# Patient Record
Sex: Male | Born: 1955
Health system: Southern US, Community
[De-identification: ages and names within clinical notes are randomized; demographics above are authoritative.]

## PROBLEM LIST (undated history)

## (undated) DIAGNOSIS — I2510 Atherosclerotic heart disease of native coronary artery without angina pectoris: Secondary | ICD-10-CM

## (undated) DIAGNOSIS — E785 Hyperlipidemia, unspecified: Secondary | ICD-10-CM

## (undated) DIAGNOSIS — Z9109 Other allergy status, other than to drugs and biological substances: Secondary | ICD-10-CM

## (undated) DIAGNOSIS — J849 Interstitial pulmonary disease, unspecified: Secondary | ICD-10-CM

## (undated) DIAGNOSIS — I10 Essential (primary) hypertension: Secondary | ICD-10-CM

## (undated) DIAGNOSIS — H3530 Unspecified macular degeneration: Secondary | ICD-10-CM

## (undated) DIAGNOSIS — J45909 Unspecified asthma, uncomplicated: Secondary | ICD-10-CM

## (undated) DIAGNOSIS — J449 Chronic obstructive pulmonary disease, unspecified: Secondary | ICD-10-CM

## (undated) DIAGNOSIS — R0600 Dyspnea, unspecified: Secondary | ICD-10-CM

## (undated) DIAGNOSIS — Z9981 Dependence on supplemental oxygen: Secondary | ICD-10-CM

## (undated) DIAGNOSIS — K219 Gastro-esophageal reflux disease without esophagitis: Secondary | ICD-10-CM

## (undated) HISTORY — DX: Other allergy status, other than to drugs and biological substances: Z91.09

## (undated) HISTORY — DX: Unspecified asthma, uncomplicated: J45.909

## (undated) HISTORY — PX: SPINE SURGERY: SHX786

## (undated) HISTORY — PX: MULTIPLE TOOTH EXTRACTIONS: SHX2053

## (undated) HISTORY — PX: TONSILLECTOMY: SUR1361

## (undated) HISTORY — PX: CHOLECYSTECTOMY: SHX55

---

## 2019-11-08 DIAGNOSIS — E7800 Pure hypercholesterolemia, unspecified: Secondary | ICD-10-CM | POA: Insufficient documentation

## 2019-11-08 DIAGNOSIS — R7989 Other specified abnormal findings of blood chemistry: Secondary | ICD-10-CM | POA: Insufficient documentation

## 2019-11-08 DIAGNOSIS — J4520 Mild intermittent asthma, uncomplicated: Secondary | ICD-10-CM | POA: Insufficient documentation

## 2021-06-06 ENCOUNTER — Telehealth: Payer: Self-pay | Admitting: Pulmonary Disease

## 2021-06-06 ENCOUNTER — Encounter: Payer: Self-pay | Admitting: Pulmonary Disease

## 2021-06-06 ENCOUNTER — Other Ambulatory Visit: Payer: Self-pay

## 2021-06-06 ENCOUNTER — Ambulatory Visit (INDEPENDENT_AMBULATORY_CARE_PROVIDER_SITE_OTHER): Payer: 59 | Admitting: Pulmonary Disease

## 2021-06-06 VITALS — BP 116/60 | HR 108 | Ht 74.0 in | Wt 230.0 lb

## 2021-06-06 DIAGNOSIS — J4540 Moderate persistent asthma, uncomplicated: Secondary | ICD-10-CM | POA: Diagnosis not present

## 2021-06-06 DIAGNOSIS — R0600 Dyspnea, unspecified: Secondary | ICD-10-CM

## 2021-06-06 DIAGNOSIS — R0602 Shortness of breath: Secondary | ICD-10-CM

## 2021-06-06 DIAGNOSIS — R0609 Other forms of dyspnea: Secondary | ICD-10-CM

## 2021-06-06 DIAGNOSIS — R0789 Other chest pain: Secondary | ICD-10-CM

## 2021-06-06 DIAGNOSIS — L590 Erythema ab igne [dermatitis ab igne]: Secondary | ICD-10-CM | POA: Diagnosis not present

## 2021-06-06 DIAGNOSIS — R109 Unspecified abdominal pain: Secondary | ICD-10-CM | POA: Diagnosis not present

## 2021-06-06 MED ORDER — ALBUTEROL SULFATE HFA 108 (90 BASE) MCG/ACT IN AERS: 2.0000 | INHALATION_SPRAY | Freq: Four times a day (QID) | RESPIRATORY_TRACT | 6 refills | Status: DC | PRN

## 2021-06-06 MED ORDER — ADVAIR HFA 115-21 MCG/ACT IN AERO: 2.0000 | INHALATION_SPRAY | Freq: Two times a day (BID) | RESPIRATORY_TRACT | 12 refills | Status: DC

## 2021-06-06 NOTE — Telephone Encounter (Signed)
Called and spoke with pt who stated the Advair HFA was going to cost him a little over $100 which he said is too expensive for him. Stated to pt to call insurance company to have them provide him a formulary list of covered inhalers so we can then discuss this with Dr. Tonia Brooms and he verbalized understanding. Nothing further needed.

## 2021-06-06 NOTE — Patient Instructions (Signed)
  Thank you for visiting Dr. Tonia Brooms at [facility]. Today we recommend the following:  No orders of the defined types were placed in this encounter.  No orders of the defined types were placed in this encounter.  Return in about 3 months (around 09/06/2021) for with APP or Dr. Tonia Brooms.    Please do your part to reduce the spread of COVID-19.

## 2021-06-06 NOTE — Progress Notes (Signed)
Synopsis: Referred in June 2022 for self referral, asthma PCP:  by No ref. provider found  Subjective:   PATIENT ID: Mark Doyle GENDER: male DOB: Nov 14, 1956, MRN: [id number]  Chief Complaint  Patient presents with   Consult    Chronic cough, DOE, getting worse over the last year    PMH asthma, seasonal allergies, HTN, never smoker. He is SOB when he walks. He uses his sister nebulizer and OTC primatime mist.  Here today to establish care with new primary pulmonologist.  He does not have a primary care doctor.  He does have ongoing symptoms of dyspnea on exertion.  Nocturnal cough.  He has had asthma for a long time.  Still using over-the-counter Primatene Mist.  And using his sister's nebulizer machine on occasion.  He does not have any maintenance inhalers or no albuterol to use at home.  He does have seasonal allergies.  No other significant past medical history.  Please see below.  Patient had a coughing episode a few weeks ago developed right-sided abdominal and flank pain.  He feels like he pulled a muscle its been sore.  He has been using a heating pad regularly.    Past Medical History:  Diagnosis Date   Asthma    Environmental allergies      Family History  Problem Relation Age of Onset   Asthma Mother    Heart disease Father    Stroke Father      Past Surgical History:  Procedure Laterality Date   CHOLECYSTECTOMY     SPINE SURGERY     TONSILLECTOMY      Social History   Socioeconomic History   Marital status: Married    Spouse name: Not on file   Number of children: Not on file   Years of education: Not on file   Highest education level: Not on file  Occupational History   Not on file  Tobacco Use   Smoking status: Never   Smokeless tobacco: Current    Types: Chew  Vaping Use   Vaping Use: Never used  Substance and Sexual Activity   Alcohol use: Yes    Alcohol/week: 12.0 standard drinks    Types: 12 Cans of beer per week   Drug use: Not on file    Sexual activity: Not Currently  Other Topics Concern   Not on file  Social History Narrative   Not on file   Social Determinants of Health   Financial Resource Strain: Not on file  Food Insecurity: Not on file  Transportation Needs: Not on file  Physical Activity: Not on file  Stress: Not on file  Social Connections: Not on file  Intimate Partner Violence: Not on file     No Known Allergies   Outpatient Medications Prior to Visit  Medication Sig Dispense Refill   aspirin 81 MG chewable tablet Chew by mouth.     Multiple Vitamins-Minerals (ZINC PO) Take by mouth.     Omega-3 Fatty Acids (FISH OIL PO) Take by mouth.     omeprazole (PRILOSEC) 20 MG capsule Take by mouth.     VITAMIN D PO Take by mouth.     No facility-administered medications prior to visit.    Review of Systems  Constitutional:  Negative for chills, fever, malaise/fatigue and weight loss.  HENT:  Negative for hearing loss, sore throat and tinnitus.   Eyes:  Negative for blurred vision and double vision.  Respiratory:  Positive for cough, sputum production and shortness of  breath. Negative for hemoptysis, wheezing and stridor.   Cardiovascular:  Negative for chest pain, palpitations, orthopnea, leg swelling and PND.  Gastrointestinal:  Negative for abdominal pain, constipation, diarrhea, heartburn, nausea and vomiting.  Genitourinary:  Negative for dysuria, hematuria and urgency.  Musculoskeletal:  Negative for joint pain and myalgias.  Skin:  Negative for itching and rash.  Neurological:  Negative for dizziness, tingling, weakness and headaches.  Endo/Heme/Allergies:  Negative for environmental allergies. Does not bruise/bleed easily.  Psychiatric/Behavioral:  Negative for depression. The patient is not nervous/anxious and does not have insomnia.   All other systems reviewed and are negative.   Objective:  Physical Exam Vitals reviewed.  Constitutional:      General: He is not in acute distress.     Appearance: He is well-developed.  HENT:     Head: Normocephalic and atraumatic.  Eyes:     General: No scleral icterus.    Conjunctiva/sclera: Conjunctivae normal.     Pupils: Pupils are equal, round, and reactive to light.  Neck:     Vascular: No JVD.     Trachea: No tracheal deviation.  Cardiovascular:     Rate and Rhythm: Normal rate and regular rhythm.     Heart sounds: Normal heart sounds. No murmur heard. Pulmonary:     Effort: Pulmonary effort is normal. No tachypnea, accessory muscle usage or respiratory distress.     Breath sounds: Normal breath sounds. No stridor. No wheezing, rhonchi or rales.  Abdominal:     General: Bowel sounds are normal. There is no distension.     Palpations: Abdomen is soft.     Tenderness: There is no abdominal tenderness.  Musculoskeletal:        General: No tenderness.     Cervical back: Neck supple.  Lymphadenopathy:     Cervical: No cervical adenopathy.  Skin:    General: Skin is warm and dry.     Capillary Refill: Capillary refill takes less than 2 seconds.     Findings: No rash.     Comments: Erythema ab igne  along the right lateral flank.  Neurological:     Mental Status: He is alert and oriented to person, place, and time.  Psychiatric:        Behavior: Behavior normal.     Vitals:   06/06/21 1002  BP: 116/60  Pulse: (!) 108  SpO2: 98%  Weight: 230 lb (104.3 kg)  Height: 6\' 2"  (1.88 m)   98% on RA BMI Readings from Last 3 Encounters:  06/06/21 29.53 kg/m   Wt Readings from Last 3 Encounters:  06/06/21 230 lb (104.3 kg)     CBC No results found for: WBC, RBC, HGB, HCT, PLT, MCV, MCH, MCHC, RDW, LYMPHSABS, MONOABS, EOSABS, BASOSABS    Chest Imaging:   Pulmonary Functions Testing Results: No flowsheet data found.  FeNO:   Pathology:   Echocardiogram:   Heart Catheterization:     Assessment & Plan:     ICD-10-CM   1. Moderate persistent asthma without complication  J45.40 Pulmonary Function Test     Ambulatory Referral for DME    Ambulatory referral to [facility]    2. Erythema ab igne  L59.0     3. SOB (shortness of breath)  R06.02     4. Right flank pain  R10.9     5. DOE (dyspnea on exertion)  R06.00     6. Chest tightness  R07.89       Discussion:  This is  a 65 year old gentleman, moderate persistent asthma no prior PFTs.  Does not have a primary care provider.  He has right-sided flank pain that he has been treating with a heating pad and has skin changes noted to this effect.  Overall he says it is getting better.  Plan: For his asthma we will start him on Advair HFA As needed albuterol prescription Albuterol nebulizer plus new machine and tubing supplies from DME company. Full PFTs have been ordered. Plan to follow-up in 3 months with me or APP after PFTs complete.  Referral placed to primary care.    Current Outpatient Medications:    aspirin 81 MG chewable tablet, Chew by mouth., Disp: , Rfl:    Multiple Vitamins-Minerals (ZINC PO), Take by mouth., Disp: , Rfl:    Omega-3 Fatty Acids (FISH OIL PO), Take by mouth., Disp: , Rfl:    omeprazole (PRILOSEC) 20 MG capsule, Take by mouth., Disp: , Rfl:    VITAMIN D PO, Take by mouth., Disp: , Rfl:     [name], DO [place] [facility] 06/06/2021 10:21 AM

## 2021-06-14 ENCOUNTER — Telehealth: Payer: Self-pay | Admitting: Pulmonary Disease

## 2021-06-14 MED ORDER — ALBUTEROL SULFATE (2.5 MG/3ML) 0.083% IN NEBU: 2.5000 mg | INHALATION_SOLUTION | Freq: Four times a day (QID) | RESPIRATORY_TRACT | 5 refills | Status: DC | PRN

## 2021-06-14 NOTE — Telephone Encounter (Signed)
Pt stated that he never got the medicine that goes with his nebulizer and he is requesting that an rx be sent for him.   Pharmacy; [id number] - [place], [place] - [address]   Pls regard; [phone]

## 2021-06-14 NOTE — Telephone Encounter (Signed)
Spoke with the pt  He states still waiting for neb machine from [employer]  Albuterol neb sol never sent to pharm  He requested that this go to [facility]  Rx sent

## 2021-07-09 ENCOUNTER — Ambulatory Visit (HOSPITAL_BASED_OUTPATIENT_CLINIC_OR_DEPARTMENT_OTHER)
Admission: RE | Admit: 2021-07-09 | Discharge: 2021-07-09 | Disposition: A | Discharge status: Home / Self Care | Payer: 59 | Source: Ambulatory Visit | Attending: Family | Admitting: Family

## 2021-07-09 ENCOUNTER — Other Ambulatory Visit: Payer: Self-pay

## 2021-07-09 ENCOUNTER — Other Ambulatory Visit: Payer: Self-pay | Admitting: Family

## 2021-07-09 ENCOUNTER — Encounter: Payer: Self-pay | Admitting: Family

## 2021-07-09 ENCOUNTER — Ambulatory Visit (INDEPENDENT_AMBULATORY_CARE_PROVIDER_SITE_OTHER): Payer: 59 | Admitting: Family

## 2021-07-09 VITALS — BP 124/90 | HR 94 | Temp 98.1°F | Resp 94 | Ht 73.0 in | Wt 230.2 lb

## 2021-07-09 DIAGNOSIS — R0600 Dyspnea, unspecified: Secondary | ICD-10-CM | POA: Diagnosis not present

## 2021-07-09 DIAGNOSIS — R0602 Shortness of breath: Secondary | ICD-10-CM

## 2021-07-09 DIAGNOSIS — Z13220 Encounter for screening for lipoid disorders: Secondary | ICD-10-CM | POA: Diagnosis not present

## 2021-07-09 DIAGNOSIS — R899 Unspecified abnormal finding in specimens from other organs, systems and tissues: Secondary | ICD-10-CM

## 2021-07-09 DIAGNOSIS — Z125 Encounter for screening for malignant neoplasm of prostate: Secondary | ICD-10-CM

## 2021-07-09 DIAGNOSIS — R0609 Other forms of dyspnea: Secondary | ICD-10-CM

## 2021-07-09 DIAGNOSIS — R194 Change in bowel habit: Secondary | ICD-10-CM

## 2021-07-09 LAB — CBC WITH DIFFERENTIAL/PLATELET
Basophils Absolute: 0.3 10*3/uL — ABNORMAL HIGH (ref 0.0–0.1)
Basophils Relative: 4.3 % — ABNORMAL HIGH (ref 0.0–3.0)
Eosinophils Absolute: 0.5 10*3/uL (ref 0.0–0.7)
Eosinophils Relative: 7.5 % — ABNORMAL HIGH (ref 0.0–5.0)
HCT: 45 % (ref 39.0–52.0)
Hemoglobin: 15.4 g/dL (ref 13.0–17.0)
Lymphocytes Relative: 14.6 % (ref 12.0–46.0)
Lymphs Abs: 0.9 10*3/uL (ref 0.7–4.0)
MCHC: 34.3 g/dL (ref 30.0–36.0)
MCV: 96.6 fl (ref 78.0–100.0)
Monocytes Absolute: 0.8 10*3/uL (ref 0.1–1.0)
Monocytes Relative: 12.5 % — ABNORMAL HIGH (ref 3.0–12.0)
Neutro Abs: 3.9 10*3/uL (ref 1.4–7.7)
Neutrophils Relative %: 61.1 % (ref 43.0–77.0)
Platelets: 254 10*3/uL (ref 150.0–400.0)
RBC: 4.65 Mil/uL (ref 4.22–5.81)
RDW: 13.4 % (ref 11.5–15.5)
WBC: 6.4 10*3/uL (ref 4.0–10.5)

## 2021-07-09 LAB — COMPREHENSIVE METABOLIC PANEL
ALT: 35 U/L (ref 0–53)
AST: 31 U/L (ref 0–37)
Albumin: 4.3 g/dL (ref 3.5–5.2)
Alkaline Phosphatase: 86 U/L (ref 39–117)
BUN: 8 mg/dL (ref 6–23)
CO2: 29 mEq/L (ref 19–32)
Calcium: 9.2 mg/dL (ref 8.4–10.5)
Chloride: 99 mEq/L (ref 96–112)
Creatinine, Ser: 0.76 mg/dL (ref 0.40–1.50)
GFR: 94.71 mL/min (ref >60.00)
Glucose, Bld: 99 mg/dL (ref 70–99)
Potassium: 4.7 mEq/L (ref 3.5–5.1)
Sodium: 136 mEq/L (ref 135–145)
Total Bilirubin: 0.7 mg/dL (ref 0.2–1.2)
Total Protein: 7 g/dL (ref 6.0–8.3)

## 2021-07-09 LAB — LIPID PANEL
Cholesterol: 189 mg/dL (ref 0–200)
HDL: 61.1 mg/dL (ref >39.00)
LDL Cholesterol: 117 mg/dL — ABNORMAL HIGH (ref 0–99)
NonHDL: 128.29
Total CHOL/HDL Ratio: 3
Triglycerides: 54 mg/dL (ref 0.0–149.0)
VLDL: 10.8 mg/dL (ref 0.0–40.0)

## 2021-07-09 LAB — PSA: PSA: 4.01 ng/mL — ABNORMAL HIGH (ref 0.10–4.00)

## 2021-07-09 NOTE — Progress Notes (Signed)
CXR does not show any acute concerning changes;

## 2021-07-09 NOTE — Progress Notes (Signed)
Mark Doyle is a 65 y.o. male with the following history as recorded in EpicCare:  Patient Active Problem List   Diagnosis Date Noted   Elevated LFTs 11/08/2019   Mild intermittent asthma without complication 24/49/7530   Pure hypercholesterolemia 11/08/2019    Current Outpatient Medications  Medication Sig Dispense Refill   albuterol (PROVENTIL) (2.5 MG/3ML) 0.083% nebulizer solution Take 3 mLs (2.5 mg total) by nebulization every 6 (six) hours as needed for wheezing or shortness of breath. 120 mL 5   albuterol (VENTOLIN HFA) 108 (90 Base) MCG/ACT inhaler Inhale 2 puffs into the lungs every 6 (six) hours as needed for wheezing or shortness of breath. 8 g 6   aspirin 81 MG chewable tablet Chew by mouth.     Multiple Vitamins-Minerals (ZINC PO) Take by mouth.     Omega-3 Fatty Acids (FISH OIL PO) Take by mouth.     omeprazole (PRILOSEC) 20 MG capsule Take by mouth.     VITAMIN D PO Take by mouth.     No current facility-administered medications for this visit.    Allergies: Patient has no known allergies.  Past Medical History:  Diagnosis Date   Asthma    Environmental allergies     Past Surgical History:  Procedure Laterality Date   CHOLECYSTECTOMY     SPINE SURGERY     TONSILLECTOMY      Family History  Problem Relation Age of Onset   Asthma Mother    Heart disease Father    Stroke Father     Social History   Tobacco Use   Smoking status: Never   Smokeless tobacco: Current    Types: Chew  Substance Use Topics   Alcohol use: Yes    Alcohol/week: 12.0 standard drinks    Types: 12 Cans of beer per week    Subjective:  Patient presents today as a new patient- was told by his pulmonologist that he needed to establish with PCP.  History of asthma- under care of pulmonology; notes he is struggling with shortness of breath on exertion; has coughing spells that trigger "dizzy spells." Notes a few months ago, he was coughing so hard, he passed out. Notes that he cannot  afford the Advair that has been prescribed for asthma management; has not been able to find an alternative that he could afford; not currently taking a daily preventive medication for asthma; Per patient, he has been prescribed a statin in the past but did not take the medication consistently.    Objective:  Vitals:   07/09/21 1039  BP: 124/90  Pulse: 94  Resp: (!) 94  Temp: 98.1 F (36.7 C)  TempSrc: Oral  Weight: 230 lb 3.2 oz (104.4 kg)  Height: _0  (1.854 m)    General: Well developed, well nourished, in no acute distress  Skin : Warm and dry.  Head: Normocephalic and atraumatic  Eyes: Sclera and conjunctiva clear; pupils round and reactive to light; extraocular movements intact  Ears: External normal; canals clear; tympanic membranes normal  Oropharynx: Pink, supple. No suspicious lesions  Neck: Supple without thyromegaly, adenopathy  Lungs: Respirations unlabored; clear to auscultation bilaterally without wheeze, rales, rhonchi  CVS exam: normal rate and regular rhythm.  Musculoskeletal: No deformities; no active joint inflammation  Neurologic: Alert and oriented; speech intact; face symmetrical; moves all extremities well; CNII-XII intact without focal deficit   Assessment:  1. Shortness of breath   2. DOE (dyspnea on exertion)   3. Lipid screening   4.  Prostate cancer screening   5. Bowel habit changes     Plan:  & 2. Uncontrolled asthma- he is given GoodRx card to check on cost of Advair; he is also encouraged to reach back out to his asthma specialist to discuss medication options; update CXR per patient request; Update EKG- sinus rhythm; refer to cardiology as well as encouraging to go back to his pulmonologist; not convinced that asthma is the only source of the symptoms. Check lipid panel; to consider re-starting statin; Check PSA; Refer to GI for further evaluation- patient thinks he is due for colonoscopy;   This visit occurred during the SARS-CoV-2 public  health emergency.  Safety protocols were in place, including screening questions prior to the visit, additional usage of staff PPE, and extensive cleaning of exam room while observing appropriate contact time as indicated for disinfecting solutions.    No follow-ups on file.  Orders Placed This Encounter  Procedures   DG Chest 2 View    Standing Status:   Future    Number of Occurrences:   1    Standing Expiration Date:   07/09/2022    Order Specific Question:   Reason for Exam (SYMPTOM  OR DIAGNOSIS REQUIRED)    Answer:   shortness of breath    Order Specific Question:   Preferred imaging location?    Answer:   [facility] [place] [facility]   CBC with Differential/Platelet   Comp Met (CMET)   Lipid panel   PSA   Ambulatory referral to Cardiology    Referral Priority:   Routine    Referral Type:   Consultation    Referral Reason:   Specialty Services Required    Requested Specialty:   Cardiology    Number of Visits Requested:   1   Ambulatory referral to Gastroenterology    Referral Priority:   Routine    Referral Type:   Consultation    Referral Reason:   Specialty Services Required    Number of Visits Requested:   1   EKG 12-Lead    Requested Prescriptions    No prescriptions requested or ordered in this encounter

## 2021-07-11 ENCOUNTER — Telehealth: Payer: Self-pay | Admitting: Pulmonary Disease

## 2021-07-11 NOTE — Telephone Encounter (Signed)
Spoke with the pt  He states finally able to get his advair hfa 115 mcg for $95  His family helped him pay for it  He states 1 inhaler only comes with 46 puffs in it and he is prescribed to use it 2 bid  He already started using inhaler so unable to take it back  Do you think he could use 1 puff bid? Or have any other recs?

## 2021-07-12 ENCOUNTER — Other Ambulatory Visit: Payer: Self-pay | Admitting: Family

## 2021-07-12 ENCOUNTER — Telehealth: Payer: Self-pay | Admitting: Family

## 2021-07-12 MED ORDER — ATORVASTATIN CALCIUM 20 MG PO TABS: 20.0000 mg | ORAL_TABLET | Freq: Every day | ORAL | 0 refills | Status: DC

## 2021-07-12 NOTE — Telephone Encounter (Signed)
Pt is okay with starting with the medication recommended.

## 2021-07-12 NOTE — Telephone Encounter (Signed)
Patient states he would like to go ahead an start [id number] - [place], [place] - [address] Phone:  [phone]  Fax:  [phone]

## 2021-07-15 NOTE — Telephone Encounter (Signed)
Spoke with pt and reviewed Dr. Myrlene Broker recommendations of Advair BID. Pt states understanding. When offering pt asstistance pt said he had already been denied for that. Pt did state he would new insurance starting 07/22/21 and Advair will be covered by it. Pt also scheduled f/u OV with Dr. Tonia Brooms in Sept. Nothing further needed at this time.

## 2021-07-22 ENCOUNTER — Telehealth: Payer: Self-pay | Admitting: Pulmonary Disease

## 2021-07-22 MED ORDER — FLUTICASONE FUROATE-VILANTEROL 100-25 MCG/INH IN AEPB: 1.0000 | INHALATION_SPRAY | Freq: Every day | RESPIRATORY_TRACT | 4 refills | Status: DC

## 2021-07-22 NOTE — Telephone Encounter (Signed)
Can try symbicort 80 two puffs BID with Spacer   Or Breo 100 once daily if insurance doesn't approve other   Needs to rinse mouth afterwards and gargle   Thanks  [name], DO [place] [facility] 07/22/2021 3:10 PM

## 2021-07-22 NOTE — Telephone Encounter (Signed)
Called and spoke with patient. I was able to check his formulary, he just switched to [place]. Virgel Bouquet is covered at $47 a month. He verbalized understanding and wishes to start the [facility].   RX has been sent. He is aware of how to use the [facility].   Nothing further needed at time of call.

## 2021-07-22 NOTE — Telephone Encounter (Signed)
Called and spoke with patient. He stated that he had been on the Advair 115-57mcg for about 2 weeks and noticed that his throat has been sore. Each time he used the inhaler, his throat would get worse. His throat is at the point of being raw now. He stopped using the Advair yesterday and noticed that his throat is starting to feel a little better.   He denied any throat swelling or increased SOB. He does have a productive cough but the phlegm has been clear.   He wants to know if there another inhaler that he can use in place of the Advair. He did state that he was rinsing his mouth out after each use with the Advair.   Pharmacy is [facility] [place].   BI, can you please advise? Thanks!

## 2021-08-01 ENCOUNTER — Other Ambulatory Visit: Payer: Self-pay

## 2021-08-01 DIAGNOSIS — Z9109 Other allergy status, other than to drugs and biological substances: Secondary | ICD-10-CM | POA: Insufficient documentation

## 2021-08-01 DIAGNOSIS — J45909 Unspecified asthma, uncomplicated: Secondary | ICD-10-CM | POA: Insufficient documentation

## 2021-08-02 ENCOUNTER — Other Ambulatory Visit (INDEPENDENT_AMBULATORY_CARE_PROVIDER_SITE_OTHER): Payer: Medicare HMO

## 2021-08-02 ENCOUNTER — Other Ambulatory Visit: Payer: Self-pay

## 2021-08-02 ENCOUNTER — Ambulatory Visit (INDEPENDENT_AMBULATORY_CARE_PROVIDER_SITE_OTHER): Payer: Medicare HMO | Admitting: Cardiology

## 2021-08-02 ENCOUNTER — Encounter: Payer: Self-pay | Admitting: Cardiology

## 2021-08-02 VITALS — BP 134/86 | HR 86 | Ht 74.0 in | Wt 231.0 lb

## 2021-08-02 DIAGNOSIS — R899 Unspecified abnormal finding in specimens from other organs, systems and tissues: Secondary | ICD-10-CM | POA: Diagnosis not present

## 2021-08-02 DIAGNOSIS — J4520 Mild intermittent asthma, uncomplicated: Secondary | ICD-10-CM | POA: Diagnosis not present

## 2021-08-02 DIAGNOSIS — R0789 Other chest pain: Secondary | ICD-10-CM

## 2021-08-02 DIAGNOSIS — R0600 Dyspnea, unspecified: Secondary | ICD-10-CM | POA: Diagnosis not present

## 2021-08-02 DIAGNOSIS — R0609 Other forms of dyspnea: Secondary | ICD-10-CM

## 2021-08-02 DIAGNOSIS — E7800 Pure hypercholesterolemia, unspecified: Secondary | ICD-10-CM

## 2021-08-02 LAB — PSA: PSA: 1.01 ng/mL (ref 0.10–4.00)

## 2021-08-02 NOTE — Progress Notes (Signed)
Cardiology Consultation:    Date:  08/02/2021   ID:  Keturah Barre, DOB 1956/02/11, MRN [id number]  PCP:  Olive Bass, FNP  Cardiologist:  Gypsy Balsam, MD   Referring MD: Olive Bass,*   Chief Complaint  Patient presents with   Shortness of Breath         History of Present Illness:    Mark Doyle is a 65 y.o. male who is being seen today for the evaluation of shortness of breath at the request of Olive Bass,*.  With past medical history significant for dyslipidemia, he was referred to [place] because of progressive shortness of breath interestingly he tells me that he been short of breath for many years however within the last 6 months or so shortness of breath really become bothersome.  He said walking to the mailbox he has to stop sometimes because of shortness of breath.  He also cannot bend forward to tie his shoe because she can he get short of breath.  Sometimes shortness of breath is associated with some uneasy sensation in the chest.  He was given some bronchodilators by pulmonary doctor with limited response.  Denies having any typical chest pain tightness squeezing pressure burning chest. He does have dyslipidemia that has been treated recently with Lipitor. 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  Past Medical History:  Diagnosis Date   Asthma    Environmental allergies     Past Surgical History:  Procedure Laterality Date   CHOLECYSTECTOMY     SPINE SURGERY     TONSILLECTOMY      Current Medications: Current Meds  Medication Sig   ADVAIR HFA 115-21 MCG/ACT inhaler Inhale 2 puffs into the lungs as needed for shortness of breath or wheezing.   albuterol (PROVENTIL) (2.5 MG/3ML) 0.083% nebulizer solution Take 3 mLs (2.5  mg total) by nebulization every 6 (six) hours as needed for wheezing or shortness of breath.   albuterol (VENTOLIN HFA) 108 (90 Base) MCG/ACT inhaler Inhale 2 puffs into the lungs every 6 (six) hours as needed for wheezing or shortness of breath.   aspirin 81 MG chewable tablet Chew 81 mg by mouth daily.   atorvastatin (LIPITOR) 20 MG tablet Take 1 tablet (20 mg total) by mouth daily.   fluticasone furoate-vilanterol (BREO ELLIPTA) 100-25 MCG/INH AEPB Inhale 1 puff into the lungs daily.   Multiple Vitamins-Minerals (ZINC PO) Take 1 tablet by mouth daily. Unknown strenght   Omega-3 Fatty Acids (FISH OIL PO) Take 1 tablet by mouth daily.   omeprazole (PRILOSEC) 20 MG capsule Take 20 mg by mouth daily.   VITAMIN D PO Take 1 tablet by mouth daily. Unknown strength     Allergies:   Patient has no known allergies.   Social History   Socioeconomic History   Marital status: Married    Spouse name: Not on file   Number of children: Not on file   Years of education: Not on file   Highest education level: Not on file  Occupational History   Not on file  Tobacco Use   Smoking status: Never   Smokeless tobacco: Current    Types: Chew  Vaping Use   Vaping Use: Never used  Substance and Sexual Activity   Alcohol use: Yes  Alcohol/week: 12.0 standard drinks    Types: 12 Cans of beer per week   Drug use: Not on file   Sexual activity: Not Currently  Other Topics Concern   Not on file  Social History Narrative   Not on file   Social Determinants of Health   Financial Resource Strain: Not on file  Food Insecurity: Not on file  Transportation Needs: Not on file  Physical Activity: Not on file  Stress: Not on file  Social Connections: Not on file     Family History: The patient's family history includes Asthma in his mother; Heart disease in his father; Stroke in his father. ROS:   Please see the history of present illness.    All 14 point review of systems negative except as  described per history of present illness.  EKGs/Labs/Other Studies Reviewed:    The following studies were reviewed today:   EKG:  EKG is  ordered today.  The ekg ordered today demonstrates normal sinus rhythm, low voltage QRS, Q waves anteriorly cannot rule out anteroseptal MI, nonspecific ST segment changes  Recent Labs: 07/09/2021: ALT 35; BUN 8; Creatinine, Ser 0.76; Hemoglobin 15.4; Platelets 254.0; Potassium 4.7; Sodium 136  Recent Lipid Panel    Component Value Date/Time   CHOL 189 07/09/2021 1121   TRIG 54.0 07/09/2021 1121   HDL 61.10 07/09/2021 1121   CHOLHDL 3 07/09/2021 1121   VLDL 10.8 07/09/2021 1121   LDLCALC 117 (H) 07/09/2021 1121    Physical Exam:    VS:  BP 134/86 (BP Location: Right Arm, Patient Position: Sitting)   Pulse 86   Ht 6\' 2"  (1.88 m)   Wt 231 lb (104.8 kg)   SpO2 91%   BMI 29.66 kg/m     Wt Readings from Last 3 Encounters:  08/02/21 231 lb (104.8 kg)  07/09/21 230 lb 3.2 oz (104.4 kg)  06/06/21 230 lb (104.3 kg)     GEN:  Well nourished, well developed in no acute distress HEENT: Normal NECK: No JVD; No carotid bruits LYMPHATICS: No lymphadenopathy CARDIAC: RRR, no murmurs, no rubs, no gallops RESPIRATORY:  Clear to auscultation without rales, wheezing or rhonchi  ABDOMEN: Soft, non-tender, non-distended MUSCULOSKELETAL:  No edema; No deformity  SKIN: Warm and dry NEUROLOGIC:  Alert and oriented x 3 PSYCHIATRIC:  Normal affect   ASSESSMENT:    1. Pure hypercholesterolemia   2. Dyspnea on exertion   3. Mild intermittent asthma, unspecified whether complicated    PLAN:    In order of problems listed above:  Dyspnea on exertion obviously concerning.  He has been seeing pulmonary doctor already.  I will do cardiac work-up to make sure there is no cardiac etiology for his symptomatology.  He will have echocardiogram done to assess left ventricular ejection fraction pulmonary pressure.  He may be having angina equivalent.  Therefore,  I will ask him to have a stress test we will do Lexiscan.  He is already on aspirin as well as statin which I will continue. Dyslipidemia recently he was started with Lipitor.  His LDL before that was 117 HDL 61 this is from 07/09/2021.  I Mild intermittent asthma.  That being followed by pulmonary.   Medication Adjustments/Labs and Tests Ordered: Current medicines are reviewed at length with the patient today.  Concerns regarding medicines are outlined above.  No orders of the defined types were placed in this encounter.  No orders of the defined types were placed in this encounter.   Signed,  Georgeanna Lea, MD, [facility]. 08/02/2021 10:39 AM     [facility]

## 2021-08-02 NOTE — Patient Instructions (Signed)
Medication Instructions:  Your physician recommends that you continue on your current medications as directed. Please refer to the Current Medication list given to you today.  *If you need a refill on your cardiac medications before your next appointment, please call your pharmacy*   Lab Work: None If you have labs (blood work) drawn today and your tests are completely normal, you will receive your results only by: MyChart Message (if you have MyChart) OR A paper copy in the mail If you have any lab test that is abnormal or we need to change your treatment, we will call you to review the results.   Testing/Procedures: Your physician has requested that you have an echocardiogram. Echocardiography is a painless test that uses sound waves to create images of your heart. It provides your doctor with information about the size and shape of your heart and how well your heart's chambers and valves are working. This procedure takes approximately one hour. There are no restrictions for this procedure.  Your physician has requested that you have a lexiscan myoview. For further information please visit [email or web address] Please follow instruction sheet, as given.   The test will take approximately 3 to 4 hours to complete; you may bring reading material.  If someone comes with you to your appointment, they will need to remain in the main lobby due to limited space in the testing area. **If you are pregnant or breastfeeding, please notify the nuclear lab prior to your appointment**  How to prepare for your Myocardial Perfusion Test: Do not eat or drink 3 hours prior to your test, except you may have water. Do not consume products containing caffeine (regular or decaffeinated) 12 hours prior to your test. (ex: coffee, chocolate, sodas, tea). Do bring a list of your current medications with you.  If not listed below, you may take your medications as normal. Do wear comfortable clothes (no dresses or  overalls) and walking shoes, tennis shoes preferred (No heels or open toe shoes are allowed). Do NOT wear cologne, perfume, aftershave, or lotions (deodorant is allowed). If these instructions are not followed, your test will have to be rescheduled.    Follow-Up: At [facility], you and your health needs are our priority.  As part of our continuing mission to provide you with exceptional heart care, we have created designated Provider Care Teams.  These Care Teams include your primary Cardiologist (physician) and Advanced Practice Providers (APPs -  Physician Assistants and Nurse Practitioners) who all work together to provide you with the care you need, when you need it.  We recommend signing up for the patient portal called "MyChart".  Sign up information is provided on this After Visit Summary.  MyChart is used to connect with patients for Virtual Visits (Telemedicine).  Patients are able to view lab/test results, encounter notes, upcoming appointments, etc.  Non-urgent messages can be sent to your provider as well.   To learn more about what you can do with MyChart, go to [email or web address].    Your next appointment:   2 month(s)  The format for your next appointment:   In Person  Provider:   Gypsy Balsam, MD   Other Instructions Echocardiogram An echocardiogram is a test that uses sound waves (ultrasound) to produce images of the heart. Images from an echocardiogram can provide important information about: Heart size and shape. The size and thickness and movement of your heart's walls. Heart muscle function and strength. Heart valve function or if you have stenosis. Stenosis is  when the heart valves are too narrow. If blood is flowing backward through the heart valves (regurgitation). A tumor or infectious growth around the heart valves. Areas of heart muscle that are not working well because of poor blood flow or injury from a heart attack. Aneurysm detection. An  aneurysm is a weak or damaged part of an artery wall. The wall bulges out from the normal force of blood pumping through the body. Tell a health care provider about: Any allergies you have. All medicines you are taking, including vitamins, herbs, eye drops, creams, and over-the-counter medicines. Any blood disorders you have. Any surgeries you have had. Any medical conditions you have. Whether you are pregnant or may be pregnant. What are the risks? Generally, this is a safe test. However, problems may occur, including an allergic reaction to dye (contrast) that may be used during the test. What happens before the test? No specific preparation is needed. You may eat and drink normally. What happens during the test?  You will take off your clothes from the waist up and put on a hospital gown. Electrodes or electrocardiogram (ECG)patches may be placed on your chest. The electrodes or patches are then connected to a device that monitors your heart rate and rhythm. You will lie down on a table for an ultrasound exam. A gel will be applied to your chest to help sound waves pass through your skin. A handheld device, called a transducer, will be pressed against your chest and moved over your heart. The transducer produces sound waves that travel to your heart and bounce back (or "echo" back) to the transducer. These sound waves will be captured in real-time and changed into images of your heart that can be viewed on a video monitor. The images will be recorded on a computer and reviewed by your health care provider. You may be asked to change positions or hold your breath for a short time. This makes it easier to get different views or better views of your heart. In some cases, you may receive contrast through an IV in one of your veins. This can improve the quality of the pictures from your heart. The procedure may vary among health care providers and hospitals. What can I expect after the test? You  may return to your normal, everyday life, including diet, activities, andmedicines, unless your health care provider tells you not to do that. Follow these instructions at home: It is up to you to get the results of your test. Ask your health care provider, or the department that is doing the test, when your results will be ready. Keep all follow-up visits. This is important. Summary An echocardiogram is a test that uses sound waves (ultrasound) to produce images of the heart. Images from an echocardiogram can provide important information about the size and shape of your heart, heart muscle function, heart valve function, and other possible heart problems. You do not need to do anything to prepare before this test. You may eat and drink normally. After the echocardiogram is completed, you may return to your normal, everyday life, unless your health care provider tells you not to do that. This information is not intended to replace advice given to you by your health care provider. Make sure you discuss any questions you have with your healthcare provider. Document Revised: 07/31/2020 Document Reviewed: 07/31/2020 Elsevier Patient Education  2022 [employer].

## 2021-08-06 ENCOUNTER — Ambulatory Visit: Payer: 59 | Admitting: Gastroenterology

## 2021-08-09 ENCOUNTER — Telehealth (HOSPITAL_COMMUNITY): Payer: Self-pay | Admitting: *Deleted

## 2021-08-09 NOTE — Telephone Encounter (Signed)
Patient given detailed instructions per Myocardial Perfusion Study Information Sheet for the test on 08/16/2021 at 10:15. Patient notified to arrive 15 minutes early and that it is imperative to arrive on time for appointment to keep from having the test rescheduled.  If you need to cancel or reschedule your appointment, please call the office within 24 hours of your appointment. . Patient verbalized understanding.Daneil Dolin

## 2021-08-16 ENCOUNTER — Ambulatory Visit (HOSPITAL_COMMUNITY): Payer: Medicare HMO | Attending: Cardiology

## 2021-08-16 ENCOUNTER — Other Ambulatory Visit: Payer: Self-pay

## 2021-08-16 DIAGNOSIS — R0789 Other chest pain: Secondary | ICD-10-CM | POA: Diagnosis present

## 2021-08-16 LAB — MYOCARDIAL PERFUSION IMAGING
LV dias vol: 65 mL (ref 62–150)
LV sys vol: 24 mL
Nuc Stress EF: 62 %
Peak HR: 103 {beats}/min
Rest HR: 85 {beats}/min
Rest Nuclear Isotope Dose: 10.2 mCi
SDS: 0
SRS: 0
SSS: 0
ST Depression (mm): 0 mm
Stress Nuclear Isotope Dose: 31.5 mCi
TID: 1

## 2021-08-16 MED ORDER — TECHNETIUM TC 99M TETROFOSMIN IV KIT
10.2000 | PACK | Freq: Once | INTRAVENOUS | Status: AC | PRN
  Administered 2021-08-16: 10.2 via INTRAVENOUS
  Filled 2021-08-16: qty 11

## 2021-08-16 MED ORDER — REGADENOSON 0.4 MG/5ML IV SOLN
0.4000 mg | Freq: Once | INTRAVENOUS | Status: AC
  Administered 2021-08-16: 0.4 mg via INTRAVENOUS

## 2021-08-16 MED ORDER — TECHNETIUM TC 99M TETROFOSMIN IV KIT
31.5000 | PACK | Freq: Once | INTRAVENOUS | Status: AC | PRN
  Administered 2021-08-16: 31.5 via INTRAVENOUS
  Filled 2021-08-16: qty 32

## 2021-08-20 ENCOUNTER — Telehealth: Payer: Self-pay

## 2021-08-20 NOTE — Telephone Encounter (Signed)
-----   Message from Robert J Krasowski, MD sent at 08/20/2021  1:59 PM EDT ----- Stress test no exercise-induced Ischemia 

## 2021-08-20 NOTE — Telephone Encounter (Signed)
Left message on patients voicemail to please return our call.   

## 2021-08-20 NOTE — Telephone Encounter (Signed)
Pt on the line returning call for results... please advise

## 2021-08-21 ENCOUNTER — Telehealth: Payer: Self-pay

## 2021-08-21 NOTE — Telephone Encounter (Signed)
Left message on patients voicemail to please return our call.   

## 2021-08-21 NOTE — Telephone Encounter (Signed)
Pt is returning call for results 

## 2021-08-21 NOTE — Telephone Encounter (Signed)
-----   Message from Robert J Krasowski, MD sent at 08/20/2021  1:59 PM EDT ----- Stress test no exercise-induced Ischemia 

## 2021-08-21 NOTE — Telephone Encounter (Signed)
I did not need this encounter. °

## 2021-08-21 NOTE — Telephone Encounter (Signed)
Spoke with patient regarding results and recommendation.  Patient verbalizes understanding and is agreeable to plan of care. Advised patient to call back with any issues or concerns.  

## 2021-08-21 NOTE — Telephone Encounter (Signed)
-----   Message from Georgeanna Lea, MD sent at 08/20/2021  1:59 PM EDT ----- Stress test no exercise-induced Ischemia

## 2021-08-23 ENCOUNTER — Ambulatory Visit: Payer: 59 | Admitting: Pulmonary Disease

## 2021-08-27 ENCOUNTER — Other Ambulatory Visit: Payer: Self-pay

## 2021-08-27 ENCOUNTER — Ambulatory Visit (HOSPITAL_COMMUNITY): Payer: Medicare Other | Attending: Internal Medicine

## 2021-08-27 DIAGNOSIS — R0600 Dyspnea, unspecified: Secondary | ICD-10-CM | POA: Diagnosis not present

## 2021-08-27 DIAGNOSIS — R0609 Other forms of dyspnea: Secondary | ICD-10-CM

## 2021-08-27 LAB — ECHOCARDIOGRAM COMPLETE
Area-P 1/2: 3.91 cm2
S' Lateral: 3.3 cm

## 2021-08-28 ENCOUNTER — Telehealth: Payer: Self-pay

## 2021-08-28 NOTE — Telephone Encounter (Signed)
Spoke with patient regarding results and recommendation.  Patient verbalizes understanding and is agreeable to plan of care. Advised patient to call back with any issues or concerns.  

## 2021-08-28 NOTE — Telephone Encounter (Signed)
-----   Message from Georgeanna Lea, MD sent at 08/28/2021 12:30 PM EDT ----- Cardiogram showed preserved left ventricular ejection fraction, there is enlargement of the ascending aorta 41 mm.  Medical therapy

## 2021-09-19 NOTE — Progress Notes (Signed)
Subjective:   Mark Doyle is a 65 y.o. male who presents for an Initial Medicare Annual Wellness Visit.  Review of Systems     Cardiac Risk Factors include: advanced age (>6men, >75 women);male gender;dyslipidemia;sedentary lifestyle;obesity (BMI >30kg/m2)     Objective:    Today's Vitals   09/23/21 1026  BP: (!) 142/100  Pulse: 85  Resp: 16  Temp: 97.9 F (36.6 C)  TempSrc: Temporal  SpO2: 95%  Weight: 230 lb 9.6 oz (104.6 kg)  Height: 6\' 1"  (1.854 m)   Body mass index is 30.42 kg/m.  Advanced Directives 09/23/2021  Does Patient Have a Medical Advance Directive? No  Would patient like information on creating a medical advance directive? No - Patient declined    Current Medications (verified) Outpatient Encounter Medications as of 09/23/2021  Medication Sig   albuterol (PROVENTIL) (2.5 MG/3ML) 0.083% nebulizer solution Take 3 mLs (2.5 mg total) by nebulization every 6 (six) hours as needed for wheezing or shortness of breath.   albuterol (VENTOLIN HFA) 108 (90 Base) MCG/ACT inhaler Inhale 2 puffs into the lungs every 6 (six) hours as needed for wheezing or shortness of breath.   aspirin 81 MG chewable tablet Chew 81 mg by mouth daily.   atorvastatin (LIPITOR) 20 MG tablet Take 1 tablet (20 mg total) by mouth daily.   Multiple Vitamins-Minerals (ZINC PO) Take 1 tablet by mouth daily. Unknown strenght   Omega-3 Fatty Acids (FISH OIL PO) Take 1 tablet by mouth daily.   omeprazole (PRILOSEC) 20 MG capsule Take 20 mg by mouth daily.   VITAMIN D PO Take 1 tablet by mouth daily. Unknown strength   ADVAIR HFA 115-21 MCG/ACT inhaler Inhale 2 puffs into the lungs as needed for shortness of breath or wheezing. (Patient not taking: Reported on 09/23/2021)   fluticasone furoate-vilanterol (BREO ELLIPTA) 100-25 MCG/INH AEPB Inhale 1 puff into the lungs daily. (Patient not taking: Reported on 09/23/2021)   No facility-administered encounter medications on file as of 09/23/2021.     Allergies (verified) Patient has no known allergies.   History: Past Medical History:  Diagnosis Date   Asthma    Environmental allergies    Past Surgical History:  Procedure Laterality Date   CHOLECYSTECTOMY     SPINE SURGERY     TONSILLECTOMY     Family History  Problem Relation Age of Onset   Asthma Mother    Heart disease Father    Stroke Father    Social History   Socioeconomic History   Marital status: Married    Spouse name: Not on file   Number of children: Not on file   Years of education: Not on file   Highest education level: Not on file  Occupational History   Not on file  Tobacco Use   Smoking status: Never   Smokeless tobacco: Current    Types: Chew  Vaping Use   Vaping Use: Never used  Substance and Sexual Activity   Alcohol use: Yes    Alcohol/week: 12.0 standard drinks    Types: 12 Cans of beer per week    Comment: occasionally   Drug use: Not on file   Sexual activity: Not Currently  Other Topics Concern   Not on file  Social History Narrative   Not on file   Social Determinants of Health   Financial Resource Strain: Medium Risk   Difficulty of Paying Living Expenses: Somewhat hard  Food Insecurity: No Food Insecurity   Worried About Running Out of  Food in the Last Year: Never true   Ran Out of Food in the Last Year: Never true  Transportation Needs: No Transportation Needs   Lack of Transportation (Medical): No   Lack of Transportation (Non-Medical): No  Physical Activity: Inactive   Days of Exercise per Week: 0 days   Minutes of Exercise per Session: 0 min  Stress: Stress Concern Present   Feeling of Stress : To some extent  Social Connections: Socially Isolated   Frequency of Communication with Friends and Family: Once a week   Frequency of Social Gatherings with Friends and Family: Once a week   Attends Religious Services: Never   Diplomatic Services operational officer: No   Attends Engineer, structural: Never    Marital Status: Married    Tobacco Counseling Ready to quit: Not Answered Counseling given: Not Answered   Clinical Intake:  Pre-visit preparation completed: Yes  Pain : No/denies pain     BMI - recorded: 30.42 Nutritional Status: BMI > 30  Obese Nutritional Risks: None Diabetes: No  How often do you need to have someone help you when you read instructions, pamphlets, or other written materials from your doctor or pharmacy?: 1 - Never  Diabetic?No  Interpreter Needed?: No  Information entered by :: Thomasenia Sales LPN   Activities of Daily Living In your present state of health, do you have any difficulty performing the following activities: 09/23/2021  Hearing? N  Vision? N  Difficulty concentrating or making decisions? N  Walking or climbing stairs? N  Dressing or bathing? N  Doing errands, shopping? N  Preparing Food and eating ? N  Using the Toilet? N  In the past six months, have you accidently leaked urine? N  Do you have problems with loss of bowel control? N  Managing your Medications? N  Managing your Finances? N  Housekeeping or managing your Housekeeping? N  Some recent data might be hidden    Patient Care Team: Olive Bass, FNP as PCP - General (Internal Medicine)  Indicate any recent Medical Services you may have received from other than [facility] providers in the past year (date may be approximate).     Assessment:   This is a routine wellness examination for [place].  Hearing/Vision screen Hearing Screening - Comments:: No issues Vision Screening - Comments:: Last eye exam-2-3 years  Dietary issues and exercise activities discussed: Current Exercise Habits: The patient does not participate in regular exercise at present, Exercise limited by: respiratory conditions(s)   Goals Addressed             This Visit's Progress    Patient Stated       Work on breathing issues       Depression Screen PHQ 2/9 Scores 09/23/2021  07/09/2021  PHQ - 2 Score 0 0    Fall Risk Fall Risk  09/23/2021 07/09/2021  Falls in the past year? 1 1  Number falls in past yr: 0 1  Injury with Fall? 0 0  Risk for fall due to : History of fall(s) Impaired balance/gait  Follow up Falls prevention discussed Falls evaluation completed    FALL RISK PREVENTION PERTAINING TO THE HOME:  Any stairs in or around the home? No  Home free of loose throw rugs in walkways, pet beds, electrical cords, etc? Yes  Adequate lighting in your home to reduce risk of falls? Yes   ASSISTIVE DEVICES UTILIZED TO PREVENT FALLS:  Life alert? No  Use of a  cane, walker or w/c? No  Grab bars in the bathroom? No  Shower chair or bench in shower? Yes  Elevated toilet seat or a handicapped toilet? No   TIMED UP AND GO:  Was the test performed? Yes .  Length of time to ambulate 10 feet: 11 sec.   Gait slow and steady without use of assistive device  Cognitive Function:        Immunizations  There is no immunization history on file for this patient.  TDAP status: Due, Education has been provided regarding the importance of this vaccine. Advised may receive this vaccine at local pharmacy or Health Dept. Aware to provide a copy of the vaccination record if obtained from local pharmacy or Health Dept. Verbalized acceptance and understanding.  Flu Vaccine status: Due, Education has been provided regarding the importance of this vaccine. Advised may receive this vaccine at local pharmacy or Health Dept. Aware to provide a copy of the vaccination record if obtained from local pharmacy or Health Dept. Verbalized acceptance and understanding.  Pneumococcal vaccine status: Due, Education has been provided regarding the importance of this vaccine. Advised may receive this vaccine at local pharmacy or Health Dept. Aware to provide a copy of the vaccination record if obtained from local pharmacy or Health Dept. Verbalized acceptance and understanding.  Covid-19  vaccine status: Declined, Education has been provided regarding the importance of this vaccine but patient still declined. Advised may receive this vaccine at local pharmacy or Health Dept.or vaccine clinic. Aware to provide a copy of the vaccination record if obtained from local pharmacy or Health Dept. Verbalized acceptance and understanding.  Qualifies for Shingles Vaccine? Yes   Zostavax completed No   Shingrix Completed?: No.    Education has been provided regarding the importance of this vaccine. Patient has been advised to call insurance company to determine out of pocket expense if they have not yet received this vaccine. Advised may also receive vaccine at local pharmacy or Health Dept. Verbalized acceptance and understanding.  Screening Tests Health Maintenance  Topic Date Due   COVID-19 Vaccine (1) Never done   HIV Screening  Never done   Hepatitis C Screening  Never done   TETANUS/TDAP  Never done   COLONOSCOPY (Pts 45-69yrs Insurance coverage will need to be confirmed)  Never done   Zoster Vaccines- Shingrix (1 of 2) Never done   INFLUENZA VACCINE  Never done   HPV VACCINES  Aged Out    Health Maintenance  Health Maintenance Due  Topic Date Due   COVID-19 Vaccine (1) Never done   HIV Screening  Never done   Hepatitis C Screening  Never done   TETANUS/TDAP  Never done   COLONOSCOPY (Pts 45-29yrs Insurance coverage will need to be confirmed)  Never done   Zoster Vaccines- Shingrix (1 of 2) Never done   INFLUENZA VACCINE  Never done    Colorectal cancer screening. Due for colonoscopy. Patient sates he will call GI when he is ready to schedule.  Lung Cancer Screening: (Low Dose CT Chest recommended if Age 57-80 years, 30 pack-year currently smoking OR have quit w/in 15years.) does not qualify.     Additional Screening:  Hepatitis C Screening: does qualify; Discuss with PCP.  Vision Screening: Recommended annual ophthalmology exams for early detection of glaucoma and  other disorders of the eye. Is the patient up to date with their annual eye exam?  No  Who is the provider or what is the name of the office  in which the patient attends annual eye exams? Unsure-patient plans to make an appt soon   Dental Screening: Recommended annual dental exams for proper oral hygiene  Community Resource Referral / Chronic Care Management: CRR required this visit?  Yes For assistance with utility bill  CCM required this visit?  No      Plan:     I have personally reviewed and noted the following in the patient's chart:   Medical and social history Use of alcohol, tobacco or illicit drugs  Current medications and supplements including opioid prescriptions. Patient is not currently taking opioid prescriptions. Functional ability and status Nutritional status Physical activity Advanced directives List of other physicians Hospitalizations, surgeries, and ER visits in previous 12 months Vitals Screenings to include cognitive, depression, and falls Referrals and appointments  In addition, I have reviewed and discussed with patient certain preventive protocols, quality metrics, and best practice recommendations. A written personalized care plan for preventive services as well as general preventive health recommendations were provided to patient.     Roanna Raider, LPN   76/07/1156  Nurse Health Advisor  Nurse Notes: None

## 2021-09-23 ENCOUNTER — Ambulatory Visit (INDEPENDENT_AMBULATORY_CARE_PROVIDER_SITE_OTHER): Payer: Medicare Other

## 2021-09-23 ENCOUNTER — Other Ambulatory Visit: Payer: Self-pay

## 2021-09-23 VITALS — BP 142/100 | HR 85 | Temp 97.9°F | Resp 16 | Ht 73.0 in | Wt 230.6 lb

## 2021-09-23 DIAGNOSIS — Z0000 Encounter for general adult medical examination without abnormal findings: Secondary | ICD-10-CM | POA: Diagnosis not present

## 2021-09-23 NOTE — Patient Instructions (Signed)
[name] , Thank you for taking time to come for your Medicare Wellness Visit. I appreciate your ongoing commitment to your health goals. Please review the following plan we discussed and let me know if I can assist you in the future.   Screening recommendations/referrals: Colonoscopy: Due-Please call GI when you are ready to schedule. Recommended yearly ophthalmology/optometry visit for glaucoma screening and checkup Recommended yearly dental visit for hygiene and checkup  Vaccinations: Influenza vaccine: Due-May obtain vaccine at our office or your local pharmacy. Pneumococcal vaccine: Due-May obtain vaccine at our office or your local pharmacy. Tdap vaccine: Discuss with pharmacy Shingles vaccine: Discuss with pharmacy   Covid-19: Declined  Advanced directives: Declined information today.  Conditions/risks identified: See problem list  Next appointment: Follow up in one year for your annual wellness visit.   Preventive Care 65 Years and Older, Male Preventive care refers to lifestyle choices and visits with your health care provider that can promote health and wellness. What does preventive care include? A yearly physical exam. This is also called an annual well check. Dental exams once or twice a year. Routine eye exams. Ask your health care provider how often you should have your eyes checked. Personal lifestyle choices, including: Daily care of your teeth and gums. Regular physical activity. Eating a healthy diet. Avoiding tobacco and drug use. Limiting alcohol use. Practicing safe sex. Taking low doses of aspirin every day. Taking vitamin and mineral supplements as recommended by your health care provider. What happens during an annual well check? The services and screenings done by your health care provider during your annual well check will depend on your age, overall health, lifestyle risk factors, and family history of disease. Counseling  Your health care provider  may ask you questions about your: Alcohol use. Tobacco use. Drug use. Emotional well-being. Home and relationship well-being. Sexual activity. Eating habits. History of falls. Memory and ability to understand (cognition). Work and work Astronomer. Screening  You may have the following tests or measurements: Height, weight, and BMI. Blood pressure. Lipid and cholesterol levels. These may be checked every 5 years, or more frequently if you are over 48 years old. Skin check. Lung cancer screening. You may have this screening every year starting at age 65 if you have a 30-pack-year history of smoking and currently smoke or have quit within the past 15 years. Fecal occult blood test (FOBT) of the stool. You may have this test every year starting at age 65. Flexible sigmoidoscopy or colonoscopy. You may have a sigmoidoscopy every 5 years or a colonoscopy every 10 years starting at age 65. Prostate cancer screening. Recommendations will vary depending on your family history and other risks. Hepatitis C blood test. Hepatitis B blood test. Sexually transmitted disease (STD) testing. Diabetes screening. This is done by checking your blood sugar (glucose) after you have not eaten for a while (fasting). You may have this done every 1-3 years. Abdominal aortic aneurysm (AAA) screening. You may need this if you are a current or former smoker. Osteoporosis. You may be screened starting at age 65 if you are at high risk. Talk with your health care provider about your test results, treatment options, and if necessary, the need for more tests. Vaccines  Your health care provider may recommend certain vaccines, such as: Influenza vaccine. This is recommended every year. Tetanus, diphtheria, and acellular pertussis (Tdap, Td) vaccine. You may need a Td booster every 10 years. Zoster vaccine. You may need this after age 65. Pneumococcal  13-valent conjugate (PCV13) vaccine. One dose is recommended after  age 41. Pneumococcal polysaccharide (PPSV23) vaccine. One dose is recommended after age 65. Talk to your health care provider about which screenings and vaccines you need and how often you need them. This information is not intended to replace advice given to you by your health care provider. Make sure you discuss any questions you have with your health care provider. Document Released: 01/04/2016 Document Revised: 08/27/2016 Document Reviewed: 10/09/2015 Elsevier Interactive Patient Education  2017 [place] Prevention in the Home Falls can cause injuries. They can happen to people of all ages. There are many things you can do to make your home safe and to help prevent falls. What can I do on the outside of my home? Regularly fix the edges of walkways and driveways and fix any cracks. Remove anything that might make you trip as you walk through a door, such as a raised step or threshold. Trim any bushes or trees on the path to your home. Use bright outdoor lighting. Clear any walking paths of anything that might make someone trip, such as rocks or tools. Regularly check to see if handrails are loose or broken. Make sure that both sides of any steps have handrails. Any raised decks and porches should have guardrails on the edges. Have any leaves, snow, or ice cleared regularly. Use sand or salt on walking paths during winter. Clean up any spills in your garage right away. This includes oil or grease spills. What can I do in the bathroom? Use night lights. Install grab bars by the toilet and in the tub and shower. Do not use towel bars as grab bars. Use non-skid mats or decals in the tub or shower. If you need to sit down in the shower, use a plastic, non-slip stool. Keep the floor dry. Clean up any water that spills on the floor as soon as it happens. Remove soap buildup in the tub or shower regularly. Attach bath mats securely with double-sided non-slip rug tape. Do not have  throw rugs and other things on the floor that can make you trip. What can I do in the bedroom? Use night lights. Make sure that you have a light by your bed that is easy to reach. Do not use any sheets or blankets that are too big for your bed. They should not hang down onto the floor. Have a firm chair that has side arms. You can use this for support while you get dressed. Do not have throw rugs and other things on the floor that can make you trip. What can I do in the kitchen? Clean up any spills right away. Avoid walking on wet floors. Keep items that you use a lot in easy-to-reach places. If you need to reach something above you, use a strong step stool that has a grab bar. Keep electrical cords out of the way. Do not use floor polish or wax that makes floors slippery. If you must use wax, use non-skid floor wax. Do not have throw rugs and other things on the floor that can make you trip. What can I do with my stairs? Do not leave any items on the stairs. Make sure that there are handrails on both sides of the stairs and use them. Fix handrails that are broken or loose. Make sure that handrails are as long as the stairways. Check any carpeting to make sure that it is firmly attached to the stairs. Fix any carpet  that is loose or worn. Avoid having throw rugs at the top or bottom of the stairs. If you do have throw rugs, attach them to the floor with carpet tape. Make sure that you have a light switch at the top of the stairs and the bottom of the stairs. If you do not have them, ask someone to add them for you. What else can I do to help prevent falls? Wear shoes that: Do not have high heels. Have rubber bottoms. Are comfortable and fit you well. Are closed at the toe. Do not wear sandals. If you use a stepladder: Make sure that it is fully opened. Do not climb a closed stepladder. Make sure that both sides of the stepladder are locked into place. Ask someone to hold it for you, if  possible. Clearly mark and make sure that you can see: Any grab bars or handrails. First and last steps. Where the edge of each step is. Use tools that help you move around (mobility aids) if they are needed. These include: Canes. Walkers. Scooters. Crutches. Turn on the lights when you go into a dark area. Replace any light bulbs as soon as they burn out. Set up your furniture so you have a clear path. Avoid moving your furniture around. If any of your floors are uneven, fix them. If there are any pets around you, be aware of where they are. Review your medicines with your doctor. Some medicines can make you feel dizzy. This can increase your chance of falling. Ask your doctor what other things that you can do to help prevent falls. This information is not intended to replace advice given to you by your health care provider. Make sure you discuss any questions you have with your health care provider. Document Released: 10/04/2009 Document Revised: 05/15/2016 Document Reviewed: 01/12/2015 Elsevier Interactive Patient Education  2017 [employer].

## 2021-09-24 ENCOUNTER — Telehealth: Payer: Self-pay | Admitting: *Deleted

## 2021-09-24 NOTE — Telephone Encounter (Signed)
   Telephone encounter was:  Successful.  09/24/2021 Name: Indigo Chaddock MRN: [id number] DOB: 1956-03-07  Jusiah Aguayo is a 65 y.o. year old male who is a primary care patient of Olive Bass, FNP . The community resource team was consulted for assistance with patient has been to DSS and [employer] has no funds , gave patient ht e number of the utility Commission to see if there was any assitance available from them , patient declined other community resources   Care guide performed the following interventions: Patient provided with information about care guide support team and interviewed to confirm resource needs Follow up call placed to community resources to determine status of patients referral.  Follow Up Plan:  No further follow up planned at this time. The patient has been provided with needed resources. Alois Cliche -[facility] Guide , Embedded Care Coordination [facility], Care Management  [phone] [address] [place] , [place] [address] Email : [email or web address]

## 2021-09-30 DIAGNOSIS — Z20822 Contact with and (suspected) exposure to covid-19: Secondary | ICD-10-CM | POA: Diagnosis not present

## 2021-10-02 ENCOUNTER — Ambulatory Visit (INDEPENDENT_AMBULATORY_CARE_PROVIDER_SITE_OTHER): Payer: Medicare Other | Admitting: Pulmonary Disease

## 2021-10-02 ENCOUNTER — Other Ambulatory Visit: Payer: Self-pay

## 2021-10-02 ENCOUNTER — Encounter: Payer: Self-pay | Admitting: Pulmonary Disease

## 2021-10-02 VITALS — BP 124/86 | HR 87 | Temp 97.1°F | Ht 74.0 in | Wt 233.0 lb

## 2021-10-02 DIAGNOSIS — J4550 Severe persistent asthma, uncomplicated: Secondary | ICD-10-CM

## 2021-10-02 DIAGNOSIS — R0789 Other chest pain: Secondary | ICD-10-CM | POA: Diagnosis not present

## 2021-10-02 DIAGNOSIS — J4540 Moderate persistent asthma, uncomplicated: Secondary | ICD-10-CM | POA: Diagnosis not present

## 2021-10-02 DIAGNOSIS — R0609 Other forms of dyspnea: Secondary | ICD-10-CM

## 2021-10-02 DIAGNOSIS — R0602 Shortness of breath: Secondary | ICD-10-CM

## 2021-10-02 DIAGNOSIS — J449 Chronic obstructive pulmonary disease, unspecified: Secondary | ICD-10-CM

## 2021-10-02 LAB — PULMONARY FUNCTION TEST
DL/VA % pred: 97 %
DL/VA: 3.97 ml/min/mmHg/L
DLCO cor % pred: 59 %
DLCO cor: 17.43 ml/min/mmHg
DLCO unc % pred: 59 %
DLCO unc: 17.43 ml/min/mmHg
FEF 25-75 Post: 0.85 L/sec
FEF 25-75 Pre: 0.62 L/sec
FEF2575-%Change-Post: 37 %
FEF2575-%Pred-Post: 28 %
FEF2575-%Pred-Pre: 20 %
FEV1-%Change-Post: 12 %
FEV1-%Pred-Post: 36 %
FEV1-%Pred-Pre: 31 %
FEV1-Post: 1.38 L
FEV1-Pre: 1.22 L
FEV1FVC-%Change-Post: 3 %
FEV1FVC-%Pred-Pre: 78 %
FEV6-%Change-Post: 7 %
FEV6-%Pred-Post: 46 %
FEV6-%Pred-Pre: 42 %
FEV6-Post: 2.25 L
FEV6-Pre: 2.1 L
FEV6FVC-%Change-Post: -1 %
FEV6FVC-%Pred-Post: 103 %
FEV6FVC-%Pred-Pre: 105 %
FVC-%Change-Post: 8 %
FVC-%Pred-Post: 44 %
FVC-%Pred-Pre: 40 %
FVC-Post: 2.28 L
FVC-Pre: 2.1 L
Post FEV1/FVC ratio: 60 %
Post FEV6/FVC ratio: 99 %
Pre FEV1/FVC ratio: 58 %
Pre FEV6/FVC Ratio: 100 %
RV % pred: 132 %
RV: 3.34 L
TLC % pred: 76 %
TLC: 5.84 L

## 2021-10-02 MED ORDER — BREZTRI AEROSPHERE 160-9-4.8 MCG/ACT IN AERO: 2.0000 | INHALATION_SPRAY | Freq: Two times a day (BID) | RESPIRATORY_TRACT | 5 refills | Status: DC

## 2021-10-02 MED ORDER — PREDNISONE 10 MG PO TABS: ORAL_TABLET | ORAL | 0 refills | Status: DC

## 2021-10-02 NOTE — Progress Notes (Signed)
Full PFT performed today. °

## 2021-10-02 NOTE — Progress Notes (Signed)
Synopsis: Referred in June 2022 for self referral, asthma PCP:  by Olive Bass,*  Subjective:   PATIENT ID: Mark Doyle GENDER: male DOB: 07-Oct-1956, MRN: [id number]  Chief Complaint  Patient presents with   Follow-up    Follow up on pft    PMH asthma, seasonal allergies, HTN, never smoker. He is SOB when he walks. He uses his sister nebulizer and OTC primatime mist.  Here today to establish care with new primary pulmonologist.  He does not have a primary care doctor.  He does have ongoing symptoms of dyspnea on exertion.  Nocturnal cough.  He has had asthma for a long time.  Still using over-the-counter Primatene Mist.  And using his sister's nebulizer machine on occasion.  He does not have any maintenance inhalers or no albuterol to use at home.  He does have seasonal allergies.  No other significant past medical history.  Please see below.  Patient had a coughing episode a few weeks ago developed right-sided abdominal and flank pain.  He feels like he pulled a muscle its been sore.  He has been using a heating pad regularly.  OV 10/02/2021: Here today for follow-up after pulmonary function tests.  He has a new FEV1 of only 36% predicted with significant bronchodilator response, significant air trapping.  The non-smoker with a longstanding history of asthma.  Is like he has had significant airway remodeling.  He has not been using a maintenance inhaler.  He did not like using Advair or Breo.  It caused him to have significant sore throat.  So he stopped using it.  Only using albuterol at this time.  Presents today to the office with ongoing cough chest pain shortness of breath.  He feels very raspy at times and still having ongoing wheezing which is worse at nighttime.    Past Medical History:  Diagnosis Date   Asthma    Environmental allergies      Family History  Problem Relation Age of Onset   Asthma Mother    Heart disease Father    Stroke Father      Past  Surgical History:  Procedure Laterality Date   CHOLECYSTECTOMY     SPINE SURGERY     TONSILLECTOMY      Social History   Socioeconomic History   Marital status: Married    Spouse name: Not on file   Number of children: Not on file   Years of education: Not on file   Highest education level: Not on file  Occupational History   Not on file  Tobacco Use   Smoking status: Never   Smokeless tobacco: Current    Types: Chew  Vaping Use   Vaping Use: Never used  Substance and Sexual Activity   Alcohol use: Yes    Alcohol/week: 12.0 standard drinks    Types: 12 Cans of beer per week    Comment: occasionally   Drug use: Not on file   Sexual activity: Not Currently  Other Topics Concern   Not on file  Social History Narrative   Not on file   Social Determinants of Health   Financial Resource Strain: Medium Risk   Difficulty of Paying Living Expenses: Somewhat hard  Food Insecurity: No Food Insecurity   Worried About Running Out of Food in the Last Year: Never true   Ran Out of Food in the Last Year: Never true  Transportation Needs: No Transportation Needs   Lack of Transportation (Medical): No  Lack of Transportation (Non-Medical): No  Physical Activity: Inactive   Days of Exercise per Week: 0 days   Minutes of Exercise per Session: 0 min  Stress: Stress Concern Present   Feeling of Stress : To some extent  Social Connections: Socially Isolated   Frequency of Communication with Friends and Family: Once a week   Frequency of Social Gatherings with Friends and Family: Once a week   Attends Religious Services: Never   Database administrator or Organizations: No   Attends Engineer, structural: Never   Marital Status: Married  Catering manager Violence: Not At Risk   Fear of Current or Ex-Partner: No   Emotionally Abused: No   Physically Abused: No   Sexually Abused: No     No Known Allergies   Outpatient Medications Prior to Visit  Medication Sig  Dispense Refill   albuterol (PROVENTIL) (2.5 MG/3ML) 0.083% nebulizer solution Take 3 mLs (2.5 mg total) by nebulization every 6 (six) hours as needed for wheezing or shortness of breath. 120 mL 5   albuterol (VENTOLIN HFA) 108 (90 Base) MCG/ACT inhaler Inhale 2 puffs into the lungs every 6 (six) hours as needed for wheezing or shortness of breath. 8 g 6   aspirin 81 MG chewable tablet Chew 81 mg by mouth daily.     atorvastatin (LIPITOR) 20 MG tablet Take 1 tablet (20 mg total) by mouth daily. 90 tablet 0   Multiple Vitamins-Minerals (ZINC PO) Take 1 tablet by mouth daily. Unknown strenght     Omega-3 Fatty Acids (FISH OIL PO) Take 1 tablet by mouth daily.     omeprazole (PRILOSEC) 20 MG capsule Take 20 mg by mouth daily.     VITAMIN D PO Take 1 tablet by mouth daily. Unknown strength     ADVAIR HFA 115-21 MCG/ACT inhaler Inhale 2 puffs into the lungs as needed for shortness of breath or wheezing. (Patient not taking: No sig reported)     fluticasone furoate-vilanterol (BREO ELLIPTA) 100-25 MCG/INH AEPB Inhale 1 puff into the lungs daily. (Patient not taking: No sig reported) 60 each 4   No facility-administered medications prior to visit.    Review of Systems  Constitutional:  Negative for chills, fever, malaise/fatigue and weight loss.  HENT:  Negative for hearing loss, sore throat and tinnitus.   Eyes:  Negative for blurred vision and double vision.  Respiratory:  Positive for cough, shortness of breath and wheezing. Negative for hemoptysis, sputum production and stridor.   Cardiovascular:  Negative for chest pain, palpitations, orthopnea, leg swelling and PND.  Gastrointestinal:  Negative for abdominal pain, constipation, diarrhea, heartburn, nausea and vomiting.  Genitourinary:  Negative for dysuria, hematuria and urgency.  Musculoskeletal:  Negative for joint pain and myalgias.  Skin:  Negative for itching and rash.  Neurological:  Negative for dizziness, tingling, weakness and  headaches.  Endo/Heme/Allergies:  Negative for environmental allergies. Does not bruise/bleed easily.  Psychiatric/Behavioral:  Negative for depression. The patient is not nervous/anxious and does not have insomnia.   All other systems reviewed and are negative.   Objective:  Physical Exam Vitals reviewed.  Constitutional:      General: He is not in acute distress.    Appearance: He is well-developed.  HENT:     Head: Normocephalic and atraumatic.  Eyes:     General: No scleral icterus.    Conjunctiva/sclera: Conjunctivae normal.     Pupils: Pupils are equal, round, and reactive to light.  Neck:  Vascular: No JVD.     Trachea: No tracheal deviation.  Cardiovascular:     Rate and Rhythm: Normal rate and regular rhythm.     Heart sounds: Normal heart sounds. No murmur heard. Pulmonary:     Effort: Pulmonary effort is normal. No tachypnea, accessory muscle usage or respiratory distress.     Breath sounds: No stridor. Wheezing present. No rhonchi or rales.     Comments: Inspiratory squat bilaterally Abdominal:     General: Bowel sounds are normal. There is no distension.     Palpations: Abdomen is soft.     Tenderness: There is no abdominal tenderness.  Musculoskeletal:        General: No tenderness.     Cervical back: Neck supple.  Lymphadenopathy:     Cervical: No cervical adenopathy.  Skin:    General: Skin is warm and dry.     Capillary Refill: Capillary refill takes less than 2 seconds.     Findings: No rash.  Neurological:     Mental Status: He is alert and oriented to person, place, and time.  Psychiatric:        Behavior: Behavior normal.     Vitals:   10/02/21 1018  BP: 124/86  Pulse: 87  Temp: (!) 97.1 F (36.2 C)  TempSrc: Oral  SpO2: 96%  Weight: 233 lb (105.7 kg)  Height: 6\' 2"  (1.88 m)   96% on RA BMI Readings from Last 3 Encounters:  10/02/21 29.92 kg/m  09/23/21 30.42 kg/m  08/16/21 29.66 kg/m   Wt Readings from Last 3 Encounters:   10/02/21 233 lb (105.7 kg)  09/23/21 230 lb 9.6 oz (104.6 kg)  08/16/21 231 lb (104.8 kg)     CBC    Component Value Date/Time   WBC 6.4 07/09/2021 1121   RBC 4.65 07/09/2021 1121   HGB 15.4 07/09/2021 1121   HCT 45.0 07/09/2021 1121   PLT 254.0 07/09/2021 1121   MCV 96.6 07/09/2021 1121   MCHC 34.3 07/09/2021 1121   RDW 13.4 07/09/2021 1121   LYMPHSABS 0.9 07/09/2021 1121   MONOABS 0.8 07/09/2021 1121   EOSABS 0.5 07/09/2021 1121   BASOSABS 0.3 (H) 07/09/2021 1121    Chest Imaging:   Pulmonary Functions Testing Results: PFT Results Latest Ref Rng & Units 10/02/2021  FVC-Pre L 2.10  FVC-Predicted Pre % 40  FVC-Post L 2.28  FVC-Predicted Post % 44  Pre FEV1/FVC % % 58  Post FEV1/FCV % % 60  FEV1-Pre L 1.22  FEV1-Predicted Pre % 31  FEV1-Post L 1.38  DLCO uncorrected ml/min/mmHg 17.43  DLCO UNC% % 59  DLCO corrected ml/min/mmHg 17.43  DLCO COR %Predicted % 59  DLVA Predicted % 97  TLC L 5.84  TLC % Predicted % 76  RV % Predicted % 132    FeNO:   Pathology:   Echocardiogram:   Heart Catheterization:     Assessment & Plan:     ICD-10-CM   1. Severe persistent asthma without complication  J45.50     2. SOB (shortness of breath)  R06.02     3. DOE (dyspnea on exertion)  R06.09     4. Chest tightness  R07.89     5. Obstructive lung disease (generalized) (HCC)  J44.9       Discussion:  This is a 65 year old gentleman ongoing shortness of breath.  I think he has severe persistent asthma.  He has airway remodeling with obstructive lung disease now.  His FEV1 is 36% predicted  with significant bronchodilator response.  He has not had blood work to look for any markers.  He is not super interested in that.  We did talk about briefly today in the office.  He has not been taking any counter maintenance inhalers.  He seems to be very reluctant to that prefers just uses albuterol.  I explained that this is not an appropriate regimen and he needs to be on  maintenance inhalers of some kind.  We reviewed and interpreted his PFTs today in the office.  Plan: Start him on Breztri with spacer. Albuterol with spacer Albuterol nebs as needed. Follow-up in 4 to 6 weeks. Started on prednisone taper today because he is having ongoing symptoms.    Current Outpatient Medications:    albuterol (PROVENTIL) (2.5 MG/3ML) 0.083% nebulizer solution, Take 3 mLs (2.5 mg total) by nebulization every 6 (six) hours as needed for wheezing or shortness of breath., Disp: 120 mL, Rfl: 5   albuterol (VENTOLIN HFA) 108 (90 Base) MCG/ACT inhaler, Inhale 2 puffs into the lungs every 6 (six) hours as needed for wheezing or shortness of breath., Disp: 8 g, Rfl: 6   aspirin 81 MG chewable tablet, Chew 81 mg by mouth daily., Disp: , Rfl:    atorvastatin (LIPITOR) 20 MG tablet, Take 1 tablet (20 mg total) by mouth daily., Disp: 90 tablet, Rfl: 0   Budeson-Glycopyrrol-Formoterol (BREZTRI AEROSPHERE) 160-9-4.8 MCG/ACT AERO, Inhale 2 puffs into the lungs in the morning and at bedtime., Disp: 10.7 g, Rfl: 5   Multiple Vitamins-Minerals (ZINC PO), Take 1 tablet by mouth daily. Unknown strenght, Disp: , Rfl:    Omega-3 Fatty Acids (FISH OIL PO), Take 1 tablet by mouth daily., Disp: , Rfl:    omeprazole (PRILOSEC) 20 MG capsule, Take 20 mg by mouth daily., Disp: , Rfl:    predniSONE (DELTASONE) 10 MG tablet, Take 4 tabs by mouth once daily x4 days, then 3 tabs x4 days, 2 tabs x4 days, 1 tab x4 days and stop., Disp: 40 tablet, Rfl: 0   VITAMIN D PO, Take 1 tablet by mouth daily. Unknown strength, Disp: , Rfl:    ADVAIR HFA 115-21 MCG/ACT inhaler, Inhale 2 puffs into the lungs as needed for shortness of breath or wheezing. (Patient not taking: No sig reported), Disp: , Rfl:    [name], DO [facility] 10/02/2021 10:48 AM

## 2021-10-02 NOTE — Patient Instructions (Signed)
Full PFT performed today. °

## 2021-10-02 NOTE — Patient Instructions (Signed)
Thank you for visiting Dr. Tonia Brooms at [facility]. Today we recommend the following:   Meds ordered this encounter  Medications   Budeson-Glycopyrrol-Formoterol (BREZTRI AEROSPHERE) 160-9-4.8 MCG/ACT AERO    Sig: Inhale 2 puffs into the lungs in the morning and at bedtime.    Dispense:  10.7 g    Refill:  5   predniSONE (DELTASONE) 10 MG tablet    Sig: Take 4 tabs by mouth once daily x4 days, then 3 tabs x4 days, 2 tabs x4 days, 1 tab x4 days and stop.    Dispense:  40 tablet    Refill:  0   Needs a spacer and spacer education.   Return in about 6 weeks (around 11/13/2021) for with APP or Dr. Tonia Brooms.    Please do your part to reduce the spread of COVID-19.

## 2021-10-03 ENCOUNTER — Telehealth: Payer: Self-pay | Admitting: Pulmonary Disease

## 2021-10-03 MED ORDER — AEROCHAMBER MV MISC: 0 refills | Status: AC

## 2021-10-03 NOTE — Telephone Encounter (Signed)
We currently do not have any spacer samples at the office. Called and spoke with pt letting him know this had been done and that I was going to send Rx to pharmacy and he verbalized understanding. Verified preferred pharmacy and sent Rx in. Nothing further needed.

## 2021-10-03 NOTE — Telephone Encounter (Signed)
disregard

## 2021-10-03 NOTE — Telephone Encounter (Signed)
Spoke with the pt and notified of response per Dr Tonia Brooms  I placed the signed placard in the mail for him after verifying his address

## 2021-10-03 NOTE — Telephone Encounter (Signed)
Called and spoke with patient, advised patient regarding recent diagnosis of asthma (obstructive lung disease).  Advised him regarding use of his inhalers.  He is requesting a handicapped placard.  Advised I would send a message to Dr. Tonia Brooms and once we hear back from him we will call him back and let him know.  He verbalized understanding.    Dr. Tonia Brooms, please advise regarding handicapped placard.  Thank you.

## 2021-10-03 NOTE — Telephone Encounter (Signed)
Pt states he was "thrown for a loop yesterday" when he was told he had a "lung disease" Patient wanting to know what it is, per notes it says ""Obstructive lung disease". Pt also asking since lungs are 36%, does that qualify him for a handicap placard. Please advise [phone]

## 2021-10-03 NOTE — Telephone Encounter (Signed)
Icard, Rachel Bo, DO  Ellin Saba, Ledell Peoples, RN; P [facility] Triage Pool Caller: Unspecified (Today, 10:27 AM)  Yes, I can sign form for handicap placard.   Yes, he has obstructive lung disease related to his history of asthma for many years.   [name], DO  [facility]  10/03/2021 12:31 PM   Form filled out and given to BI to sign Will hold and call pt when recieved

## 2021-10-04 ENCOUNTER — Telehealth: Payer: Self-pay | Admitting: Pulmonary Disease

## 2021-10-04 NOTE — Telephone Encounter (Signed)
Call made to patient, confirmed DOB. Patient states he attempted to get a spacer from the pharmacy and was told it was $80. He wanted to know if we had samples. Made aware it has been placed upfront for pick up. Voiced understanding.   Nothing further needed at this time.

## 2021-10-08 ENCOUNTER — Ambulatory Visit: Payer: Medicare HMO | Admitting: Cardiology

## 2021-11-11 ENCOUNTER — Ambulatory Visit: Payer: Medicare Other | Admitting: Pulmonary Disease

## 2021-12-03 ENCOUNTER — Telehealth: Payer: Self-pay | Admitting: Family

## 2021-12-03 MED ORDER — ATORVASTATIN CALCIUM 20 MG PO TABS: 20.0000 mg | ORAL_TABLET | Freq: Every day | ORAL | 1 refills | Status: DC

## 2021-12-03 NOTE — Telephone Encounter (Signed)
Didn't know if he need to refill this medication  Medication: atorvastatin (LIPITOR) 20 MG tablet   Has the patient contacted their pharmacy? No. (If no, request that the patient contact the pharmacy for the refill.) (If yes, when and what did the pharmacy advise?)  Preferred Pharmacy (with phone number or street name): [id number] - [place], [place] - [address] AT [facility] OF [address]  [address], [place] [address]  Phone:  [phone]  Fax:  [phone]   Agent: Please be advised that RX refills may take up to 3 business days. We ask that you follow-up with your pharmacy.

## 2021-12-03 NOTE — Telephone Encounter (Signed)
I have called the pt and informed him that I refilled his medication. He should call the heart doctor to ensure that he doesn't need a follow up with them and he stated he will.   Rx has been sent to [facility] per pt request.

## 2022-02-13 DIAGNOSIS — H5213 Myopia, bilateral: Secondary | ICD-10-CM | POA: Diagnosis not present

## 2022-02-13 DIAGNOSIS — H25013 Cortical age-related cataract, bilateral: Secondary | ICD-10-CM | POA: Diagnosis not present

## 2022-02-13 DIAGNOSIS — H353131 Nonexudative age-related macular degeneration, bilateral, early dry stage: Secondary | ICD-10-CM | POA: Diagnosis not present

## 2022-02-13 DIAGNOSIS — H2513 Age-related nuclear cataract, bilateral: Secondary | ICD-10-CM | POA: Diagnosis not present

## 2022-02-13 DIAGNOSIS — H52203 Unspecified astigmatism, bilateral: Secondary | ICD-10-CM | POA: Diagnosis not present

## 2022-02-13 DIAGNOSIS — H524 Presbyopia: Secondary | ICD-10-CM | POA: Diagnosis not present

## 2022-02-13 DIAGNOSIS — H35363 Drusen (degenerative) of macula, bilateral: Secondary | ICD-10-CM | POA: Diagnosis not present

## 2022-02-18 ENCOUNTER — Telehealth: Payer: Self-pay | Admitting: Pulmonary Disease

## 2022-02-18 MED ORDER — ALBUTEROL SULFATE HFA 108 (90 BASE) MCG/ACT IN AERS: 2.0000 | INHALATION_SPRAY | Freq: Four times a day (QID) | RESPIRATORY_TRACT | 0 refills | Status: DC | PRN

## 2022-02-18 MED ORDER — BREZTRI AEROSPHERE 160-9-4.8 MCG/ACT IN AERO: 2.0000 | INHALATION_SPRAY | Freq: Two times a day (BID) | RESPIRATORY_TRACT | 0 refills | Status: DC

## 2022-02-18 NOTE — Telephone Encounter (Signed)
Once month supply of Breztri and albuterol has been sent to preferred pharmacy.  Patient is aware and voiced his understanding.  Nothing further needed.

## 2022-04-07 ENCOUNTER — Ambulatory Visit (INDEPENDENT_AMBULATORY_CARE_PROVIDER_SITE_OTHER): Payer: Medicare Other | Admitting: Pulmonary Disease

## 2022-04-07 ENCOUNTER — Encounter: Payer: Self-pay | Admitting: Pulmonary Disease

## 2022-04-07 ENCOUNTER — Telehealth: Payer: Self-pay | Admitting: Pulmonary Disease

## 2022-04-07 VITALS — BP 138/98 | HR 94 | Temp 97.9°F | Ht 74.0 in | Wt 236.4 lb

## 2022-04-07 DIAGNOSIS — J4520 Mild intermittent asthma, uncomplicated: Secondary | ICD-10-CM | POA: Diagnosis not present

## 2022-04-07 DIAGNOSIS — R0609 Other forms of dyspnea: Secondary | ICD-10-CM | POA: Diagnosis not present

## 2022-04-07 DIAGNOSIS — Z9109 Other allergy status, other than to drugs and biological substances: Secondary | ICD-10-CM | POA: Diagnosis not present

## 2022-04-07 DIAGNOSIS — R0602 Shortness of breath: Secondary | ICD-10-CM | POA: Diagnosis not present

## 2022-04-07 MED ORDER — ALBUTEROL SULFATE (2.5 MG/3ML) 0.083% IN NEBU: 2.5000 mg | INHALATION_SOLUTION | Freq: Four times a day (QID) | RESPIRATORY_TRACT | 5 refills | Status: DC | PRN

## 2022-04-07 MED ORDER — ALBUTEROL SULFATE HFA 108 (90 BASE) MCG/ACT IN AERS: 2.0000 | INHALATION_SPRAY | Freq: Four times a day (QID) | RESPIRATORY_TRACT | 0 refills | Status: DC | PRN

## 2022-04-07 MED ORDER — BREZTRI AEROSPHERE 160-9-4.8 MCG/ACT IN AERO: 2.0000 | INHALATION_SPRAY | Freq: Two times a day (BID) | RESPIRATORY_TRACT | 0 refills | Status: DC

## 2022-04-07 NOTE — Progress Notes (Signed)
? ?Synopsis: Referred in June 2022 for self referral, asthma PCP:  by Olive BassMurray, Laura Woodruff,* ? ?Subjective:  ? ?PATIENT ID: Mark Doyle GENDER: male DOB: 07/17/1956, MRN: [id number] ? ?Chief Complaint  ?Patient presents with  ? Follow-up  ?  Follow up.   ? ? ?PMH asthma, seasonal allergies, HTN, never smoker. He is SOB when he walks. He uses his sister nebulizer and OTC primatime mist.  Here today to establish care with new primary pulmonologist.  He does not have a primary care doctor.  He does have ongoing symptoms of dyspnea on exertion.  Nocturnal cough.  He has had asthma for a long time.  Still using over-the-counter Primatene Mist.  And using his sister's nebulizer machine on occasion.  He does not have any maintenance inhalers or no albuterol to use at home.  He does have seasonal allergies.  No other significant past medical history.  Please see below.  Patient had a coughing episode a few weeks ago developed right-sided abdominal and flank pain.  He feels like he pulled a muscle its been sore.  He has been using a heating pad regularly. ? ?OV 10/02/2021: Here today for follow-up after pulmonary function tests.  He has a new FEV1 of only 36% predicted with significant bronchodilator response, significant air trapping.  The non-smoker with a longstanding history of asthma.  Is like he has had significant airway remodeling.  He has not been using a maintenance inhaler.  He did not like using Advair or Breo.  It caused him to have significant sore throat.  So he stopped using it.  Only using albuterol at this time.  Presents today to the office with ongoing cough chest pain shortness of breath.  He feels very raspy at times and still having ongoing wheezing which is worse at nighttime. ? ?OV 04/07/2022: Here today for follow-up regarding severe obstructive disease.  Presumed related to longstanding history of asthma with airway remodeling.  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  However he has not had any exacerbations in the past year.  Doing well with his Breztri inhaler with spacer.  He has also gotten rid of his sore throat that he had associated with use of his previous inhalers.  He does much better on MDI than having a dry powder.  He is able to complete his activities of daily living but he does have some shortness of breath still with exertion. ? ? ? ?Past Medical History:  ?Diagnosis Date  ? Asthma   ? Environmental allergies   ?  ? ?Family History  ?Problem Relation Age of Onset  ? Asthma Mother   ? Heart disease Father   ? Stroke Father   ?  ? ?Past Surgical History:  ?Procedure Laterality Date  ? CHOLECYSTECTOMY    ? SPINE SURGERY    ? TONSILLECTOMY    ? ? ?Social History  ? ?Socioeconomic History  ? Marital status: Married  ?  Spouse name: Not on file  ? Number of children: Not on file  ? Years of education: Not on file  ? Highest education level: Not on file  ?Occupational History  ? Not on file  ?Tobacco Use  ? Smoking status: Never  ? Smokeless tobacco: Current  ?  Types: Chew  ?Vaping Use  ? Vaping Use: Never used  ?Substance and Sexual Activity  ? Alcohol use: Yes  ?  Alcohol/week: 12.0 standard drinks  ?  Types: 12 Cans of beer per week  ?  Comment: occasionally  ? Drug use: Not on file  ? Sexual activity: Not Currently  ?Other Topics Concern  ? Not on file  ?Social History Narrative  ? Not on file  ? ?Social Determinants of Health  ? ?Financial Resource Strain: Medium Risk  ? Difficulty of Paying Living Expenses: Somewhat hard  ?Food Insecurity: No Food Insecurity  ? Worried About Programme researcher, broadcasting/film/video in the Last Year: Never true  ? Ran Out of Food in the Last Year: Never true  ?Transportation Needs: No Transportation Needs  ? Lack of Transportation (Medical): No  ? Lack of Transportation (Non-Medical): No  ?Physical Activity: Inactive  ? Days of Exercise per Week: 0 days  ? Minutes of Exercise per Session: 0 min   ?Stress: Stress Concern Present  ? Feeling of Stress : To some extent  ?Social Connections: Socially Isolated  ? Frequency of Communication with Friends and Family: Once a week  ? Frequency of Social Gatherings with Friends and Family: Once a week  ? Attends Religious Services: Never  ? Active Member of Clubs or Organizations: No  ? Attends Banker Meetings: Never  ? Marital Status: Married  ?Intimate Partner Violence: Not At Risk  ? Fear of Current or Ex-Partner: No  ? Emotionally Abused: No  ? Physically Abused: No  ? Sexually Abused: No  ?  ? ?No Known Allergies  ? ?Outpatient Medications Prior to Visit  ?Medication Sig Dispense Refill  ? albuterol (PROVENTIL) (2.5 MG/3ML) 0.083% nebulizer solution Take 3 mLs (2.5 mg total) by nebulization every 6 (six) hours as needed for wheezing or shortness of breath. 120 mL 5  ? albuterol (VENTOLIN HFA) 108 (90 Base) MCG/ACT inhaler Inhale 2 puffs into the lungs every 6 (six) hours as needed for wheezing or shortness of breath. 18 g 0  ? aspirin 81 MG chewable tablet Chew 81 mg by mouth daily.    ? atorvastatin (LIPITOR) 20 MG tablet Take 1 tablet (20 mg total) by mouth daily. 90 tablet 1  ? Budeson-Glycopyrrol-Formoterol (BREZTRI AEROSPHERE) 160-9-4.8 MCG/ACT AERO Inhale 2 puffs into the lungs in the morning and at bedtime. 10.7 g 0  ? Multiple Vitamins-Minerals (ZINC PO) Take 1 tablet by mouth daily. Unknown strenght    ? Omega-3 Fatty Acids (FISH OIL PO) Take 1 tablet by mouth daily.    ? omeprazole (PRILOSEC) 20 MG capsule Take 20 mg by mouth daily.    ? predniSONE (DELTASONE) 10 MG tablet Take 4 tabs by mouth once daily x4 days, then 3 tabs x4 days, 2 tabs x4 days, 1 tab x4 days and stop. 40 tablet 0  ? Spacer/Aero-Holding Chambers (AEROCHAMBER MV) inhaler Use as instructed 1 each 0  ? VITAMIN D PO Take 1 tablet by mouth daily. Unknown strength    ? ADVAIR HFA 115-21 MCG/ACT inhaler Inhale 2 puffs into the lungs as needed for shortness of breath or  wheezing. (Patient not taking: Reported on 09/23/2021)    ? ?No facility-administered medications prior to visit.  ? ? ?Review of Systems  ?Constitutional:  Negative for chills, fever, malaise/fatigue and weight loss.  ?HENT:  Negative for hearing loss, sore throat and tinnitus.   ?Eyes:  Negative for blurred vision and double vision.  ?Respiratory:  Positive for shortness of breath. Negative for cough, hemoptysis, sputum production, wheezing and stridor.   ?Cardiovascular:  Negative for chest pain, palpitations, orthopnea, leg swelling and PND.  ?Gastrointestinal:  Negative for abdominal pain, constipation, diarrhea, heartburn, nausea and vomiting.  ?Genitourinary:  Negative for dysuria, hematuria and urgency.  ?Musculoskeletal:  Negative for joint pain and myalgias.  ?Skin:  Negative for itching and rash.  ?Neurological:  Negative for dizziness, tingling, weakness and headaches.  ?Endo/Heme/Allergies:  Negative for environmental allergies. Does not bruise/bleed easily.  ?Psychiatric/Behavioral:  Negative for depression. The patient is not nervous/anxious and does not have insomnia.   ?All other systems reviewed and are negative. ? ? ?Objective:  ?Physical Exam ?Vitals reviewed.  ?Constitutional:   ?   General: He is not in acute distress. ?   Appearance: He is well-developed.  ?HENT:  ?   Head: Normocephalic and atraumatic.  ?Eyes:  ?   General: No scleral icterus. ?   Conjunctiva/sclera: Conjunctivae normal.  ?   Pupils: Pupils are equal, round, and reactive to light.  ?Neck:  ?   Vascular: No JVD.  ?   Trachea: No tracheal deviation.  ?Cardiovascular:  ?   Rate and Rhythm: Normal rate and regular rhythm.  ?   Heart sounds: Normal heart sounds. No murmur heard. ?Pulmonary:  ?   Effort: Pulmonary effort is normal. No tachypnea, accessory muscle usage or respiratory distress.  ?   Breath sounds: No stridor. No wheezing, rhonchi or rales.  ?   Comments: Diminished breath sounds bilaterally no crackles no  wheeze ?Abdominal:  ?   General: There is no distension.  ?   Palpations: Abdomen is soft.  ?   Tenderness: There is no abdominal tenderness.  ?Musculoskeletal:     ?   General: No tenderness.  ?   Cervical back: Neck su

## 2022-04-07 NOTE — Patient Instructions (Signed)
Thank you for visiting Dr. Tonia Brooms at [facility]. ?Today we recommend the following: ? ?Continue albuterol as needed  ?Continue Breztri inhaler ? ?Return in about 1 year (around 04/08/2023) for with APP or Dr. Tonia Brooms. ? ? ? ?Please do your part to reduce the spread of COVID-19.  ? ?

## 2022-04-08 MED ORDER — ALBUTEROL SULFATE (2.5 MG/3ML) 0.083% IN NEBU: 2.5000 mg | INHALATION_SOLUTION | Freq: Four times a day (QID) | RESPIRATORY_TRACT | 5 refills | Status: DC | PRN

## 2022-04-08 MED ORDER — ALBUTEROL SULFATE HFA 108 (90 BASE) MCG/ACT IN AERS: 2.0000 | INHALATION_SPRAY | Freq: Four times a day (QID) | RESPIRATORY_TRACT | 3 refills | Status: DC | PRN

## 2022-04-08 MED ORDER — BREZTRI AEROSPHERE 160-9-4.8 MCG/ACT IN AERO: 2.0000 | INHALATION_SPRAY | Freq: Two times a day (BID) | RESPIRATORY_TRACT | 11 refills | Status: DC

## 2022-04-08 NOTE — Telephone Encounter (Signed)
Called and spoke with patient who is stating he has no refills on his prescriptions at the pharmacy. Advised him I would send new prescriptions with refills. Patient verified preferred pharmacy. Nothing further needed at this time. ?

## 2022-04-10 ENCOUNTER — Telehealth: Payer: Self-pay | Admitting: Pulmonary Disease

## 2022-04-10 NOTE — Telephone Encounter (Signed)
ATC patient. LVMTCB. 

## 2022-04-11 NOTE — Telephone Encounter (Signed)
Called and spoke with patient. He is requesting to have a letter that states he is not able to walk long distances and will need wheelchair accommodations traveling within the airport, boarding and deboarding due to his lung condition. He wishes to have the letter mailed to him. I verified the address we have on file.  ? ?Dr. Tonia Brooms, please advise if you are ok with this type of letter. Thanks!  ?

## 2022-04-22 NOTE — Telephone Encounter (Signed)
Called and spoke with patient. He is still requesting to have the letter mailed to him despite him flying out on Saturday. Letter will be typed and stamped.  ? ?Nothing further needed at time of call.  ?

## 2022-06-16 ENCOUNTER — Telehealth: Payer: Self-pay | Admitting: Pulmonary Disease

## 2022-06-16 MED ORDER — ALBUTEROL SULFATE (2.5 MG/3ML) 0.083% IN NEBU: 2.5000 mg | INHALATION_SOLUTION | Freq: Four times a day (QID) | RESPIRATORY_TRACT | 5 refills | Status: DC | PRN

## 2022-06-16 NOTE — Telephone Encounter (Signed)
Called and spoke with patient's wife Mark Doyle. She confirmed that he was in need for a refill on his albuterol solution. I advised her that I would go ahead and send in a refill for him. She verbalized understanding.   Nothing further needed at time of call.

## 2022-06-17 ENCOUNTER — Telehealth: Payer: Self-pay | Admitting: Pulmonary Disease

## 2022-08-06 ENCOUNTER — Telehealth: Payer: Self-pay | Admitting: Pulmonary Disease

## 2022-08-06 MED ORDER — ALBUTEROL SULFATE HFA 108 (90 BASE) MCG/ACT IN AERS: 2.0000 | INHALATION_SPRAY | Freq: Four times a day (QID) | RESPIRATORY_TRACT | 4 refills | Status: DC | PRN

## 2022-08-06 NOTE — Telephone Encounter (Signed)
ATC patient. LVMTCB. 

## 2022-08-06 NOTE — Telephone Encounter (Signed)
Called and spoke to patient about refills. Verified pharmacy with patient. Refills sent in. Nothing further needed

## 2022-08-19 IMAGING — DX DG CHEST 2V
2 series · 2 of 2 positions shown · non-contrast
Comparison: 06/05/2017

CLINICAL DATA: Shortness of breath.

EXAM:
CHEST - 2 VIEW

[chest pa]
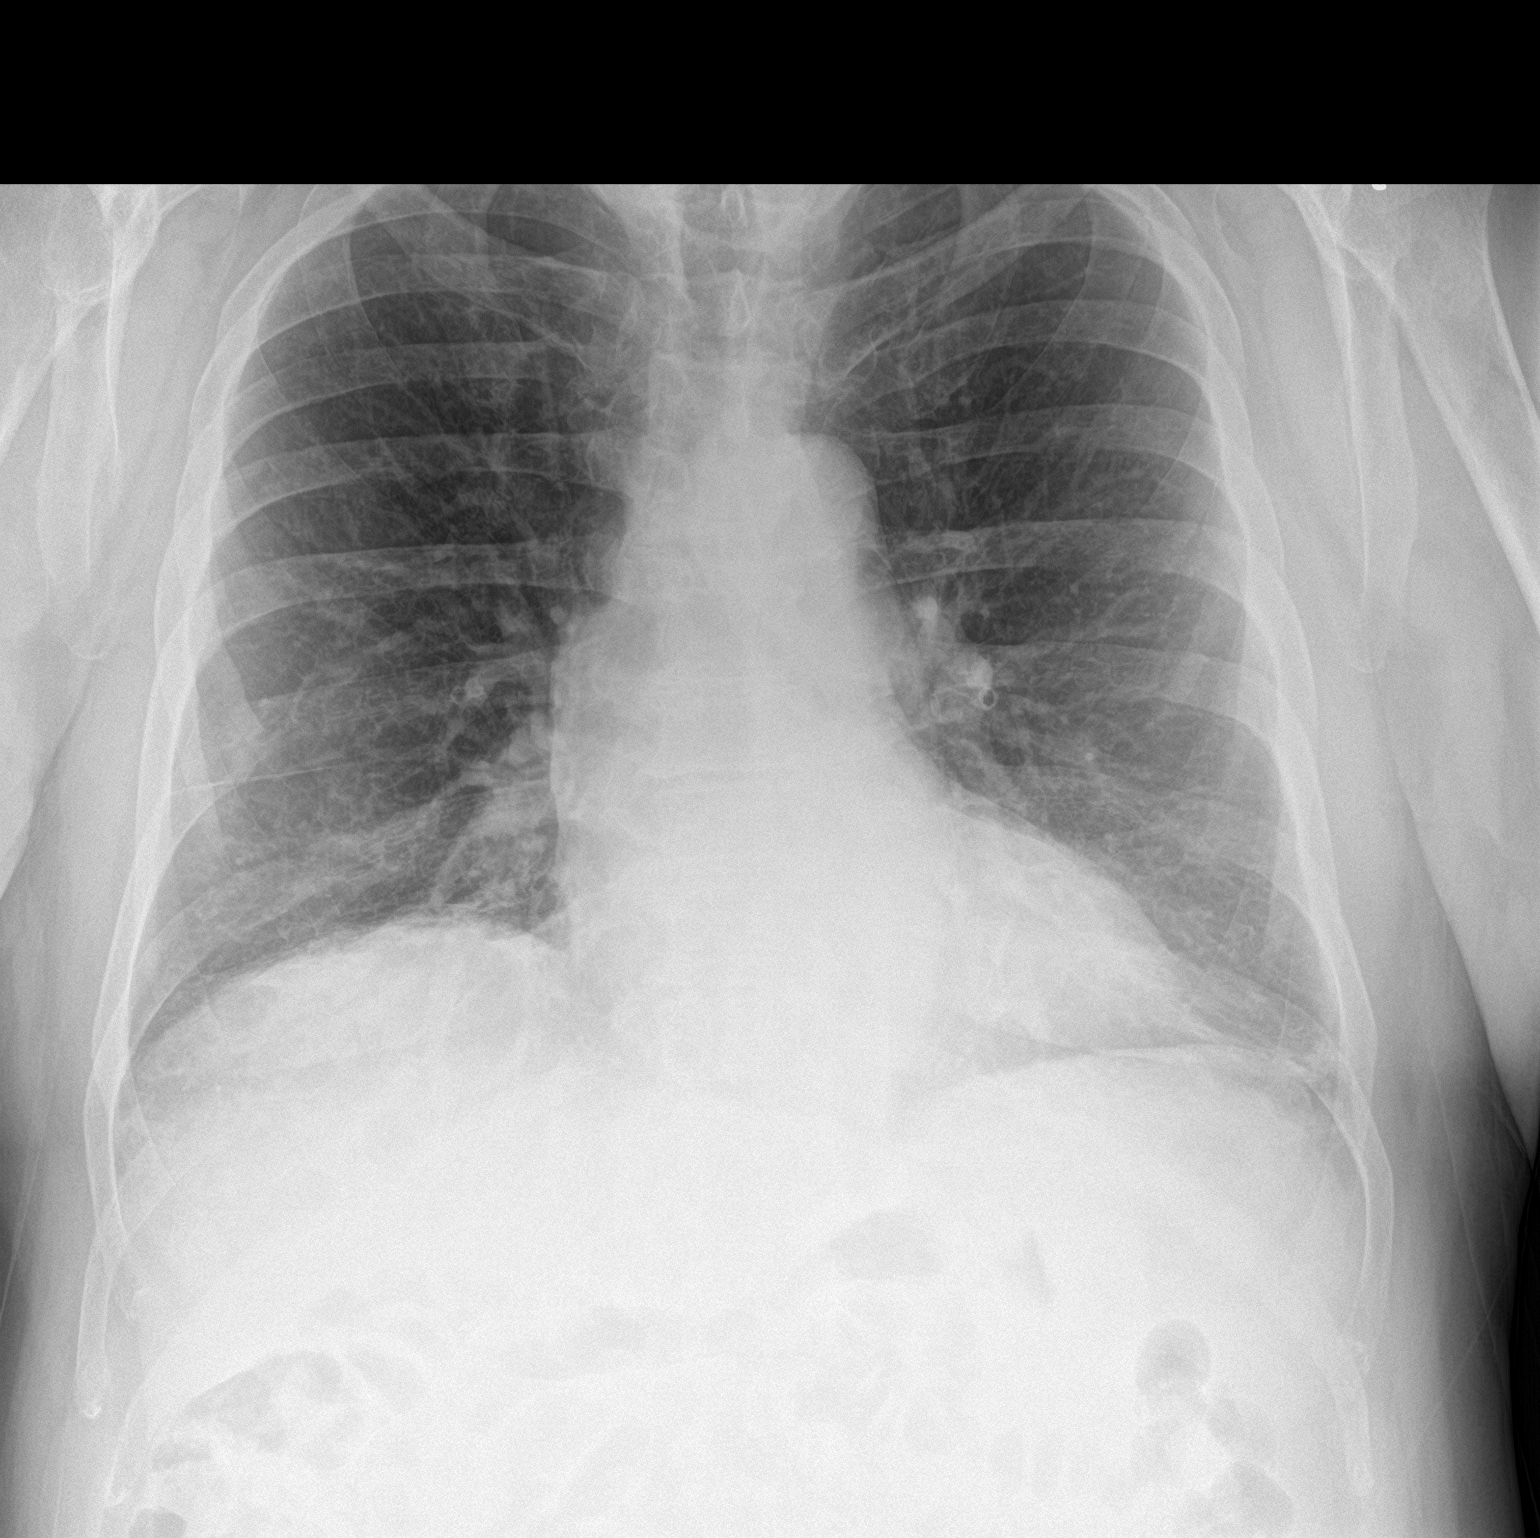

[chest lat]
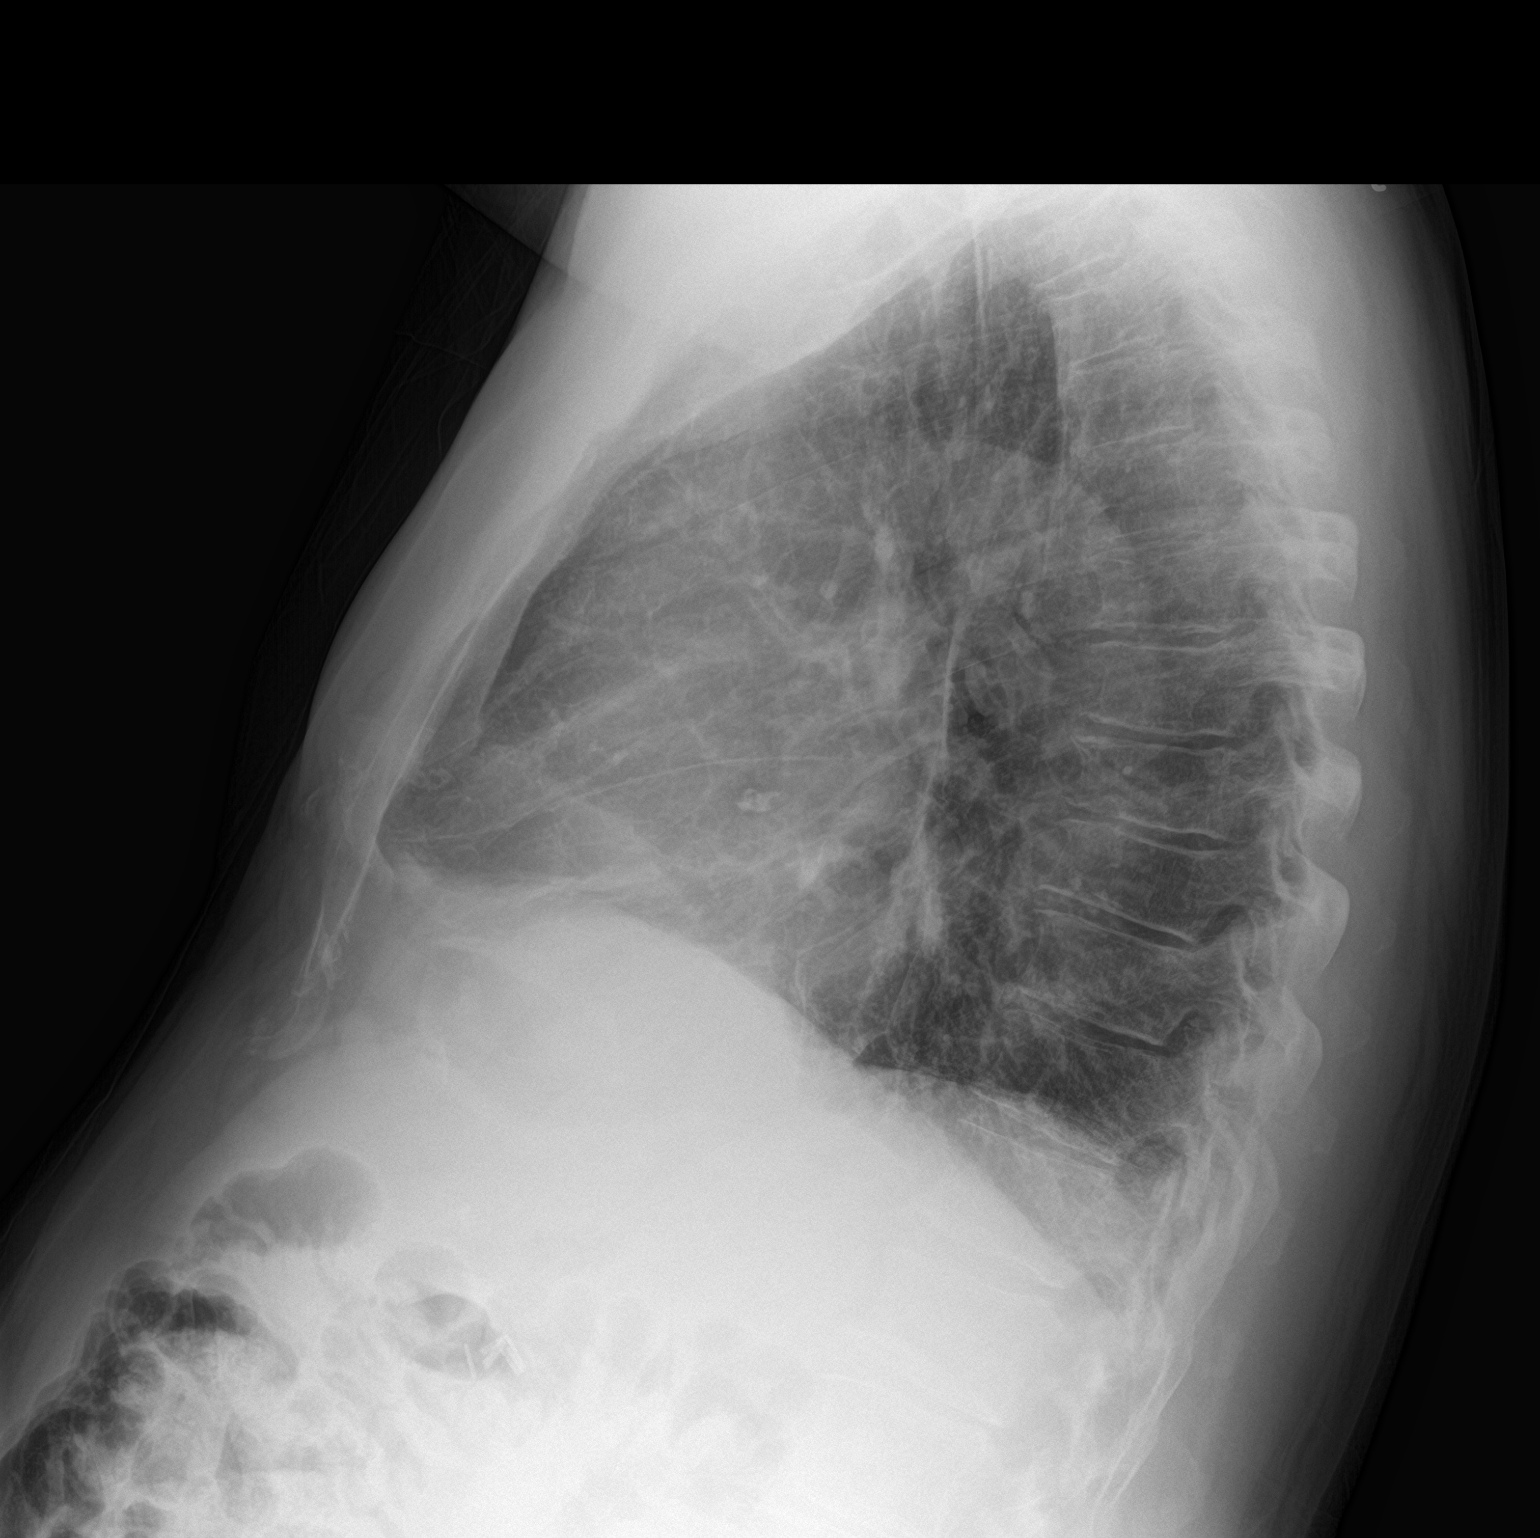

[2 of 2 positions shown; findings below may reference images not displayed]

FINDINGS: Slightly coarse lung markings are similar to the previous
examination. No focal airspace disease or lung consolidation. Heart
and mediastinum are within normal limits. No large pleural
effusions. Old right eighth rib fracture.
IMPRESSION: No acute cardiopulmonary disease.

## 2022-10-07 ENCOUNTER — Telehealth: Payer: Self-pay | Admitting: Family

## 2022-10-07 NOTE — Telephone Encounter (Signed)
Left message for patient to call back and schedule Medicare Annual Wellness Visit (AWV).   Please offer to do virtually or by telephone.  Left office number and my jabber #[phone].  Last AWV:09/23/2021  Please schedule at anytime with Nurse Health Advisor.   

## 2022-10-15 ENCOUNTER — Ambulatory Visit (INDEPENDENT_AMBULATORY_CARE_PROVIDER_SITE_OTHER): Payer: Medicare Other | Admitting: *Deleted

## 2022-10-15 DIAGNOSIS — Z1211 Encounter for screening for malignant neoplasm of colon: Secondary | ICD-10-CM | POA: Diagnosis not present

## 2022-10-15 DIAGNOSIS — Z0000 Encounter for general adult medical examination without abnormal findings: Secondary | ICD-10-CM | POA: Diagnosis not present

## 2022-10-15 NOTE — Patient Instructions (Signed)
Mark Doyle , Thank you for taking time to come for your Medicare Wellness Visit. I appreciate your ongoing commitment to your health goals. Please review the following plan we discussed and let me know if I can assist you in the future.   These are the goals we discussed:  Goals      Patient Stated     Work on breathing issues        This is a list of the screening recommended for you and due dates:  Health Maintenance  Topic Date Due   COVID-19 Vaccine (1) Never done   Hepatitis C Screening: USPSTF Recommendation to screen - Ages 11-79 yo.  Never done   Tetanus Vaccine  Never done   Colon Cancer Screening  Never done   Zoster (Shingles) Vaccine (1 of 2) Never done   Pneumonia Vaccine (1 - PCV) Never done   Flu Shot  Never done   Medicare Annual Wellness Visit  11/15/2023   HPV Vaccine  Aged Out     Next appointment: Follow up in one year for your annual wellness visit.   Preventive Care 46 Years and Older, Male Preventive care refers to lifestyle choices and visits with your health care provider that can promote health and wellness. What does preventive care include? A yearly physical exam. This is also called an annual well check. Dental exams once or twice a year. Routine eye exams. Ask your health care provider how often you should have your eyes checked. Personal lifestyle choices, including: Daily care of your teeth and gums. Regular physical activity. Eating a healthy diet. Avoiding tobacco and drug use. Limiting alcohol use. Practicing safe sex. Taking low doses of aspirin every day. Taking vitamin and mineral supplements as recommended by your health care provider. What happens during an annual well check? The services and screenings done by your health care provider during your annual well check will depend on your age, overall health, lifestyle risk factors, and family history of disease. Counseling  Your health care provider may ask you questions about  your: Alcohol use. Tobacco use. Drug use. Emotional well-being. Home and relationship well-being. Sexual activity. Eating habits. History of falls. Memory and ability to understand (cognition). Work and work Statistician. Screening  You may have the following tests or measurements: Height, weight, and BMI. Blood pressure. Lipid and cholesterol levels. These may be checked every 5 years, or more frequently if you are over 31 years old. Skin check. Lung cancer screening. You may have this screening every year starting at age 62 if you have a 30-pack-year history of smoking and currently smoke or have quit within the past 15 years. Fecal occult blood test (FOBT) of the stool. You may have this test every year starting at age 8. Flexible sigmoidoscopy or colonoscopy. You may have a sigmoidoscopy every 5 years or a colonoscopy every 10 years starting at age 38. Prostate cancer screening. Recommendations will vary depending on your family history and other risks. Hepatitis C blood test. Hepatitis B blood test. Sexually transmitted disease (STD) testing. Diabetes screening. This is done by checking your blood sugar (glucose) after you have not eaten for a while (fasting). You may have this done every 1-3 years. Abdominal aortic aneurysm (AAA) screening. You may need this if you are a current or former smoker. Osteoporosis. You may be screened starting at age 59 if you are at high risk. Talk with your health care provider about your test results, treatment options, and if necessary,  the need for more tests. Vaccines  Your health care provider may recommend certain vaccines, such as: Influenza vaccine. This is recommended every year. Tetanus, diphtheria, and acellular pertussis (Tdap, Td) vaccine. You may need a Td booster every 10 years. Zoster vaccine. You may need this after age 67. Pneumococcal 13-valent conjugate (PCV13) vaccine. One dose is recommended after age 45. Pneumococcal  polysaccharide (PPSV23) vaccine. One dose is recommended after age 42. Talk to your health care provider about which screenings and vaccines you need and how often you need them. This information is not intended to replace advice given to you by your health care provider. Make sure you discuss any questions you have with your health care provider. Document Released: 01/04/2016 Document Revised: 08/27/2016 Document Reviewed: 10/09/2015 Elsevier Interactive Patient Education  2017 [place] Prevention in the Home Falls can cause injuries. They can happen to people of all ages. There are many things you can do to make your home safe and to help prevent falls. What can I do on the outside of my home? Regularly fix the edges of walkways and driveways and fix any cracks. Remove anything that might make you trip as you walk through a door, such as a raised step or threshold. Trim any bushes or trees on the path to your home. Use bright outdoor lighting. Clear any walking paths of anything that might make someone trip, such as rocks or tools. Regularly check to see if handrails are loose or broken. Make sure that both sides of any steps have handrails. Any raised decks and porches should have guardrails on the edges. Have any leaves, snow, or ice cleared regularly. Use sand or salt on walking paths during winter. Clean up any spills in your garage right away. This includes oil or grease spills. What can I do in the bathroom? Use night lights. Install grab bars by the toilet and in the tub and shower. Do not use towel bars as grab bars. Use non-skid mats or decals in the tub or shower. If you need to sit down in the shower, use a plastic, non-slip stool. Keep the floor dry. Clean up any water that spills on the floor as soon as it happens. Remove soap buildup in the tub or shower regularly. Attach bath mats securely with double-sided non-slip rug tape. Do not have throw rugs and other  things on the floor that can make you trip. What can I do in the bedroom? Use night lights. Make sure that you have a light by your bed that is easy to reach. Do not use any sheets or blankets that are too big for your bed. They should not hang down onto the floor. Have a firm chair that has side arms. You can use this for support while you get dressed. Do not have throw rugs and other things on the floor that can make you trip. What can I do in the kitchen? Clean up any spills right away. Avoid walking on wet floors. Keep items that you use a lot in easy-to-reach places. If you need to reach something above you, use a strong step stool that has a grab bar. Keep electrical cords out of the way. Do not use floor polish or wax that makes floors slippery. If you must use wax, use non-skid floor wax. Do not have throw rugs and other things on the floor that can make you trip. What can I do with my stairs? Do not leave any items on  the stairs. Make sure that there are handrails on both sides of the stairs and use them. Fix handrails that are broken or loose. Make sure that handrails are as long as the stairways. Check any carpeting to make sure that it is firmly attached to the stairs. Fix any carpet that is loose or worn. Avoid having throw rugs at the top or bottom of the stairs. If you do have throw rugs, attach them to the floor with carpet tape. Make sure that you have a light switch at the top of the stairs and the bottom of the stairs. If you do not have them, ask someone to add them for you. What else can I do to help prevent falls? Wear shoes that: Do not have high heels. Have rubber bottoms. Are comfortable and fit you well. Are closed at the toe. Do not wear sandals. If you use a stepladder: Make sure that it is fully opened. Do not climb a closed stepladder. Make sure that both sides of the stepladder are locked into place. Ask someone to hold it for you, if possible. Clearly  mark and make sure that you can see: Any grab bars or handrails. First and last steps. Where the edge of each step is. Use tools that help you move around (mobility aids) if they are needed. These include: Canes. Walkers. Scooters. Crutches. Turn on the lights when you go into a dark area. Replace any light bulbs as soon as they burn out. Set up your furniture so you have a clear path. Avoid moving your furniture around. If any of your floors are uneven, fix them. If there are any pets around you, be aware of where they are. Review your medicines with your doctor. Some medicines can make you feel dizzy. This can increase your chance of falling. Ask your doctor what other things that you can do to help prevent falls. This information is not intended to replace advice given to you by your health care provider. Make sure you discuss any questions you have with your health care provider. Document Released: 10/04/2009 Document Revised: 05/15/2016 Document Reviewed: 01/12/2015 Elsevier Interactive Patient Education  2017 [employer].

## 2022-10-15 NOTE — Progress Notes (Signed)
Subjective:   Aul Mangieri is a 66 y.o. male who presents for Medicare Annual/Subsequent preventive examination.  I connected with  Keturah Barre on 10/15/22 by a audio enabled telemedicine application and verified that I am speaking with the correct person using two identifiers.  Patient Location: Home  Provider Location: Office/Clinic  I discussed the limitations of evaluation and management by telemedicine. The patient expressed understanding and agreed to proceed.   Review of Systems    Defer to PCP Cardiac Risk Factors include: dyslipidemia;advanced age (>25men, >69 women);male gender;sedentary lifestyle     Objective:    There were no vitals filed for this visit. There is no height or weight on file to calculate BMI.     10/15/2022    2:21 PM 09/23/2021   10:35 AM  Advanced Directives  Does Patient Have a Medical Advance Directive? No No  Would patient like information on creating a medical advance directive? No - Patient declined No - Patient declined    Current Medications (verified) Outpatient Encounter Medications as of 10/15/2022  Medication Sig   ADVAIR HFA 115-21 MCG/ACT inhaler Inhale 2 puffs into the lungs as needed for shortness of breath or wheezing. (Patient not taking: Reported on 09/23/2021)   albuterol (PROVENTIL) (2.5 MG/3ML) 0.083% nebulizer solution Take 3 mLs (2.5 mg total) by nebulization every 6 (six) hours as needed for wheezing or shortness of breath.   albuterol (VENTOLIN HFA) 108 (90 Base) MCG/ACT inhaler Inhale 2 puffs into the lungs every 6 (six) hours as needed for wheezing or shortness of breath.   aspirin 81 MG chewable tablet Chew 81 mg by mouth daily.   atorvastatin (LIPITOR) 20 MG tablet Take 1 tablet (20 mg total) by mouth daily.   Budeson-Glycopyrrol-Formoterol (BREZTRI AEROSPHERE) 160-9-4.8 MCG/ACT AERO Inhale 2 puffs into the lungs in the morning and at bedtime.   Multiple Vitamins-Minerals (ZINC PO) Take 1 tablet by mouth  daily. Unknown strenght   Omega-3 Fatty Acids (FISH OIL PO) Take 1 tablet by mouth daily.   omeprazole (PRILOSEC) 20 MG capsule Take 20 mg by mouth daily.   predniSONE (DELTASONE) 10 MG tablet Take 4 tabs by mouth once daily x4 days, then 3 tabs x4 days, 2 tabs x4 days, 1 tab x4 days and stop.   Spacer/Aero-Holding Chambers (AEROCHAMBER MV) inhaler Use as instructed   VITAMIN D PO Take 1 tablet by mouth daily. Unknown strength   No facility-administered encounter medications on file as of 10/15/2022.    Allergies (verified) Patient has no known allergies.   History: Past Medical History:  Diagnosis Date   Asthma    Environmental allergies    Past Surgical History:  Procedure Laterality Date   CHOLECYSTECTOMY     SPINE SURGERY     TONSILLECTOMY     Family History  Problem Relation Age of Onset   Asthma Mother    Heart disease Father    Stroke Father    Social History   Socioeconomic History   Marital status: Married    Spouse name: Not on file   Number of children: Not on file   Years of education: Not on file   Highest education level: Not on file  Occupational History   Not on file  Tobacco Use   Smoking status: Never   Smokeless tobacco: Current    Types: Chew  Vaping Use   Vaping Use: Never used  Substance and Sexual Activity   Alcohol use: Yes    Alcohol/week: 12.0 standard drinks  of alcohol    Types: 12 Cans of beer per week    Comment: occasionally   Drug use: Not on file   Sexual activity: Not Currently  Other Topics Concern   Not on file  Social History Narrative   Not on file   Social Determinants of Health   Financial Resource Strain: Medium Risk (09/23/2021)   Overall Financial Resource Strain (CARDIA)    Difficulty of Paying Living Expenses: Somewhat hard  Food Insecurity: No Food Insecurity (09/23/2021)   Hunger Vital Sign    Worried About Running Out of Food in the Last Year: Never true    Ran Out of Food in the Last Year: Never true   Transportation Needs: No Transportation Needs (09/23/2021)   PRAPARE - Administrator, Civil Service (Medical): No    Lack of Transportation (Non-Medical): No  Physical Activity: Inactive (09/23/2021)   Exercise Vital Sign    Days of Exercise per Week: 0 days    Minutes of Exercise per Session: 0 min  Stress: Stress Concern Present (09/23/2021)   [employer] of Occupational Health - Occupational Stress Questionnaire    Feeling of Stress : To some extent  Social Connections: Socially Isolated (09/23/2021)   Social Connection and Isolation Panel [NHANES]    Frequency of Communication with Friends and Family: Once a week    Frequency of Social Gatherings with Friends and Family: Once a week    Attends Religious Services: Never    Database administrator or Organizations: No    Attends Engineer, structural: Never    Marital Status: Married    Tobacco Counseling Ready to quit: Not Answered Counseling given: Not Answered   Clinical Intake:  Pre-visit preparation completed: Yes  Pain : No/denies pain  How often do you need to have someone help you when you read instructions, pamphlets, or other written materials from your doctor or pharmacy?: 1 - Never  Diabetic? No  Activities of Daily Living    10/15/2022    2:25 PM  In your present state of health, do you have any difficulty performing the following activities:  Hearing? 0  Vision? 0  Difficulty concentrating or making decisions? 0  Walking or climbing stairs? 1  Comment difficulty with breathing  Dressing or bathing? 0  Doing errands, shopping? 0  Preparing Food and eating ? N  Using the Toilet? N  In the past six months, have you accidently leaked urine? N  Do you have problems with loss of bowel control? Y  Managing your Medications? N  Managing your Finances? N  Housekeeping or managing your Housekeeping? N    Patient Care Team: Olive Bass, FNP as PCP - General (Internal  Medicine)  Indicate any recent Medical Services you may have received from other than [facility] providers in the past year (date may be approximate).     Assessment:   This is a routine wellness examination for [place].  Hearing/Vision screen No results found.  Dietary issues and exercise activities discussed: Current Exercise Habits: The patient does not participate in regular exercise at present, Exercise limited by: respiratory conditions(s)   Goals Addressed             This Visit's Progress    Patient Stated   On track    Work on breathing issues       Depression Screen    10/15/2022    2:24 PM 09/23/2021   10:37 AM 07/09/2021   10:43  AM  PHQ 2/9 Scores  PHQ - 2 Score 0 0 0    Fall Risk    10/15/2022    2:22 PM 09/23/2021   10:36 AM 07/09/2021   10:43 AM  Fall Risk   Falls in the past year? 1 1 1   Number falls in past yr: 0 0 1  Injury with Fall? 0 0 0  Risk for fall due to : History of fall(s) History of fall(s) Impaired balance/gait  Follow up Falls evaluation completed Falls prevention discussed Falls evaluation completed    FALL RISK PREVENTION PERTAINING TO THE HOME:  Any stairs in or around the home? No  If so, are there any without handrails?  N/a Home free of loose throw rugs in walkways, pet beds, electrical cords, etc? Yes  Adequate lighting in your home to reduce risk of falls? Yes   ASSISTIVE DEVICES UTILIZED TO PREVENT FALLS:  Life alert? No  Use of a cane, walker or w/c? No  Grab bars in the bathroom? No  Shower chair or bench in shower? Yes  Elevated toilet seat or a handicapped toilet? No   TIMED UP AND GO:  Was the test performed?  No, audio visit .    Cognitive Function:        10/15/2022    2:34 PM  6CIT Screen  What Year? 0 points  What month? 0 points  What time? 0 points  Count back from 20 0 points  Months in reverse 2 points  Repeat phrase 6 points  Total Score 8 points    Immunizations  There is no  immunization history on file for this patient.  TDAP status: Due, Education has been provided regarding the importance of this vaccine. Advised may receive this vaccine at local pharmacy or Health Dept. Aware to provide a copy of the vaccination record if obtained from local pharmacy or Health Dept. Verbalized acceptance and understanding.  Flu Vaccine status: Due, Education has been provided regarding the importance of this vaccine. Advised may receive this vaccine at local pharmacy or Health Dept. Aware to provide a copy of the vaccination record if obtained from local pharmacy or Health Dept. Verbalized acceptance and understanding.  Pneumococcal vaccine status: Due, Education has been provided regarding the importance of this vaccine. Advised may receive this vaccine at local pharmacy or Health Dept. Aware to provide a copy of the vaccination record if obtained from local pharmacy or Health Dept. Verbalized acceptance and understanding.  Covid-19 vaccine status: Information provided on how to obtain vaccines.   Qualifies for Shingles Vaccine? Yes   Zostavax completed No   Shingrix Completed?: No.    Education has been provided regarding the importance of this vaccine. Patient has been advised to call insurance company to determine out of pocket expense if they have not yet received this vaccine. Advised may also receive vaccine at local pharmacy or Health Dept. Verbalized acceptance and understanding.  Screening Tests Health Maintenance  Topic Date Due   COVID-19 Vaccine (1) Never done   Hepatitis C Screening  Never done   TETANUS/TDAP  Never done   COLONOSCOPY (Pts 45-22yrs Insurance coverage will need to be confirmed)  Never done   Zoster Vaccines- Shingrix (1 of 2) Never done   Pneumonia Vaccine 69+ Years old (1 - PCV) Never done   INFLUENZA VACCINE  Never done   Medicare Annual Wellness (AWV)  10/23/2022   HPV VACCINES  Aged Out    Health Maintenance  Health Maintenance Due  Topic Date Due   COVID-19 Vaccine (1) Never done   Hepatitis C Screening  Never done   TETANUS/TDAP  Never done   COLONOSCOPY (Pts 45-2yrs Insurance coverage will need to be confirmed)  Never done   Zoster Vaccines- Shingrix (1 of 2) Never done   Pneumonia Vaccine 61+ Years old (1 - PCV) Never done   INFLUENZA VACCINE  Never done   Medicare Annual Wellness (AWV)  10/23/2022    Colorectal cancer screening: Referral to GI placed 10/15/22. Pt aware the office will call re: appt.  Lung Cancer Screening: (Low Dose CT Chest recommended if Age 30-80 years, 30 pack-year currently smoking OR have quit w/in 15years.) does not qualify.   Lung Cancer Screening Referral: N/a  Additional Screening:  Hepatitis C Screening: does qualify; Completed N/a  Vision Screening: Recommended annual ophthalmology exams for early detection of glaucoma and other disorders of the eye. Is the patient up to date with their annual eye exam?  Yes  Who is the provider or what is the name of the office in which the patient attends annual eye exams? Doesn't remember name  If pt is not established with a provider, would they like to be referred to a provider to establish care? No .   Dental Screening: Recommended annual dental exams for proper oral hygiene  Community Resource Referral / Chronic Care Management: CRR required this visit?  No   CCM required this visit?  No      Plan:     I have personally reviewed and noted the following in the patient's chart:   Medical and social history Use of alcohol, tobacco or illicit drugs  Current medications and supplements including opioid prescriptions. Patient is not currently taking opioid prescriptions. Functional ability and status Nutritional status Physical activity Advanced directives List of other physicians Hospitalizations, surgeries, and ER visits in previous 12 months Vitals Screenings to include cognitive, depression, and falls Referrals and  appointments  In addition, I have reviewed and discussed with patient certain preventive protocols, quality metrics, and best practice recommendations. A written personalized care plan for preventive services as well as general preventive health recommendations were provided to patient.   Due to this being a telephonic visit, the after visit summary with patients personalized plan was offered to patient via mail or my-chart. Per request, patient was mailed a copy of AVS.  Donne Anon, CMA   10/15/2022   Nurse Notes: None

## 2022-10-17 ENCOUNTER — Ambulatory Visit (INDEPENDENT_AMBULATORY_CARE_PROVIDER_SITE_OTHER): Payer: Medicare Other | Admitting: Family

## 2022-10-17 ENCOUNTER — Encounter: Payer: Self-pay | Admitting: Family

## 2022-10-17 VITALS — BP 138/88 | HR 110 | Temp 98.1°F | Ht 74.0 in | Wt 233.4 lb

## 2022-10-17 DIAGNOSIS — H6191 Disorder of right external ear, unspecified: Secondary | ICD-10-CM

## 2022-10-17 DIAGNOSIS — R7989 Other specified abnormal findings of blood chemistry: Secondary | ICD-10-CM | POA: Diagnosis not present

## 2022-10-17 DIAGNOSIS — R053 Chronic cough: Secondary | ICD-10-CM | POA: Diagnosis not present

## 2022-10-17 DIAGNOSIS — Z125 Encounter for screening for malignant neoplasm of prostate: Secondary | ICD-10-CM

## 2022-10-17 DIAGNOSIS — R0602 Shortness of breath: Secondary | ICD-10-CM

## 2022-10-17 DIAGNOSIS — E785 Hyperlipidemia, unspecified: Secondary | ICD-10-CM | POA: Diagnosis not present

## 2022-10-17 LAB — COMPREHENSIVE METABOLIC PANEL
ALT: 43 U/L (ref 0–53)
AST: 31 U/L (ref 0–37)
Albumin: 4.1 g/dL (ref 3.5–5.2)
Alkaline Phosphatase: 106 U/L (ref 39–117)
BUN: 5 mg/dL — ABNORMAL LOW (ref 6–23)
CO2: 26 mEq/L (ref 19–32)
Calcium: 9.2 mg/dL (ref 8.4–10.5)
Chloride: 99 mEq/L (ref 96–112)
Creatinine, Ser: 0.78 mg/dL (ref 0.40–1.50)
GFR: 93.13 mL/min (ref >60.00)
Glucose, Bld: 105 mg/dL — ABNORMAL HIGH (ref 70–99)
Potassium: 3.9 mEq/L (ref 3.5–5.1)
Sodium: 136 mEq/L (ref 135–145)
Total Bilirubin: 1 mg/dL (ref 0.2–1.2)
Total Protein: 7.5 g/dL (ref 6.0–8.3)

## 2022-10-17 LAB — CBC WITH DIFFERENTIAL/PLATELET
Basophils Absolute: 0.1 10*3/uL (ref 0.0–0.1)
Basophils Relative: 1.2 % (ref 0.0–3.0)
Eosinophils Absolute: 0.4 10*3/uL (ref 0.0–0.7)
Eosinophils Relative: 5 % (ref 0.0–5.0)
HCT: 45.9 % (ref 39.0–52.0)
Hemoglobin: 15.7 g/dL (ref 13.0–17.0)
Lymphocytes Relative: 18.8 % (ref 12.0–46.0)
Lymphs Abs: 1.6 10*3/uL (ref 0.7–4.0)
MCHC: 34.1 g/dL (ref 30.0–36.0)
MCV: 100.5 fl — ABNORMAL HIGH (ref 78.0–100.0)
Monocytes Absolute: 0.9 10*3/uL (ref 0.1–1.0)
Monocytes Relative: 10.4 % (ref 3.0–12.0)
Neutro Abs: 5.4 10*3/uL (ref 1.4–7.7)
Neutrophils Relative %: 64.6 % (ref 43.0–77.0)
Platelets: 276 10*3/uL (ref 150.0–400.0)
RBC: 4.57 Mil/uL (ref 4.22–5.81)
RDW: 13.2 % (ref 11.5–15.5)
WBC: 8.3 10*3/uL (ref 4.0–10.5)

## 2022-10-17 LAB — LIPID PANEL
Cholesterol: 173 mg/dL (ref 0–200)
HDL: 79.5 mg/dL (ref >39.00)
LDL Cholesterol: 81 mg/dL (ref 0–99)
NonHDL: 93.41
Total CHOL/HDL Ratio: 2
Triglycerides: 63 mg/dL (ref 0.0–149.0)
VLDL: 12.6 mg/dL (ref 0.0–40.0)

## 2022-10-17 LAB — PSA: PSA: 0.7 ng/mL (ref 0.10–4.00)

## 2022-10-17 LAB — VITAMIN B12: Vitamin B-12: 367 pg/mL (ref 211–911)

## 2022-10-17 MED ORDER — ATORVASTATIN CALCIUM 20 MG PO TABS: 20.0000 mg | ORAL_TABLET | Freq: Every day | ORAL | 3 refills | Status: DC

## 2022-10-17 MED ORDER — OMEPRAZOLE 20 MG PO CPDR: 20.0000 mg | DELAYED_RELEASE_CAPSULE | Freq: Every day | ORAL | 3 refills | Status: DC

## 2022-10-17 MED ORDER — TRIAMCINOLONE ACETONIDE 0.1 % EX CREA: 1.0000 | TOPICAL_CREAM | Freq: Two times a day (BID) | CUTANEOUS | 0 refills | Status: DC

## 2022-10-17 NOTE — Patient Instructions (Signed)
Please get established with a dentist to have your mouth/ tongue evaluated;  I am referring you to dermatology and pulmonology;

## 2022-10-17 NOTE — Progress Notes (Signed)
Mark Doyle is a 66 y.o. male with the following history as recorded in EpicCare:  Patient Active Problem List   Diagnosis Date Noted   Dyspnea on exertion 08/02/2021   Environmental allergies 08/01/2021   Asthma 08/01/2021   Elevated LFTs 11/08/2019   Mild intermittent asthma without complication 78/24/2353   Pure hypercholesterolemia 11/08/2019    Current Outpatient Medications  Medication Sig Dispense Refill   albuterol (PROVENTIL) (2.5 MG/3ML) 0.083% nebulizer solution Take 3 mLs (2.5 mg total) by nebulization every 6 (six) hours as needed for wheezing or shortness of breath. 120 mL 5   albuterol (VENTOLIN HFA) 108 (90 Base) MCG/ACT inhaler Inhale 2 puffs into the lungs every 6 (six) hours as needed for wheezing or shortness of breath. 18 g 4   aspirin 81 MG chewable tablet Chew 81 mg by mouth daily.     Budeson-Glycopyrrol-Formoterol (BREZTRI AEROSPHERE) 160-9-4.8 MCG/ACT AERO Inhale 2 puffs into the lungs in the morning and at bedtime. 10.7 g 11   Omega-3 Fatty Acids (FISH OIL PO) Take 1 tablet by mouth daily.     Spacer/Aero-Holding Chambers (AEROCHAMBER MV) inhaler Use as instructed 1 each 0   triamcinolone cream (KENALOG) 0.1 % Apply 1 Application topically 2 (two) times daily. 30 g 0   VITAMIN D PO Take 1 tablet by mouth daily. Unknown strength     atorvastatin (LIPITOR) 20 MG tablet Take 1 tablet (20 mg total) by mouth daily. 90 tablet 3   omeprazole (PRILOSEC) 20 MG capsule Take 1 capsule (20 mg total) by mouth daily. 90 capsule 3   No current facility-administered medications for this visit.    Allergies: Patient has no known allergies.  Past Medical History:  Diagnosis Date   Asthma    Environmental allergies     Past Surgical History:  Procedure Laterality Date   CHOLECYSTECTOMY     SPINE SURGERY     TONSILLECTOMY      Family History  Problem Relation Age of Onset   Asthma Mother    Heart disease Father    Stroke Father     Social History   Tobacco  Use   Smoking status: Never   Smokeless tobacco: Current    Types: Chew  Substance Use Topics   Alcohol use: Yes    Alcohol/week: 12.0 standard drinks of alcohol    Types: 12 Cans of beer per week    Comment: occasionally    Subjective:   Concerned about scabbing area behind right ear that has been present "for years"- has actually seen dermatology at some point in the past; does feel that area is getting worse;   Also feels that his breathing is worsening- has been cleared by cardiology; under the care of pulmonology; is taking Breztri regularly; has not done any type of follow up with pulmonologist;   Also feels that his tongue is "burning/ more sensitive"- has not seen a dentist "in years." Does use oral tobacco products- does not smoke;      Objective:  Vitals:   10/17/22 0928  BP: 138/88  Pulse: (!) 110  Temp: 98.1 F (36.7 C)  TempSrc: Oral  SpO2: 93%  Weight: 233 lb 6.4 oz (105.9 kg)  Height: 6' 2" (1.88 m)    General: Well developed, well nourished, in no acute distress  Skin : Warm and dry.  Head: Normocephalic and atraumatic  Eyes: Sclera and conjunctiva clear; pupils round and reactive to light; extraocular movements intact  Ears: External normal; canals clear; tympanic  membranes normal  Oropharynx: Pink, supple. No suspicious lesions  Neck: Supple without thyromegaly, adenopathy  Lungs: Respirations unlabored; wheezing noted in all 4 lobes CVS exam: normal rate and regular rhythm.  Neurologic: Alert and oriented; speech intact; face symmetrical; moves all extremities well; CNII-XII intact without focal deficit   Assessment:  1. Skin lesion of right ear   2. Shortness of breath   3. Chronic cough   4. Hyperlipidemia, unspecified hyperlipidemia type   5. Prostate cancer screening   6. Low vitamin B12 level     Plan:  ? Skin cancer vs atypical psoriasis; Rx for Triamcinolone cream- apply bid to affected area; refer to dermatology- stressed need to follow  up there for biopsy; &3. Update chest CT; refer back to pulmonology- ? If candidate for injectable medication; 4.   Stressed need to take Lipitor daily; update labs; 5.  Check PSA today; 6.   Check B12 level today- discussed that this could explain some of the sensation with his tongue but very important that he get established with dentist for oral evaluation; may also need to consider ENT evaluation.   No follow-ups on file.  Orders Placed This Encounter  Procedures   CT Chest Wo Contrast    Standing Status:   Future    Standing Expiration Date:   10/18/2023    Order Specific Question:   Preferred imaging location?    Answer:   [facility]   B12   CBC with Differential/Platelet   Comp Met (CMET)   Lipid panel   PSA   Ambulatory referral to Dermatology    Referral Priority:   Routine    Referral Type:   Consultation    Referral Reason:   Specialty Services Required    Requested Specialty:   Dermatology    Number of Visits Requested:   1   Ambulatory referral to Pulmonology    Referral Priority:   Routine    Referral Type:   Consultation    Referral Reason:   Specialty Services Required    Referred to Provider:   Garner Nash, DO    Requested Specialty:   Pulmonary Disease    Number of Visits Requested:   1    Requested Prescriptions   Signed Prescriptions Disp Refills   omeprazole (PRILOSEC) 20 MG capsule 90 capsule 3    Sig: Take 1 capsule (20 mg total) by mouth daily.   triamcinolone cream (KENALOG) 0.1 % 30 g 0    Sig: Apply 1 Application topically 2 (two) times daily.   atorvastatin (LIPITOR) 20 MG tablet 90 tablet 3    Sig: Take 1 tablet (20 mg total) by mouth daily.

## 2022-10-25 ENCOUNTER — Ambulatory Visit (HOSPITAL_BASED_OUTPATIENT_CLINIC_OR_DEPARTMENT_OTHER)
Admission: RE | Admit: 2022-10-25 | Discharge: 2022-10-25 | Disposition: A | Discharge status: Home / Self Care | Payer: Medicare Other | Source: Ambulatory Visit | Attending: Family | Admitting: Family

## 2022-10-25 DIAGNOSIS — R0602 Shortness of breath: Secondary | ICD-10-CM | POA: Insufficient documentation

## 2022-10-25 DIAGNOSIS — R053 Chronic cough: Secondary | ICD-10-CM | POA: Diagnosis not present

## 2022-10-25 DIAGNOSIS — J8410 Pulmonary fibrosis, unspecified: Secondary | ICD-10-CM | POA: Diagnosis not present

## 2022-10-27 ENCOUNTER — Telehealth: Payer: Self-pay | Admitting: Family

## 2022-10-27 NOTE — Telephone Encounter (Signed)
Copied from [place] [id number]. Topic: Medicare AWV >> Oct 27, 2022  1:22 PM Gillis Santa wrote: Reason for CRM:   LVM FOR PATIENT TO CALL [phone] TO SCHEDULE AWV WITH HEALTH COACH

## 2022-10-28 DIAGNOSIS — L408 Other psoriasis: Secondary | ICD-10-CM | POA: Diagnosis not present

## 2022-10-29 ENCOUNTER — Other Ambulatory Visit: Payer: Self-pay | Admitting: Family

## 2022-10-29 DIAGNOSIS — I7090 Unspecified atherosclerosis: Secondary | ICD-10-CM

## 2022-10-29 DIAGNOSIS — R9389 Abnormal findings on diagnostic imaging of other specified body structures: Secondary | ICD-10-CM

## 2022-11-18 ENCOUNTER — Encounter: Payer: Self-pay | Admitting: Internal Medicine

## 2022-11-24 ENCOUNTER — Ambulatory Visit (INDEPENDENT_AMBULATORY_CARE_PROVIDER_SITE_OTHER): Payer: Medicare Other | Admitting: Pulmonary Disease

## 2022-11-24 ENCOUNTER — Encounter: Payer: Self-pay | Admitting: Pulmonary Disease

## 2022-11-24 VITALS — BP 120/80 | HR 97 | Temp 97.7°F | Wt 233.0 lb

## 2022-11-24 DIAGNOSIS — J4550 Severe persistent asthma, uncomplicated: Secondary | ICD-10-CM | POA: Diagnosis not present

## 2022-11-24 DIAGNOSIS — J449 Chronic obstructive pulmonary disease, unspecified: Secondary | ICD-10-CM | POA: Diagnosis not present

## 2022-11-24 DIAGNOSIS — J849 Interstitial pulmonary disease, unspecified: Secondary | ICD-10-CM | POA: Diagnosis not present

## 2022-11-24 MED ORDER — BREZTRI AEROSPHERE 160-9-4.8 MCG/ACT IN AERO: 2.0000 | INHALATION_SPRAY | Freq: Two times a day (BID) | RESPIRATORY_TRACT | 11 refills | Status: DC

## 2022-11-24 MED ORDER — ALBUTEROL SULFATE HFA 108 (90 BASE) MCG/ACT IN AERS: 2.0000 | INHALATION_SPRAY | Freq: Four times a day (QID) | RESPIRATORY_TRACT | 4 refills | Status: DC | PRN

## 2022-11-24 MED ORDER — ALBUTEROL SULFATE (2.5 MG/3ML) 0.083% IN NEBU: 2.5000 mg | INHALATION_SOLUTION | Freq: Four times a day (QID) | RESPIRATORY_TRACT | 5 refills | Status: DC | PRN

## 2022-11-24 NOTE — Patient Instructions (Addendum)
Thank you for visiting Dr. Tonia Brooms at [facility]. Today we recommend the following:  Orders Placed This Encounter  Procedures   CT CHEST HIGH RESOLUTION   Pulmonary Function Test   Meds ordered this encounter  Medications   albuterol (PROVENTIL) (2.5 MG/3ML) 0.083% nebulizer solution    Sig: Take 3 mLs (2.5 mg total) by nebulization every 6 (six) hours as needed for wheezing or shortness of breath.    Dispense:  120 mL    Refill:  5    J45.20 dx   albuterol (VENTOLIN HFA) 108 (90 Base) MCG/ACT inhaler    Sig: Inhale 2 puffs into the lungs every 6 (six) hours as needed for wheezing or shortness of breath.    Dispense:  18 g    Refill:  4   Budeson-Glycopyrrol-Formoterol (BREZTRI AEROSPHERE) 160-9-4.8 MCG/ACT AERO    Sig: Inhale 2 puffs into the lungs in the morning and at bedtime.    Dispense:  10.7 g    Refill:  11   ILD Questionnaire today, appt in 6 months with MR   Return in about 6 months (around 05/26/2023) for follow appt with Dr. Marchelle Gearing .    Please do your part to reduce the spread of COVID-19.

## 2022-11-24 NOTE — Progress Notes (Signed)
Synopsis: Referred in June 2022 for self referral, asthma PCP:  by Marrian Salvage,*  Subjective:   PATIENT ID: Mark Doyle GENDER: male DOB: 05-17-1956, MRN: [id number]  Chief Complaint  Patient presents with   Follow-up    SOB increased     PMH asthma, seasonal allergies, HTN, never smoker. He is SOB when he walks. He uses his sister nebulizer and OTC primatime mist.  Here today to establish care with new primary pulmonologist.  He does not have a primary care doctor.  He does have ongoing symptoms of dyspnea on exertion.  Nocturnal cough.  He has had asthma for a long time.  Still using over-the-counter Primatene Mist.  And using his sister's nebulizer machine on occasion.  He does not have any maintenance inhalers or no albuterol to use at home.  He does have seasonal allergies.  No other significant past medical history.  Please see below.  Patient had a coughing episode a few weeks ago developed right-sided abdominal and flank pain.  He feels like he pulled a muscle its been sore.  He has been using a heating pad regularly.  OV 10/02/2021: Here today for follow-up after pulmonary function tests.  He has a new FEV1 of only 36% predicted with significant bronchodilator response, significant air trapping.  The non-smoker with a longstanding history of asthma.  Is like he has had significant airway remodeling.  He has not been using a maintenance inhaler.  He did not like using Advair or Breo.  It caused him to have significant sore throat.  So he stopped using it.  Only using albuterol at this time.  Presents today to the office with ongoing cough chest pain shortness of breath.  He feels very raspy at times and still having ongoing wheezing which is worse at nighttime.  OV 04/07/2022: Here today for follow-up regarding severe obstructive disease.  Presumed related to longstanding history of asthma with airway remodeling.  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  However he has not had any exacerbations in the past year.  Doing well with his Breztri inhaler with spacer.  He has also gotten rid of his sore throat that he had associated with use of his previous inhalers.  He does much better on MDI than having a dry powder.  He is able to complete his activities of daily living but he does have some shortness of breath still with exertion.  OV 11/24/2022: Here today for follow-up regarding severe obstructive disease longstanding history of asthma presumed related to airway remodeling.  Does have history of chemical exposures while working in [place] at a Engineer, manufacturing facility.  Has been using his inhalers regularly.  Had a recent CT scan of the chest that shows some peripheral interstitial changes.     Past Medical History:  Diagnosis Date   Asthma    Environmental allergies      Family History  Problem Relation Age of Onset   Asthma Mother    Heart disease Father    Stroke Father      Past Surgical History:  Procedure Laterality Date   CHOLECYSTECTOMY     SPINE SURGERY     TONSILLECTOMY      Social History   Socioeconomic History   Marital status: Married    Spouse name: Not on file   Number of children: Not on file   Years of education: Not on file  Highest education level: Not on file  Occupational History   Not on file  Tobacco Use   Smoking status: Never   Smokeless tobacco: Current    Types: Chew  Vaping Use   Vaping Use: Never used  Substance and Sexual Activity   Alcohol use: Yes    Alcohol/week: 12.0 standard drinks of alcohol    Types: 12 Cans of beer per week    Comment: occasionally   Drug use: Not on file   Sexual activity: Not Currently  Other Topics Concern   Not on file  Social History Narrative   Not on file   Social Determinants of Health   Financial Resource Strain: Medium Risk (09/23/2021)   Overall Financial Resource Strain (CARDIA)     Difficulty of Paying Living Expenses: Somewhat hard  Food Insecurity: No Food Insecurity (09/23/2021)   Hunger Vital Sign    Worried About Running Out of Food in the Last Year: Never true    Ran Out of Food in the Last Year: Never true  Transportation Needs: No Transportation Needs (09/23/2021)   PRAPARE - Hydrologist (Medical): No    Lack of Transportation (Non-Medical): No  Physical Activity: Inactive (09/23/2021)   Exercise Vital Sign    Days of Exercise per Week: 0 days    Minutes of Exercise per Session: 0 min  Stress: Stress Concern Present (09/23/2021)   [place]    Feeling of Stress : To some extent  Social Connections: Socially Isolated (09/23/2021)   Social Connection and Isolation Panel [NHANES]    Frequency of Communication with Friends and Family: Once a week    Frequency of Social Gatherings with Friends and Family: Once a week    Attends Religious Services: Never    Marine scientist or Organizations: No    Attends Archivist Meetings: Never    Marital Status: Married  Human resources officer Violence: Not At Risk (09/23/2021)   Humiliation, Afraid, Rape, and Kick questionnaire    Fear of Current or Ex-Partner: No    Emotionally Abused: No    Physically Abused: No    Sexually Abused: No     No Known Allergies   Outpatient Medications Prior to Visit  Medication Sig Dispense Refill   aspirin 81 MG chewable tablet Chew 81 mg by mouth daily.     atorvastatin (LIPITOR) 20 MG tablet Take 1 tablet (20 mg total) by mouth daily. 90 tablet 3   Omega-3 Fatty Acids (FISH OIL PO) Take 1 tablet by mouth daily.     omeprazole (PRILOSEC) 20 MG capsule Take 1 capsule (20 mg total) by mouth daily. 90 capsule 3   Spacer/Aero-Holding Chambers (AEROCHAMBER MV) inhaler Use as instructed 1 each 0   triamcinolone cream (KENALOG) 0.1 % Apply 1 Application topically 2 (two) times daily.  30 g 0   VITAMIN D PO Take 1 tablet by mouth daily. Unknown strength     albuterol (PROVENTIL) (2.5 MG/3ML) 0.083% nebulizer solution Take 3 mLs (2.5 mg total) by nebulization every 6 (six) hours as needed for wheezing or shortness of breath. 120 mL 5   albuterol (VENTOLIN HFA) 108 (90 Base) MCG/ACT inhaler Inhale 2 puffs into the lungs every 6 (six) hours as needed for wheezing or shortness of breath. 18 g 4   Budeson-Glycopyrrol-Formoterol (BREZTRI AEROSPHERE) 160-9-4.8 MCG/ACT AERO Inhale 2 puffs into the lungs in the morning and at bedtime. 10.7 g 11  No facility-administered medications prior to visit.    Review of Systems  Constitutional:  Negative for chills, fever, malaise/fatigue and weight loss.  HENT:  Negative for hearing loss, sore throat and tinnitus.   Eyes:  Negative for blurred vision and double vision.  Respiratory:  Positive for cough and shortness of breath. Negative for hemoptysis, sputum production, wheezing and stridor.   Cardiovascular:  Negative for chest pain, palpitations, orthopnea, leg swelling and PND.  Gastrointestinal:  Negative for abdominal pain, constipation, diarrhea, heartburn, nausea and vomiting.  Genitourinary:  Negative for dysuria, hematuria and urgency.  Musculoskeletal:  Negative for joint pain and myalgias.  Skin:  Negative for itching and rash.  Neurological:  Negative for dizziness, tingling, weakness and headaches.  Endo/Heme/Allergies:  Negative for environmental allergies. Does not bruise/bleed easily.  Psychiatric/Behavioral:  Negative for depression. The patient is not nervous/anxious and does not have insomnia.   All other systems reviewed and are negative.    Objective:  Physical Exam Vitals reviewed.  Constitutional:      General: He is not in acute distress.    Appearance: He is well-developed.  HENT:     Head: Normocephalic and atraumatic.  Eyes:     General: No scleral icterus.    Conjunctiva/sclera: Conjunctivae normal.      Pupils: Pupils are equal, round, and reactive to light.  Neck:     Vascular: No JVD.     Trachea: No tracheal deviation.  Cardiovascular:     Rate and Rhythm: Normal rate and regular rhythm.     Heart sounds: Normal heart sounds. No murmur heard. Pulmonary:     Effort: Pulmonary effort is normal. No tachypnea, accessory muscle usage or respiratory distress.     Breath sounds: No stridor. No wheezing, rhonchi or rales.  Abdominal:     General: There is no distension.     Palpations: Abdomen is soft.     Tenderness: There is no abdominal tenderness.  Musculoskeletal:        General: No tenderness.     Cervical back: Neck supple.  Lymphadenopathy:     Cervical: No cervical adenopathy.  Skin:    General: Skin is warm and dry.     Capillary Refill: Capillary refill takes less than 2 seconds.     Findings: No rash.  Neurological:     Mental Status: He is alert and oriented to person, place, and time.  Psychiatric:        Behavior: Behavior normal.      Vitals:   11/24/22 1056  BP: 120/80  Pulse: 97  Temp: 97.7 F (36.5 C)  TempSrc: Oral  SpO2: 93%  Weight: 233 lb (105.7 kg)   93% on RA BMI Readings from Last 3 Encounters:  11/24/22 29.92 kg/m  10/17/22 29.97 kg/m  04/07/22 30.35 kg/m   Wt Readings from Last 3 Encounters:  11/24/22 233 lb (105.7 kg)  10/17/22 233 lb 6.4 oz (105.9 kg)  04/07/22 236 lb 6.4 oz (107.2 kg)     CBC    Component Value Date/Time   WBC 8.3 10/17/2022 0956   RBC 4.57 10/17/2022 0956   HGB 15.7 10/17/2022 0956   HCT 45.9 10/17/2022 0956   PLT 276.0 10/17/2022 0956   MCV 100.5 (H) 10/17/2022 0956   MCHC 34.1 10/17/2022 0956   RDW 13.2 10/17/2022 0956   LYMPHSABS 1.6 10/17/2022 0956   MONOABS 0.9 10/17/2022 0956   EOSABS 0.4 10/17/2022 0956   BASOSABS 0.1 10/17/2022 0956  Chest Imaging: Chest x-ray July 2022: Relatively clear, no effusion Few bilateral coarse interstitial markings. The patient's images have been  independently reviewed by me.    November 2023: CT scan of the chest reveals atherosclerotic coronary disease as well as some peripheral parenchymal lung changes consistent with interstitial lung disease it is mild however. The patient's images have been independently reviewed by me.    Pulmonary Functions Testing Results:    Latest Ref Rng & Units 10/02/2021    9:14 AM  PFT Results  FVC-Pre L 2.10   FVC-Predicted Pre % 40   FVC-Post L 2.28   FVC-Predicted Post % 44   Pre FEV1/FVC % % 58   Post FEV1/FCV % % 60   FEV1-Pre L 1.22   FEV1-Predicted Pre % 31   FEV1-Post L 1.38   DLCO uncorrected ml/min/mmHg 17.43   DLCO UNC% % 59   DLCO corrected ml/min/mmHg 17.43   DLCO COR %Predicted % 59   DLVA Predicted % 97   TLC L 5.84   TLC % Predicted % 76   RV % Predicted % 132     FeNO:   Pathology:   Echocardiogram:   Heart Catheterization:     Assessment & Plan:     ICD-10-CM   1. Obstructive lung disease (generalized) (HCC)  J44.9 CT CHEST HIGH RESOLUTION    Pulmonary Function Test    2. Severe persistent asthma without complication  J45.50 CT CHEST HIGH RESOLUTION    Pulmonary Function Test    3. ILD (interstitial lung disease) (HCC)  J84.9 CT CHEST HIGH RESOLUTION    Pulmonary Function Test      Discussion:  This is a 66 year old gentleman, ongoing shortness of breath diagnosed with severe asthma presumed related to remodeling and some obstructive lung disease has an FEV1 of 36% predicted significant bronchodilator response on previous PFTs.  Had axial CT imaging of the chest after last office visit that was completed in November 2023 by PCP.  There is evidence of mild interstitial changes.  Plan: Continue Breztri with spacer albuterol with spacer As needed albuterol for shortness of breath and wheezing. Repeat pulmonary function test and an HRCT scan of the chest in 6 months. Will have him follow-up with Dr. Marchelle Gearing in the ILD clinic ILD questionnaire. RTC  in 6 months.    Current Outpatient Medications:    aspirin 81 MG chewable tablet, Chew 81 mg by mouth daily., Disp: , Rfl:    atorvastatin (LIPITOR) 20 MG tablet, Take 1 tablet (20 mg total) by mouth daily., Disp: 90 tablet, Rfl: 3   Omega-3 Fatty Acids (FISH OIL PO), Take 1 tablet by mouth daily., Disp: , Rfl:    omeprazole (PRILOSEC) 20 MG capsule, Take 1 capsule (20 mg total) by mouth daily., Disp: 90 capsule, Rfl: 3   Spacer/Aero-Holding Chambers (AEROCHAMBER MV) inhaler, Use as instructed, Disp: 1 each, Rfl: 0   triamcinolone cream (KENALOG) 0.1 %, Apply 1 Application topically 2 (two) times daily., Disp: 30 g, Rfl: 0   VITAMIN D PO, Take 1 tablet by mouth daily. Unknown strength, Disp: , Rfl:    albuterol (PROVENTIL) (2.5 MG/3ML) 0.083% nebulizer solution, Take 3 mLs (2.5 mg total) by nebulization every 6 (six) hours as needed for wheezing or shortness of breath., Disp: 120 mL, Rfl: 5   albuterol (VENTOLIN HFA) 108 (90 Base) MCG/ACT inhaler, Inhale 2 puffs into the lungs every 6 (six) hours as needed for wheezing or shortness of breath., Disp: 18 g, Rfl:  4   Budeson-Glycopyrrol-Formoterol (BREZTRI AEROSPHERE) 160-9-4.8 MCG/ACT AERO, Inhale 2 puffs into the lungs in the morning and at bedtime., Disp: 10.7 g, Rfl: 11   Garner Nash, DO [facility] 11/24/2022 11:22 AM

## 2022-11-25 ENCOUNTER — Ambulatory Visit: Payer: Medicare Other | Attending: Cardiology | Admitting: Cardiology

## 2022-11-25 ENCOUNTER — Encounter: Payer: Self-pay | Admitting: Cardiology

## 2022-11-25 VITALS — BP 130/90 | HR 102 | Ht 74.0 in | Wt 233.0 lb

## 2022-11-25 DIAGNOSIS — E7800 Pure hypercholesterolemia, unspecified: Secondary | ICD-10-CM | POA: Diagnosis not present

## 2022-11-25 DIAGNOSIS — J449 Chronic obstructive pulmonary disease, unspecified: Secondary | ICD-10-CM | POA: Diagnosis not present

## 2022-11-25 DIAGNOSIS — I2584 Coronary atherosclerosis due to calcified coronary lesion: Secondary | ICD-10-CM

## 2022-11-25 DIAGNOSIS — I2510 Atherosclerotic heart disease of native coronary artery without angina pectoris: Secondary | ICD-10-CM

## 2022-11-25 DIAGNOSIS — R0609 Other forms of dyspnea: Secondary | ICD-10-CM | POA: Diagnosis not present

## 2022-11-25 NOTE — Patient Instructions (Addendum)

## 2022-11-25 NOTE — Progress Notes (Signed)
Cardiology Office Note:    Date:  11/25/2022  ID:  Mark Doyle, DOB 07-19-1956, MRN [id number]  PCP:  Olive Bass, FNP  Cardiologist:  Gypsy Balsam, MD    Referring MD: Olive Bass,*   Chief Complaint  Patient presents with   Abnormsal CT done on 10/25/2022    History of Present Illness:    Mark Doyle is a 66 y.o. male  I did see him a year ago when he was referred to [place] because of dyspnea on exertion, cardiac evaluation at that time included echocardiogram showing preserved ejection fraction, he also got a stress test which was negative.  He eventually end up having CT of the chest done which showed some lung issue but surprisingly also atherosclerosis of the aorta as well as atherosclerosis of the coronary artery.  He was referred to [place] for evaluation of this problem.  His ability to exercise is severely limited because of shortness of breath.  He gets short of breath easily even walking from parking lot to our office.  He denies have any chest pain tightness squeezing pressure burning chest that would suggest obstructive disease but I am afraid he is not able to accomplish level of exercise that we will arrange angina trash hold.  Past Medical History:  Diagnosis Date   Asthma    Environmental allergies     Past Surgical History:  Procedure Laterality Date   CHOLECYSTECTOMY     SPINE SURGERY     TONSILLECTOMY      Current Medications: Current Meds  Medication Sig   albuterol (PROVENTIL) (2.5 MG/3ML) 0.083% nebulizer solution Take 3 mLs (2.5 mg total) by nebulization every 6 (six) hours as needed for wheezing or shortness of breath.   albuterol (VENTOLIN HFA) 108 (90 Base) MCG/ACT inhaler Inhale 2 puffs into the lungs every 6 (six) hours as needed for wheezing or shortness of breath.   aspirin 81 MG chewable tablet Chew 81 mg by mouth daily.   atorvastatin (LIPITOR) 20 MG tablet Take 1 tablet (20 mg total) by mouth daily.    Budeson-Glycopyrrol-Formoterol (BREZTRI AEROSPHERE) 160-9-4.8 MCG/ACT AERO Inhale 2 puffs into the lungs in the morning and at bedtime.   Omega-3 Fatty Acids (FISH OIL PO) Take 1 tablet by mouth daily.   omeprazole (PRILOSEC) 20 MG capsule Take 1 capsule (20 mg total) by mouth daily.   Spacer/Aero-Holding Chambers (AEROCHAMBER MV) inhaler Use as instructed (Patient taking differently: 1 each by Other route as directed. Use as instructed)   triamcinolone cream (KENALOG) 0.1 % Apply 1 Application topically 2 (two) times daily.   VITAMIN D PO Take 1 tablet by mouth daily. Unknown strength     Allergies:   Patient has no known allergies.   Social History   Socioeconomic History   Marital status: Married    Spouse name: Not on file   Number of children: Not on file   Years of education: Not on file   Highest education level: Not on file  Occupational History   Not on file  Tobacco Use   Smoking status: Never   Smokeless tobacco: Current    Types: Chew  Vaping Use   Vaping Use: Never used  Substance and Sexual Activity   Alcohol use: Yes    Alcohol/week: 12.0 standard drinks of alcohol    Types: 12 Cans of beer per week    Comment: occasionally   Drug use: Not on file   Sexual activity: Not Currently  Other Topics Concern  Not on file  Social History Narrative   Not on file   Social Determinants of Health   Financial Resource Strain: Medium Risk (09/23/2021)   Overall Financial Resource Strain (CARDIA)    Difficulty of Paying Living Expenses: Somewhat hard  Food Insecurity: No Food Insecurity (09/23/2021)   Hunger Vital Sign    Worried About Running Out of Food in the Last Year: Never true    Ran Out of Food in the Last Year: Never true  Transportation Needs: No Transportation Needs (09/23/2021)   PRAPARE - Administrator, Civil Service (Medical): No    Lack of Transportation (Non-Medical): No  Physical Activity: Inactive (09/23/2021)   Exercise Vital Sign     Days of Exercise per Week: 0 days    Minutes of Exercise per Session: 0 min  Stress: Stress Concern Present (09/23/2021)   [employer] of Occupational Health - Occupational Stress Questionnaire    Feeling of Stress : To some extent  Social Connections: Socially Isolated (09/23/2021)   Social Connection and Isolation Panel [NHANES]    Frequency of Communication with Friends and Family: Once a week    Frequency of Social Gatherings with Friends and Family: Once a week    Attends Religious Services: Never    Database administrator or Organizations: No    Attends Engineer, structural: Never    Marital Status: Married     Family History: The patient's family history includes Asthma in his mother; Heart disease in his father; Stroke in his father. ROS:   Please see the history of present illness.    All 14 point review of systems negative except as described per history of present illness  EKGs/Labs/Other Studies Reviewed:      Recent Labs: 10/17/2022: ALT 43; BUN 5; Creatinine, Ser 0.78; Hemoglobin 15.7; Platelets 276.0; Potassium 3.9; Sodium 136  Recent Lipid Panel    Component Value Date/Time   CHOL 173 10/17/2022 0956   TRIG 63.0 10/17/2022 0956   HDL 79.50 10/17/2022 0956   CHOLHDL 2 10/17/2022 0956   VLDL 12.6 10/17/2022 0956   LDLCALC 81 10/17/2022 0956    Physical Exam:    VS:  BP (!) 130/90 (BP Location: Left Arm, Patient Position: Sitting)   Pulse (!) 102   Ht 6\' 2"  (1.88 m)   Wt 233 lb (105.7 kg)   SpO2 94%   BMI 29.92 kg/m     Wt Readings from Last 3 Encounters:  11/25/22 233 lb (105.7 kg)  11/24/22 233 lb (105.7 kg)  10/17/22 233 lb 6.4 oz (105.9 kg)     GEN:  Well nourished, well developed in no acute distress HEENT: Normal NECK: No JVD; No carotid bruits LYMPHATICS: No lymphadenopathy CARDIAC: RRR, no murmurs, no rubs, no gallops RESPIRATORY:  Clear to auscultation without rales, wheezing or rhonchi  ABDOMEN: Soft, non-tender,  non-distended MUSCULOSKELETAL:  No edema; No deformity  SKIN: Warm and dry LOWER EXTREMITIES: no swelling NEUROLOGIC:  Alert and oriented x 3 PSYCHIATRIC:  Normal affect   ASSESSMENT:    1. Chronic obstructive pulmonary disease, unspecified COPD type (HCC)   2. Coronary artery calcification   3. Dyspnea on exertion   4. Pure hypercholesterolemia    PLAN:    In order of problems listed above:  Calcification of the coronary artery as well as atherosclerosis of the aorta take: He is already on antiplatelet therapy which I will continue, he is already on statin which I will continue.  Acute  right now will be to find out if he got obstructive disease.  The couple approaches to distend the area I think the best approach will be to do echocardiogram on him if he got normal pulm artery pressure then we will schedule him to have a Lexiscan, if he does have significant pulmonary hypertension then cardiac catheterization left and right will be warranted.  He does not have any symptoms that would suggest angina but I am worried that he may not reaching the point of angina because of dyspnea on exertion on top of that shortness of breath could be sign of coronary artery disease. Dyspnea on exertion multifactorial, he is scheduled to see pulmonary. Dyslipidemia I did review his K PN which show me his LDL 81 and HDL 79.  He is on Lipitor 20 which I will continue.   Medication Adjustments/Labs and Tests Ordered: Current medicines are reviewed at length with the patient today.  Concerns regarding medicines are outlined above.  No orders of the defined types were placed in this encounter.  Medication changes: No orders of the defined types were placed in this encounter.   Signed, Georgeanna Lea, MD, [facility] 11/25/2022 10:44 AM     [facility]

## 2022-12-21 ENCOUNTER — Other Ambulatory Visit: Payer: Self-pay | Admitting: Pulmonary Disease

## 2023-01-05 ENCOUNTER — Ambulatory Visit: Payer: Medicare Other | Admitting: Pulmonary Disease

## 2023-01-07 ENCOUNTER — Ambulatory Visit (HOSPITAL_BASED_OUTPATIENT_CLINIC_OR_DEPARTMENT_OTHER)
Admission: RE | Admit: 2023-01-07 | Discharge: 2023-01-07 | Disposition: A | Discharge status: Home / Self Care | Payer: 59 | Source: Ambulatory Visit | Attending: Cardiology | Admitting: Cardiology

## 2023-01-07 DIAGNOSIS — I77819 Aortic ectasia, unspecified site: Secondary | ICD-10-CM | POA: Diagnosis not present

## 2023-01-07 DIAGNOSIS — R0609 Other forms of dyspnea: Secondary | ICD-10-CM | POA: Diagnosis not present

## 2023-01-08 LAB — ECHOCARDIOGRAM COMPLETE
AR max vel: 3.82 cm2
AV Area VTI: 4.04 cm2
AV Area mean vel: 3.83 cm2
AV Mean grad: 2.5 mmHg
AV Peak grad: 4.5 mmHg
Ao pk vel: 1.06 m/s
Area-P 1/2: 3.39 cm2
S' Lateral: 3.5 cm

## 2023-01-09 ENCOUNTER — Telehealth: Payer: Self-pay | Admitting: Cardiology

## 2023-01-09 ENCOUNTER — Telehealth: Payer: Self-pay

## 2023-01-09 DIAGNOSIS — I2510 Atherosclerotic heart disease of native coronary artery without angina pectoris: Secondary | ICD-10-CM

## 2023-01-09 DIAGNOSIS — R0609 Other forms of dyspnea: Secondary | ICD-10-CM

## 2023-01-09 NOTE — Telephone Encounter (Signed)
Results reviewed with pt as per Dr. Wendy Poet note.  Pt verbalized understanding and had no additional questions. Routed to PCP After reading previous notes, Dr. Agustin Cree had dictated that if Echo was normal would proceed with Lexi Scan. Spoke with pt. Agreed to schedule LexiScan. Order entered. Pt had no further questions.

## 2023-01-09 NOTE — Telephone Encounter (Signed)
Patient was returning call. Please advise ?

## 2023-01-14 ENCOUNTER — Telehealth: Payer: Self-pay | Admitting: Cardiology

## 2023-01-14 NOTE — Telephone Encounter (Signed)
Pt calling to make provider aware that he is not able to have ordered test done due to insurance reasons. Pt will callback to schedule once this matter is taken care of. Please advise

## 2023-01-15 ENCOUNTER — Telehealth: Payer: Self-pay | Admitting: Pulmonary Disease

## 2023-01-15 ENCOUNTER — Ambulatory Visit (INDEPENDENT_AMBULATORY_CARE_PROVIDER_SITE_OTHER): Payer: 59 | Admitting: Pulmonary Disease

## 2023-01-15 DIAGNOSIS — J449 Chronic obstructive pulmonary disease, unspecified: Secondary | ICD-10-CM

## 2023-01-15 DIAGNOSIS — J849 Interstitial pulmonary disease, unspecified: Secondary | ICD-10-CM

## 2023-01-15 DIAGNOSIS — J4550 Severe persistent asthma, uncomplicated: Secondary | ICD-10-CM

## 2023-01-15 LAB — PULMONARY FUNCTION TEST
DL/VA % pred: 93 %
DL/VA: 3.78 ml/min/mmHg/L
DLCO cor % pred: 59 %
DLCO cor: 17.27 ml/min/mmHg
DLCO unc % pred: 59 %
DLCO unc: 17.32 ml/min/mmHg
FEF 25-75 Post: 0.89 L/sec
FEF 25-75 Pre: 0.88 L/sec
FEF2575-%Change-Post: 0 %
FEF2575-%Pred-Post: 30 %
FEF2575-%Pred-Pre: 29 %
FEV1-%Change-Post: 2 %
FEV1-%Pred-Post: 41 %
FEV1-%Pred-Pre: 40 %
FEV1-Post: 1.58 L
FEV1-Pre: 1.54 L
FEV1FVC-%Change-Post: 4 %
FEV1FVC-%Pred-Pre: 86 %
FEV6-%Change-Post: 0 %
FEV6-%Pred-Post: 48 %
FEV6-%Pred-Pre: 48 %
FEV6-Post: 2.35 L
FEV6-Pre: 2.37 L
FEV6FVC-%Change-Post: 0 %
FEV6FVC-%Pred-Post: 105 %
FEV6FVC-%Pred-Pre: 104 %
FVC-%Change-Post: -1 %
FVC-%Pred-Post: 46 %
FVC-%Pred-Pre: 46 %
FVC-Post: 2.35 L
FVC-Pre: 2.38 L
Post FEV1/FVC ratio: 67 %
Post FEV6/FVC ratio: 100 %
Pre FEV1/FVC ratio: 65 %
Pre FEV6/FVC Ratio: 99 %
RV % pred: 111 %
RV: 2.83 L
TLC % pred: 75 %
TLC: 5.72 L

## 2023-01-15 NOTE — Telephone Encounter (Signed)
PT calling. Just seen. Would like to get a Madaline Savage Letter excuse due to him having to park so far away from the [employer]. Pls call to advise @ [phone]

## 2023-01-15 NOTE — Progress Notes (Signed)
Full PFT performed today.

## 2023-01-15 NOTE — Patient Instructions (Signed)
Full PFT performed today.

## 2023-01-16 ENCOUNTER — Encounter (HOSPITAL_COMMUNITY): Payer: Self-pay | Admitting: Cardiology

## 2023-01-16 NOTE — Telephone Encounter (Signed)
Called patient and he states he was just seen by you in office and he is wanting to get a jury letter excuse because he is having to park so far away and can not manage that. He is asking for a letter. I told patient I would have to ask first  Sir please advise

## 2023-01-16 NOTE — Telephone Encounter (Signed)
Attempted to call pt but unable to reach. Left pt a message letting him know that we will notify him about letter for jury duty once we hear from provider.

## 2023-01-16 NOTE — Telephone Encounter (Signed)
Patient checking on jury duty letter excuse. Patient phone number is [phone].

## 2023-01-19 ENCOUNTER — Telehealth (HOSPITAL_COMMUNITY): Payer: Self-pay | Admitting: Cardiology

## 2023-01-19 NOTE — Telephone Encounter (Signed)
Just an FYI. We have made several attempts to contact this patient including sending a letter to schedule or reschedule their Myoview. We will be removing the patient from the NUC/echo WQ.    patient states he will have to wait on stress test, see phone note 1/24 MAILED LETTER LBW 01/16/23 [facility] to schedule x 3 @ 9:58/LBW 01/14/23 [facility] to schedule x 2 @ 10:49/LBW 01/12/23 [facility] to schedule @ 11:07/LBW     Thank you

## 2023-01-22 ENCOUNTER — Ambulatory Visit: Payer: 59 | Admitting: Pulmonary Disease

## 2023-02-05 NOTE — Telephone Encounter (Signed)
Patient is returning phone call. Checking on jury duty letter excuse. Also would like results of PFT. Patient phone number is [phone].

## 2023-02-05 NOTE — Telephone Encounter (Signed)
Left message for patient to call back  

## 2023-02-10 ENCOUNTER — Telehealth: Payer: Self-pay

## 2023-02-10 NOTE — Telephone Encounter (Signed)
Icard, Bradley L, DO  You; [facility] Triage Pool58 minutes ago (9:00 AM)     Patients PFTs with severe obstructive lung disease. Ok to provide medical exemption for [employer] regarding the diagnosis of severe COPD.  Can you also ask if he uses daytime o2? Or nocturnal O2? If not he needs a nurse appt to be walked for o2 needs with a 6MWT and a ONO on room air to see if he needs o2 supplementation  Thanks  BLI  Garner Nash, DO [facility] 02/10/2023 8:59 AM         Typed up jury letter for pt as requested and reviewed PFT results as dictated by Dr. Valeta Harms. Notified pt letter is printed. Pt request that letter be placed in mail to home address when completed. Pt was also scheduled for 6 min walk and OV with Katie [name] on 02/23/23. Orders for  6 min walk and ONO were placed. Nothing further needed at this time.   Routing to [employer] as [employer]

## 2023-02-10 NOTE — Telephone Encounter (Signed)
[place] letter mailed to pt 2/202024.

## 2023-02-10 NOTE — Telephone Encounter (Signed)
Spoke with pt who is requesting excuse from jury duty r/t walk from parking lot being so far. Pt provided juror number [phone] and jury duty date is 02/26/23. Pt is requesting PFT results that were preformed on 01/15/23. Sending this message high priority as pt first called 5 weeks ago. Dr. Valeta Harms please advise.

## 2023-02-23 ENCOUNTER — Ambulatory Visit (INDEPENDENT_AMBULATORY_CARE_PROVIDER_SITE_OTHER): Payer: 59 | Admitting: Pulmonary Disease

## 2023-02-23 ENCOUNTER — Encounter: Payer: Self-pay | Admitting: Nurse Practitioner

## 2023-02-23 ENCOUNTER — Encounter: Payer: Self-pay | Admitting: Pulmonary Disease

## 2023-02-23 ENCOUNTER — Other Ambulatory Visit: Payer: Self-pay | Admitting: Nurse Practitioner

## 2023-02-23 ENCOUNTER — Ambulatory Visit (INDEPENDENT_AMBULATORY_CARE_PROVIDER_SITE_OTHER): Payer: 59 | Admitting: Nurse Practitioner

## 2023-02-23 VITALS — BP 126/88 | HR 105 | Ht 74.0 in | Wt 230.2 lb

## 2023-02-23 DIAGNOSIS — J849 Interstitial pulmonary disease, unspecified: Secondary | ICD-10-CM

## 2023-02-23 DIAGNOSIS — J449 Chronic obstructive pulmonary disease, unspecified: Secondary | ICD-10-CM

## 2023-02-23 DIAGNOSIS — J4550 Severe persistent asthma, uncomplicated: Secondary | ICD-10-CM

## 2023-02-23 MED ORDER — AIRSUPRA 90-80 MCG/ACT IN AERO: 2.0000 | INHALATION_SPRAY | RESPIRATORY_TRACT | 5 refills | Status: DC | PRN

## 2023-02-23 NOTE — Progress Notes (Signed)
Six Minute Walk - 02/23/23 1106       Six Minute Walk   Medications taken before test (dose and time) No medications were taken this morning.    Supplemental oxygen during test? No    Lap distance in meters  34 meters    Laps Completed 7    Partial lap (in meters) 32 meters    Baseline BP (sitting) 124/80    Baseline Heartrate 97    Baseline Dyspnea (Borg Scale) 4    Baseline Fatigue (Borg Scale) 0    Baseline SPO2 95 %      End of Test Values    BP (sitting) 130/88    Heartrate 115    Dyspnea (Borg Scale) 5    Fatigue (Borg Scale) 2    SPO2 94 %      2 Minutes Post Walk Values   BP (sitting) 126/88    Heartrate 105    SPO2 95 %    Stopped or paused before six minutes? No      Interpretation   Distance completed 270 meters    Tech Comments: Patient was able to complete the test at a slow, steady pace. He did state that he felt winded after walking for 2 minutes but wanted to complete the test. At the end of the test, he did have some wheezing but stated that he felt better once he sat down for a few seconds. No O2 needed during or after walk.

## 2023-02-23 NOTE — Assessment & Plan Note (Signed)
See above

## 2023-02-23 NOTE — Assessment & Plan Note (Signed)
Interstitial changes on imaging. Occupational history with chemical exposure. Plan for HRCT in June 2024 per Dr. Valeta Harms with plans to follow up in ILD clinic afterwards. He does have a restrictive defect on PFTs as well. Will repeat in 3 months for monitoring. He will bring ILD packet to his next visit.

## 2023-02-23 NOTE — Patient Instructions (Addendum)
Continue Breztri 2 puffs Twice daily. Brush tongue and rinse mouth afterwards. Make sure you are only using this twice a day. Continue Albuterol 3 mL neb every 6 hours as needed for shortness of breath or wheezing. Notify if symptoms persist despite rescue inhaler/neb use.   If you need to use something for breakthrough shortness of breath or wheezing, I have sent in an inhaler called Airsupra to use for rescue. This will take the place of your albuterol rescue inhaler. You can use 2 puffs every 4-6 hours as needed for shortness of breath or wheezing. Do not exceed 12 puffs in a day. Clean your mouth out after use  Someone will contact you regarding your overnight oxygen study results once turned in  You had a walk today without any drops in your oxygen  Complete ILD packet questionnaire Dr. Valeta Harms gave you  Attend high resolution CT chest in June.  Follow up with Dr. Chase Caller (new pt 30 min slot) week of June 10th and repeat spirometry/DLCO beforehand. If symptoms do not improve or worsen, please contact office for sooner follow up or seek emergency care.

## 2023-02-23 NOTE — Progress Notes (Signed)
$'@Patient'o$  ID: Mark Doyle, male    DOB: Jun 08, 1956, 67 y.o.   MRN: [id number]  Chief Complaint  Patient presents with   Follow-up    Pt f/u states that his breathing is okay when resting but gets SOB when moving    Referring provider: Marrian Salvage,*  HPI: 67 year old male, never smoker followed for severe obstructive lung disease with longstanding history of asthma.  He is a patient of Dr. Juline Patch and last seen in office 11/24/2022.  Past medical history significant for CAD, HLD, environmental allergies.  TEST/EVENTS:  01/07/2023 echo: EF 60-65%. LVH. GIDD. RV size and function nl. 01/15/2023 PFT: FVC 46, FEV1 40, ratio 67, TLC 75, DLCOcor 59.  No BD.  Mixed severe restriction and obstruction with diffusion defect 02/23/2023 6MWT: no desaturations on room air  11/24/2022: OV with Dr. Valeta Harms.  Follow-up regarding severe obstructive disease with longstanding history of asthma.  Has been using Breztri regularly.  Had a recent CT scan of the chest that showed some peripheral interstitial changes. 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  Possible underlying interstitial lung disease.  Will have him follow-up with Dr. Chase Caller in the ILD clinic.  Provided with ILD questionnaire.  Plan to repeat PFTs and obtain HRCT in 6 months.  02/23/2023: Today-follow-up Patient presents today for follow-up after 6-minute walk test, ordered by Dr. Valeta Harms.  He also had overnight oximetry, which she completed last night and returned this morning.  He did not have any desaturations on his 6MWT with SpO2 low of 94%.  ONO results have yet to be received.  He tells me today that he is feeling relatively the same as when he was here last.  Still has shortness of breath with exertion, including ADLs and walking on a flat surface at his own speed.  He has a daily, productive cough with clear sputum.  No increased chest congestion or wheezing.  He is using  his Breztri inhaler, usually 4 times a day.  Does not use his albuterol rescue inhaler much.  He does feel like he benefits from use of the [place].  No recent hospitalizations.  Has not required any steroids or antibiotics in the last 6 months.  No Known Allergies   There is no immunization history on file for this patient.  Past Medical History:  Diagnosis Date   Asthma    Environmental allergies     Tobacco History: Social History   Tobacco Use  Smoking Status Never  Smokeless Tobacco Current   Types: Chew   Ready to quit: Not Answered Counseling given: Not Answered   Outpatient Medications Prior to Visit  Medication Sig Dispense Refill   albuterol (PROVENTIL) (2.5 MG/3ML) 0.083% nebulizer solution Take 3 mLs (2.5 mg total) by nebulization every 6 (six) hours as needed for wheezing or shortness of breath. 120 mL 5   albuterol (VENTOLIN HFA) 108 (90 Base) MCG/ACT inhaler TAKE 2 PUFFS BY MOUTH EVERY 6 HOURS AS NEEDED FOR WHEEZE OR SHORTNESS OF BREATH 8.5 each 4   aspirin 81 MG chewable tablet Chew 81 mg by mouth daily.     atorvastatin (LIPITOR) 20 MG tablet Take 1 tablet (20 mg total) by mouth daily. 90 tablet 3   Budeson-Glycopyrrol-Formoterol (BREZTRI AEROSPHERE) 160-9-4.8 MCG/ACT AERO Inhale 2 puffs into the lungs in the morning and at bedtime. 10.7 g 11   Omega-3 Fatty Acids (FISH OIL PO) Take 1 tablet by mouth daily.  omeprazole (PRILOSEC) 20 MG capsule Take 1 capsule (20 mg total) by mouth daily. 90 capsule 3   Spacer/Aero-Holding Chambers (AEROCHAMBER MV) inhaler Use as instructed (Patient taking differently: 1 each by Other route as directed. Use as instructed) 1 each 0   triamcinolone cream (KENALOG) 0.1 % Apply 1 Application topically 2 (two) times daily. 30 g 0   VITAMIN D PO Take 1 tablet by mouth daily. Unknown strength     No facility-administered medications prior to visit.     Review of Systems:   Constitutional: No weight loss or gain, night sweats,  fevers, chills, or lassitude. +fatigue (baseline) HEENT: No headaches, difficulty swallowing, tooth/dental problems, or sore throat. No sneezing, itching, ear ache, nasal congestion, or post nasal drip CV:  No chest pain, orthopnea, PND, swelling in lower extremities, anasarca, dizziness, palpitations, syncope Resp: +shortness of breath with exertion; chronic productive cough. No hemoptysis. No wheezing.  No chest wall deformity GI:  No heartburn, indigestion, abdominal pain, nausea, vomiting, diarrhea, change in bowel habits, loss of appetite GU: No dysuria, change in color of urine, urgency or frequency.   Skin: No rash, lesions, ulcerations MSK:  No joint pain or swelling.  Neuro: No dizziness or lightheadedness.  Psych: No depression or anxiety. Mood stable.     Physical Exam:  BP 126/88   Pulse (!) 105   Ht '6\' 2"'$  (1.88 m)   Wt 230 lb 3.2 oz (104.4 kg)   SpO2 95%   BMI 29.56 kg/m   GEN: Pleasant, interactive, well-appearing; in no acute distress. HEENT:  Normocephalic and atraumatic. PERRLA. Sclera white. Nasal turbinates pink, moist and patent bilaterally. No rhinorrhea present. Oropharynx pink and moist, without exudate or edema. No lesions, ulcerations, or postnasal drip.  NECK:  Supple w/ fair ROM. No JVD present. Normal carotid impulses w/o bruits. Thyroid symmetrical with no goiter or nodules palpated. No lymphadenopathy.   CV: RRR, no m/r/g, no peripheral edema. Pulses intact, +2 bilaterally. No cyanosis, pallor or clubbing. PULMONARY:  Unlabored, regular breathing. Clear bilaterally A&P w/o wheezes/rales/rhonchi. No accessory muscle use.  GI: BS present and normoactive. Soft, non-tender to palpation. No organomegaly or masses detected.  MSK: No erythema, warmth or tenderness. Cap refil <2 sec all extrem. No deformities or joint swelling noted.  Neuro: A/Ox3. No focal deficits noted.   Skin: Warm, no lesions or rashe Psych: Normal affect and behavior. Judgement and thought  content appropriate.     Lab Results:  CBC    Component Value Date/Time   WBC 8.3 10/17/2022 0956   RBC 4.57 10/17/2022 0956   HGB 15.7 10/17/2022 0956   HCT 45.9 10/17/2022 0956   PLT 276.0 10/17/2022 0956   MCV 100.5 (H) 10/17/2022 0956   MCHC 34.1 10/17/2022 0956   RDW 13.2 10/17/2022 0956   LYMPHSABS 1.6 10/17/2022 0956   MONOABS 0.9 10/17/2022 0956   EOSABS 0.4 10/17/2022 0956   BASOSABS 0.1 10/17/2022 0956    BMET    Component Value Date/Time   NA 136 10/17/2022 0956   K 3.9 10/17/2022 0956   CL 99 10/17/2022 0956   CO2 26 10/17/2022 0956   GLUCOSE 105 (H) 10/17/2022 0956   BUN 5 (L) 10/17/2022 0956   CREATININE 0.78 10/17/2022 0956   CALCIUM 9.2 10/17/2022 0956    BNP No results found for: "BNP"   Imaging:  No results found.       Latest Ref Rng & Units 01/15/2023   10:42 AM 10/02/2021    9:14  AM  PFT Results  FVC-Pre L 2.38  2.10   FVC-Predicted Pre % 46  40   FVC-Post L 2.35  2.28   FVC-Predicted Post % 46  44   Pre FEV1/FVC % % 65  58   Post FEV1/FCV % % 67  60   FEV1-Pre L 1.54  1.22   FEV1-Predicted Pre % 40  31   FEV1-Post L 1.58  1.38   DLCO uncorrected ml/min/mmHg 17.32  17.43   DLCO UNC% % 59  59   DLCO corrected ml/min/mmHg 17.27  17.43   DLCO COR %Predicted % 59  59   DLVA Predicted % 93  97   TLC L 5.72  5.84   TLC % Predicted % 75  76   RV % Predicted % 111  132     No results found for: "NITRICOXIDE"      Assessment & Plan:   Asthma Severe obstructive lung disease due to longstanding asthma history. Stable without recent exacerbations or hospitalizations. Overuse of Breztri. Educated on appropriate frequency and potential side effects of overuse. Verbalized understanding. We will send in ICS/SABA with Airsupra for him to use in place of albuterol for rescue. Seems to get best benefit from ICS. 6MWT today without desaturations. Awaiting ONO results. Action plan in place.  Patient Instructions  Continue Breztri 2  puffs Twice daily. Brush tongue and rinse mouth afterwards. Make sure you are only using this twice a day. Continue Albuterol 3 mL neb every 6 hours as needed for shortness of breath or wheezing. Notify if symptoms persist despite rescue inhaler/neb use.   If you need to use something for breakthrough shortness of breath or wheezing, I have sent in an inhaler called Airsupra to use for rescue. This will take the place of your albuterol rescue inhaler. You can use 2 puffs every 4-6 hours as needed for shortness of breath or wheezing. Do not exceed 12 puffs in a day. Clean your mouth out after use  Someone will contact you regarding your overnight oxygen study results once turned in  You had a walk today without any drops in your oxygen  Complete ILD packet questionnaire Dr. Valeta Harms gave you  Attend high resolution CT chest in June.  Follow up with Dr. Chase Caller (new pt 30 min slot) week of June 10th and repeat spirometry/DLCO beforehand. If symptoms do not improve or worsen, please contact office for sooner follow up or seek emergency care.    Obstructive lung disease ([place]) See above  ILD (interstitial lung disease) ([place]) Interstitial changes on imaging. Occupational history with chemical exposure. Plan for HRCT in June 2024 per Dr. Valeta Harms with plans to follow up in ILD clinic afterwards. He does have a restrictive defect on PFTs as well. Will repeat in 3 months for monitoring. He will bring ILD packet to his next visit.    I spent 32 minutes of dedicated to the care of this patient on the date of this encounter to include pre-visit review of records, face-to-face time with the patient discussing conditions above, post visit ordering of testing, clinical documentation with the electronic health record, making appropriate referrals as documented, and communicating necessary findings to members of the patients care team.  Clayton Bibles, NP 02/23/2023  Pt aware and understands NP's role.

## 2023-02-23 NOTE — Assessment & Plan Note (Addendum)
Severe obstructive lung disease due to longstanding asthma history. Stable without recent exacerbations or hospitalizations. Overuse of Breztri. Educated on appropriate frequency and potential side effects of overuse. Verbalized understanding. We will send in ICS/SABA with Airsupra for him to use in place of albuterol for rescue. Seems to get best benefit from ICS. 6MWT today without desaturations. Awaiting ONO results. Action plan in place.  Patient Instructions  Continue Breztri 2 puffs Twice daily. Brush tongue and rinse mouth afterwards. Make sure you are only using this twice a day. Continue Albuterol 3 mL neb every 6 hours as needed for shortness of breath or wheezing. Notify if symptoms persist despite rescue inhaler/neb use.   If you need to use something for breakthrough shortness of breath or wheezing, I have sent in an inhaler called Airsupra to use for rescue. This will take the place of your albuterol rescue inhaler. You can use 2 puffs every 4-6 hours as needed for shortness of breath or wheezing. Do not exceed 12 puffs in a day. Clean your mouth out after use  Someone will contact you regarding your overnight oxygen study results once turned in  You had a walk today without any drops in your oxygen  Complete ILD packet questionnaire Dr. Valeta Harms gave you  Attend high resolution CT chest in June.  Follow up with Dr. Chase Caller (new pt 30 min slot) week of June 10th and repeat spirometry/DLCO beforehand. If symptoms do not improve or worsen, please contact office for sooner follow up or seek emergency care.

## 2023-02-24 ENCOUNTER — Other Ambulatory Visit (HOSPITAL_COMMUNITY): Payer: Self-pay

## 2023-02-24 NOTE — Telephone Encounter (Signed)
Please run test claim for ICS inhalers. Thanks.

## 2023-02-24 NOTE — Telephone Encounter (Signed)
Per test claim (see below) Proair HFA, Ventolin HFA, Levalbuterol HFA, or Symbicort are preferred (please note some preferred products might still require PA). No PA submitted at this time. Please change if appropriate or advise to continue with PA.

## 2023-02-25 NOTE — Telephone Encounter (Signed)
Mark Doyle, can we call the pharmacy and ask them if they can try the coupon card again? I've had some have success with this. If not, he needs to contact his insurance to determine what inhaled steroid inhalers are covered (ICS only). Thanks.

## 2023-02-26 ENCOUNTER — Telehealth: Payer: Self-pay | Admitting: Nurse Practitioner

## 2023-02-26 NOTE — Telephone Encounter (Signed)
PT just saw Ms. [name] who gave him a script for an rescue inhaler and mentioned that if it is not covered to call us and she will RX another brand/generic.  [facility] is: [id number] [place], [place] [address]   [facility] told him they can not get thru to [employer] phone and they have coupons for the brand name but due to the pharmacy cyber attack they can not access them.  Pls all PT to advise @ [phone]

## 2023-02-27 NOTE — Telephone Encounter (Signed)
PT calling again upset no call back. Please call @ [phone]

## 2023-02-27 NOTE — Telephone Encounter (Signed)
Pt is r

## 2023-02-27 NOTE — Telephone Encounter (Signed)
Called and spoke with patient. He stated that his insurance will not cover Airsupra. [facility] is not able to process the coupon card they have due to a cyberattack on the coupon's system ([place]). He wants to know if another inhaler can be sent in for him.   [facility] [address] [facility]   Joellen Jersey, can you please advise? Thanks!

## 2023-03-02 ENCOUNTER — Other Ambulatory Visit (HOSPITAL_COMMUNITY): Payer: Self-pay

## 2023-03-02 NOTE — Telephone Encounter (Signed)
Per benefits investigation Qvar and Arnuity are the preferred ICS under the patients plan at this time.

## 2023-03-02 NOTE — Telephone Encounter (Signed)
Dr. Valeta Harms can you please advise on inhaler selection? Thank you

## 2023-03-02 NOTE — Telephone Encounter (Signed)
PT calling about these medications. He states [facility] is going [facility] be [facility] us to get one or the other filled.  He only has his Albuterol right now.   He has called several times. His  is [phone]  [facility].

## 2023-03-02 NOTE — Telephone Encounter (Signed)
ATC LVMTCB x 1  

## 2023-03-02 NOTE — Telephone Encounter (Signed)
Please run test claim for ICS inhalers (Qvar, Pulmicort, Arnuity, etc...). Thanks

## 2023-03-03 MED ORDER — ARNUITY ELLIPTA 100 MCG/ACT IN AEPB: 1.0000 | INHALATION_SPRAY | Freq: Every day | RESPIRATORY_TRACT | 5 refills | Status: DC

## 2023-03-03 NOTE — Telephone Encounter (Signed)
Patient is returning phone call. Patient phone number is [phone].

## 2023-03-03 NOTE — Telephone Encounter (Signed)
Icard, Octavio Graves, DO  You; Lbpu Triage Pool16 hours ago (4:46 PM)    [place] for arnuity BLI  Pt notified of Arnuity being called in and stated understanding. Nothing further needed at this time.

## 2023-03-03 NOTE — Telephone Encounter (Signed)
ATC LVMTCB x 1  

## 2023-03-09 DIAGNOSIS — H5213 Myopia, bilateral: Secondary | ICD-10-CM | POA: Insufficient documentation

## 2023-03-09 DIAGNOSIS — H25013 Cortical age-related cataract, bilateral: Secondary | ICD-10-CM | POA: Insufficient documentation

## 2023-03-09 DIAGNOSIS — H353131 Nonexudative age-related macular degeneration, bilateral, early dry stage: Secondary | ICD-10-CM | POA: Insufficient documentation

## 2023-03-09 DIAGNOSIS — H2513 Age-related nuclear cataract, bilateral: Secondary | ICD-10-CM | POA: Insufficient documentation

## 2023-03-24 ENCOUNTER — Other Ambulatory Visit: Payer: Self-pay | Admitting: Pulmonary Disease

## 2023-04-15 ENCOUNTER — Other Ambulatory Visit: Payer: Self-pay | Admitting: Pulmonary Disease

## 2023-04-27 ENCOUNTER — Telehealth: Payer: Self-pay | Admitting: Family

## 2023-04-27 NOTE — Telephone Encounter (Signed)
Prescription Request  04/27/2023  Is this a "Controlled Substance" medicine? No  LOV: Visit date not found  What is the name of the medication or equipment?  triamcinolone cream (KENALOG) 0.1 %   Have you contacted your pharmacy to request a refill? No   Which pharmacy would you like this sent to?   [id number] [place], [place] - [address] [place] [address] Phone: [phone] Fax: [phone]   Patient notified that their request is being sent to the clinical staff for review and that they should receive a response within 2 business days.   Please advise at [facility] [phone]

## 2023-04-27 NOTE — Telephone Encounter (Signed)
Is it okay for me to go ahead and send this in? Looks like you've ordered it before back in 09/2022

## 2023-04-28 ENCOUNTER — Other Ambulatory Visit: Payer: Self-pay

## 2023-04-28 ENCOUNTER — Other Ambulatory Visit: Payer: Self-pay | Admitting: Family

## 2023-04-28 MED ORDER — TRIAMCINOLONE ACETONIDE 0.1 % EX CREA: 1.0000 | TOPICAL_CREAM | Freq: Two times a day (BID) | CUTANEOUS | 0 refills | Status: DC

## 2023-04-28 NOTE — Telephone Encounter (Signed)
Called pt to inform him Rx has been sent to correct pharmacy pt expressed understanding.

## 2023-04-28 NOTE — Telephone Encounter (Signed)
Patient states he no longer uses the pharmacy at [facility]. Please send medication to:  [id number] [place], [place] - [address] [facility] [address], [place] [address] Phone: [phone]  Fax: [phone]

## 2023-05-26 ENCOUNTER — Ambulatory Visit (HOSPITAL_BASED_OUTPATIENT_CLINIC_OR_DEPARTMENT_OTHER): Admission: RE | Admit: 2023-05-26 | Payer: 59 | Source: Ambulatory Visit

## 2023-05-28 ENCOUNTER — Ambulatory Visit (HOSPITAL_BASED_OUTPATIENT_CLINIC_OR_DEPARTMENT_OTHER)
Admission: RE | Admit: 2023-05-28 | Discharge: 2023-05-28 | Disposition: A | Discharge status: Home / Self Care | Payer: 59 | Source: Ambulatory Visit | Attending: Pulmonary Disease | Admitting: Pulmonary Disease

## 2023-05-28 DIAGNOSIS — J4550 Severe persistent asthma, uncomplicated: Secondary | ICD-10-CM | POA: Insufficient documentation

## 2023-05-28 DIAGNOSIS — J449 Chronic obstructive pulmonary disease, unspecified: Secondary | ICD-10-CM | POA: Insufficient documentation

## 2023-05-28 DIAGNOSIS — R918 Other nonspecific abnormal finding of lung field: Secondary | ICD-10-CM | POA: Diagnosis not present

## 2023-05-28 DIAGNOSIS — J849 Interstitial pulmonary disease, unspecified: Secondary | ICD-10-CM | POA: Diagnosis not present

## 2023-06-01 ENCOUNTER — Other Ambulatory Visit: Payer: Self-pay

## 2023-06-01 DIAGNOSIS — J849 Interstitial pulmonary disease, unspecified: Secondary | ICD-10-CM

## 2023-06-01 DIAGNOSIS — R0609 Other forms of dyspnea: Secondary | ICD-10-CM

## 2023-06-02 ENCOUNTER — Ambulatory Visit (INDEPENDENT_AMBULATORY_CARE_PROVIDER_SITE_OTHER): Payer: 59 | Admitting: Internal Medicine

## 2023-06-02 DIAGNOSIS — R0609 Other forms of dyspnea: Secondary | ICD-10-CM | POA: Diagnosis not present

## 2023-06-02 DIAGNOSIS — J849 Interstitial pulmonary disease, unspecified: Secondary | ICD-10-CM

## 2023-06-02 LAB — PULMONARY FUNCTION TEST
DL/VA % pred: 99 %
DL/VA: 4.01 ml/min/mmHg/L
DLCO cor % pred: 72 %
DLCO cor: 21.73 ml/min/mmHg
DLCO unc % pred: 72 %
DLCO unc: 21.73 ml/min/mmHg
FEF 25-75 Pre: 1.35 L/sec
FEF2575-%Pred-Pre: 44 %
FEV1-%Pred-Pre: 51 %
FEV1-Pre: 2.02 L
FEV1FVC-%Pred-Pre: 92 %
FEV6-%Pred-Pre: 58 %
FEV6-Pre: 2.94 L
FEV6FVC-%Pred-Pre: 104 %
FVC-%Pred-Pre: 55 %
FVC-Pre: 2.94 L
Pre FEV1/FVC ratio: 69 %
Pre FEV6/FVC Ratio: 100 %

## 2023-06-02 NOTE — Patient Instructions (Signed)
Spiro/DLCO performed today.  

## 2023-06-02 NOTE — Progress Notes (Signed)
Spiro/DLCO performed today.  

## 2023-06-03 NOTE — Progress Notes (Signed)
Mark Doyle, please enurse follow up in ild clinic with Dr. Marchelle Gearing   Thanks,  BLI  Mark Igo, DO [facility] 06/03/2023 12:02 PM

## 2023-06-09 ENCOUNTER — Other Ambulatory Visit: Payer: Self-pay | Admitting: Family

## 2023-06-23 ENCOUNTER — Ambulatory Visit (INDEPENDENT_AMBULATORY_CARE_PROVIDER_SITE_OTHER): Payer: 59 | Admitting: Internal Medicine

## 2023-06-23 ENCOUNTER — Encounter: Payer: Self-pay | Admitting: Internal Medicine

## 2023-06-23 VITALS — BP 124/84 | HR 78 | Ht 74.0 in | Wt 240.0 lb

## 2023-06-23 DIAGNOSIS — J849 Interstitial pulmonary disease, unspecified: Secondary | ICD-10-CM

## 2023-06-23 DIAGNOSIS — R053 Chronic cough: Secondary | ICD-10-CM | POA: Diagnosis not present

## 2023-06-23 DIAGNOSIS — R0609 Other forms of dyspnea: Secondary | ICD-10-CM | POA: Diagnosis not present

## 2023-06-23 LAB — CBC WITH DIFFERENTIAL/PLATELET
Basophils Absolute: 0.1 10*3/uL (ref 0.0–0.1)
Basophils Relative: 1.5 % (ref 0.0–3.0)
Eosinophils Absolute: 0.3 10*3/uL (ref 0.0–0.7)
Eosinophils Relative: 3.8 % (ref 0.0–5.0)
HCT: 43 % (ref 39.0–52.0)
Hemoglobin: 15 g/dL (ref 13.0–17.0)
Lymphocytes Relative: 12 % (ref 12.0–46.0)
Lymphs Abs: 1 10*3/uL (ref 0.7–4.0)
MCHC: 34.9 g/dL (ref 30.0–36.0)
MCV: 97.1 fl (ref 78.0–100.0)
Monocytes Absolute: 0.8 10*3/uL (ref 0.1–1.0)
Monocytes Relative: 10.1 % (ref 3.0–12.0)
Neutro Abs: 5.9 10*3/uL (ref 1.4–7.7)
Neutrophils Relative %: 72.6 % (ref 43.0–77.0)
Platelets: 308 10*3/uL (ref 150.0–400.0)
RBC: 4.43 Mil/uL (ref 4.22–5.81)
RDW: 13.3 % (ref 11.5–15.5)
WBC: 8.1 10*3/uL (ref 4.0–10.5)

## 2023-06-23 LAB — BASIC METABOLIC PANEL
BUN: 8 mg/dL (ref 6–23)
CO2: 23 mEq/L (ref 19–32)
Calcium: 9.2 mg/dL (ref 8.4–10.5)
Chloride: 101 mEq/L (ref 96–112)
Creatinine, Ser: 0.76 mg/dL (ref 0.40–1.50)
GFR: 93.42 mL/min (ref >60.00)
Glucose, Bld: 109 mg/dL — ABNORMAL HIGH (ref 70–99)
Potassium: 3.5 mEq/L (ref 3.5–5.1)
Sodium: 133 mEq/L — ABNORMAL LOW (ref 135–145)

## 2023-06-23 LAB — HEPATIC FUNCTION PANEL
ALT: 21 U/L (ref 0–53)
AST: 18 U/L (ref 0–37)
Albumin: 3.9 g/dL (ref 3.5–5.2)
Alkaline Phosphatase: 89 U/L (ref 39–117)
Bilirubin, Direct: 0.3 mg/dL (ref 0.0–0.3)
Total Bilirubin: 1 mg/dL (ref 0.2–1.2)
Total Protein: 7.5 g/dL (ref 6.0–8.3)

## 2023-06-23 LAB — CK: Total CK: 92 U/L (ref 7–232)

## 2023-06-23 LAB — SEDIMENTATION RATE: Sed Rate: 28 mm/hr — ABNORMAL HIGH (ref 0–20)

## 2023-06-23 NOTE — Progress Notes (Unsigned)
PMH asthma, seasonal allergies, HTN, never smoker. He is SOB when he walks. He uses his sister nebulizer and OTC primatime mist.  Here today to establish care with new primary pulmonologist.  He does not have a primary care doctor.  He does have ongoing symptoms of dyspnea on exertion.  Nocturnal cough.  He has had asthma for a long time.  Still using over-the-counter Primatene Mist.  And using his sister's nebulizer machine on occasion.  He does not have any maintenance inhalers or no albuterol to use at home.  He does have seasonal allergies.  No other significant past medical history.  Please see below.  Patient had a coughing episode a few weeks ago developed right-sided abdominal and flank pain.  He feels like he pulled a muscle its been sore.  He has been using a heating pad regularly.  OV 10/02/2021: Here today for follow-up after pulmonary function tests.  He has a new FEV1 of only 36% predicted with significant bronchodilator response, significant air trapping.  The non-smoker with a longstanding history of asthma.  Is like he has had significant airway remodeling.  He has not been using a maintenance inhaler.  He did not like using Advair or Breo.  It caused him to have significant sore throat.  So he stopped using it.  Only using albuterol at this time.  Presents today to the office with ongoing cough chest pain shortness of breath.  He feels very raspy at times and still having ongoing wheezing which is worse at nighttime.  OV 04/07/2022: Here today for follow-up regarding severe obstructive disease.  Presumed related to longstanding history of asthma with airway remodeling.  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  However he has not had any exacerbations in the past year.  Doing well with his Breztri inhaler with spacer.  He has also gotten rid of his sore throat that he had associated with use of his previous  inhalers.  He does much better on MDI than having a dry powder.  He is able to complete his activities of daily living but he does have some shortness of breath still with exertion.  OV 11/24/2022: Here today for follow-up regarding severe obstructive disease longstanding history of asthma presumed related to airway remodeling.  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  Has been using his inhalers regularly.  Had a recent CT scan of the chest that shows some peripheral interstitial changes.     02/23/2023: Today-follow-up Patient presents today for follow-up after 6-minute walk test, ordered by Dr. Tonia Brooms.  He also had overnight oximetry, which she completed last night and returned this morning.  He did not have any desaturations on his with SpO2 low of 94%.  ONO results have yet to be received.  He tells me today that he is feeling relatively the same as when he was here last.  Still has shortness of breath with exertion, including ADLs and walking on a flat surface at his own speed.  He has a daily, productive cough with clear sputum.  No increased chest congestion or wheezing.  He is using his Breztri inhaler, usually 4 times a day.  Does not use his albuterol rescue inhaler much.  He does feel like he benefits from use of the [place].  No recent hospitalizations.  Has not required any steroids or antibiotics in the last  6 months.  OV 06/23/2023  Subjective:  Patient ID: Mark Doyle, male , DOB: December 13, 1956 , age 67 y.o. , MRN: [id number] , ADDRESS: [address] [place] [address] PCP Olive Bass, FNP Patient Care Team: Olive Bass, FNP as PCP - General (Internal Medicine)  This Provider for this visit: Treatment Team:  Attending Provider: Kalman Shan, MD    06/23/2023 -   Chief Complaint  Patient presents with   Follow-up    Breathing has no change  SOB  ACT 10     HPI Mark Doyle  67 y.o. -    Simple office walk 224 (66+46 x 2) feet Pod A at [facility] x  3 laps goal with forehead probe 06/23/2023    O2 used ra   Number laps completed Sit stand  x 10   Comments about pace good   Resting Pulse Ox/HR 96% and 77/min   Final Pulse Ox/HR 96% and 91/min   Desaturated </= 88% no   Desaturated <= 3% points no   Got Tachycardic >/= 90/min yes   Symptoms at end of test Mild dyspnea   Miscellaneous comments x      HRCT Chest data 05/28/23 - personally visualized and independently interpreted and my findings are: ***    Narrative & Impression  CLINICAL DATA:  67 year old male with history of chronic cough and shortness of breath. Evaluate for interstitial lung disease.   EXAM: CT CHEST WITHOUT CONTRAST   TECHNIQUE: Multidetector CT imaging of the chest was performed following the standard protocol without intravenous contrast. High resolution imaging of the lungs, as well as inspiratory and expiratory imaging, was performed.   RADIATION DOSE REDUCTION: This exam was performed according to the departmental dose-optimization program which includes automated exposure control, adjustment of the mA and/or kV according to patient size and/or use of iterative reconstruction technique.   COMPARISON:  Chest CT 10/25/2022.   FINDINGS: Cardiovascular: Heart size is normal. There is no significant pericardial fluid, thickening or pericardial calcification. There is aortic atherosclerosis, as well as atherosclerosis of the great vessels of the mediastinum and the coronary arteries, including calcified atherosclerotic plaque in the left main, left anterior descending, left circumflex and right coronary arteries.   Mediastinum/Nodes: No pathologically enlarged mediastinal or hilar lymph nodes. Please note that accurate exclusion of hilar adenopathy is limited on noncontrast CT scans. Esophagus is unremarkable in appearance. No axillary lymphadenopathy.   Lungs/Pleura:  High-resolution images again demonstrate widespread areas of mild ground-glass attenuation, septal thickening, subpleural reticulation, thickening of the peribronchovascular interstitium and regional architectural distortion. These findings are most evident in the mid to lower lungs bilaterally. No definite traction bronchiectasis or honeycombing. No substantial progression of disease compared to the prior examination. Inspiratory and expiratory imaging demonstrates some mild air trapping indicative of mild small airways disease. There is also partial collapse of the trachea and mainstem bronchi, indicative of tracheobronchomalacia. No acute consolidative airspace disease. No pleural effusions. No definite suspicious appearing pulmonary nodules or masses are noted.   Upper Abdomen: Diffuse low attenuation throughout the visualized hepatic parenchyma, indicative of a background of hepatic steatosis. Status post cholecystectomy.   Musculoskeletal: There are no aggressive appearing lytic or blastic lesions noted in the visualized portions of the skeleton.   IMPRESSION: 1. The appearance of the lungs is stable compared to the prior study, once again categorized as indeterminate for usual interstitial pneumonia (UIP) per current ATS guidelines. Repeat high-resolution chest CT should be considered in 12 months  to assess for temporal changes in the appearance of the lung parenchyma. 2. Mild air trapping indicative of small airways disease. 3. Mild tracheobronchomalacia. 4. Hepatic steatosis. 5. Aortic atherosclerosis, in addition to left main and three-vessel coronary artery disease. Please note that although the presence of coronary artery calcium documents the presence of coronary artery disease, the severity of this disease and any potential stenosis cannot be assessed on this non-gated CT examination. Assessment for potential risk factor modification, dietary therapy or  pharmacologic therapy may be warranted, if clinically indicated.   Aortic Atherosclerosis (ICD10-I70.0).     Electronically Signed   By: Trudie Reed M.D.   On: 06/03/2023 09:56    Latest Reference Range & Units 07/09/21 11:21 10/17/22 09:56  Creatinine 0.40 - 1.50 mg/dL 4.09 8.11    Latest Reference Range & Units 07/09/21 11:21 10/17/22 09:56  Hemoglobin 13.0 - 17.0 g/dL 91.4 78.2    Latest Reference Range & Units 07/09/21 11:21 10/17/22 09:56  Eosinophils Absolute 0.0 - 0.7 K/uL 0.5 0.4   PFT     Latest Ref Rng & Units 06/01/2023    2:42 PM 01/15/2023   10:42 AM 10/02/2021    9:14 AM  PFT Results  FVC-Pre L 2.94  2.38  2.10   FVC-Predicted Pre % 55  46  40   FVC-Post L  2.35  2.28   FVC-Predicted Post %  46  44   Pre FEV1/FVC % % 69  65  58   Post FEV1/FCV % %  67  60   FEV1-Pre L 2.02  1.54  1.22   FEV1-Predicted Pre % 51  40  31   FEV1-Post L  1.58  1.38   DLCO uncorrected ml/min/mmHg 21.73  17.32  17.43   DLCO UNC% % 72  59  59   DLCO corrected ml/min/mmHg 21.73  17.27  17.43   DLCO COR %Predicted % 72  59  59   DLVA Predicted % 99  93  97   TLC L  5.72  5.84   TLC % Predicted %  75  76   RV % Predicted %  111  132     MPRESSIONS  ECHO JAn 2024    1. Left ventricular ejection fraction, by estimation, is 60 to 65%. The  left ventricle has normal function. The left ventricle has no regional  wall motion abnormalities. There is moderate left ventricular hypertrophy.  Left ventricular diastolic  parameters are consistent with Grade I diastolic dysfunction (impaired  relaxation).   2. Right ventricular systolic function is normal. The right ventricular  size is normal.   3. The mitral valve is normal in structure. No evidence of mitral valve  regurgitation. No evidence of mitral stenosis.   4. The aortic valve is normal in structure. Aortic valve regurgitation is  not visualized. No aortic stenosis is present.   5. Aneurysm of the ascending aorta,  measuring 43 mm.   6. The inferior vena cava is normal in size with greater than 50%  respiratory variability, suggesting right atrial pressure of 3 mmHg.   Comparison(s): EF 55%, asc aor 41 mm.     has a past medical history of Asthma and Environmental allergies.   reports that he has never smoked. His smokeless tobacco use includes chew.  Past Surgical History:  Procedure Laterality Date   CHOLECYSTECTOMY     SPINE SURGERY     TONSILLECTOMY      No Known Allergies   There is  no immunization history on file for this patient.  Family History  Problem Relation Age of Onset   Asthma Mother    Heart disease Father    Stroke Father      Current Outpatient Medications:    albuterol (PROVENTIL) (2.5 MG/3ML) 0.083% nebulizer solution, Take 3 mLs (2.5 mg total) by nebulization every 6 (six) hours as needed for wheezing or shortness of breath., Disp: 120 mL, Rfl: 5   albuterol (VENTOLIN HFA) 108 (90 Base) MCG/ACT inhaler, TAKE 2 PUFFS BY MOUTH EVERY 6 HOURS AS NEEDED FOR WHEEZE OR SHORTNESS OF BREATH, Disp: 18 each, Rfl: 2   Albuterol-Budesonide (AIRSUPRA) 90-80 MCG/ACT AERO, Inhale 2 puffs into the lungs every 4 (four) hours as needed (shortness of breath or wheezing). Not to exceed 12 puffs in 24 hours, Disp: 10.7 g, Rfl: 5   aspirin 81 MG chewable tablet, Chew 81 mg by mouth daily., Disp: , Rfl:    atorvastatin (LIPITOR) 20 MG tablet, Take 1 tablet (20 mg total) by mouth daily., Disp: 90 tablet, Rfl: 3   Budeson-Glycopyrrol-Formoterol (BREZTRI AEROSPHERE) 160-9-4.8 MCG/ACT AERO, Inhale 2 puffs into the lungs in the morning and at bedtime., Disp: 10.7 g, Rfl: 11   Fluticasone Furoate (ARNUITY ELLIPTA) 100 MCG/ACT AEPB, Inhale 1 puff into the lungs daily., Disp: 30 each, Rfl: 5   Omega-3 Fatty Acids (FISH OIL PO), Take 1 tablet by mouth daily., Disp: , Rfl:    omeprazole (PRILOSEC) 20 MG capsule, Take 1 capsule (20 mg total) by mouth daily., Disp: 90 capsule, Rfl: 3    Spacer/Aero-Holding Chambers (AEROCHAMBER MV) inhaler, Use as instructed (Patient taking differently: 1 each by Other route as directed. Use as instructed), Disp: 1 each, Rfl: 0   triamcinolone cream (KENALOG) 0.1 %, APPLY TO AFFECTED AREA TWICE A DAY, Disp: 30 g, Rfl: 0   VITAMIN D PO, Take 1 tablet by mouth daily. Unknown strength, Disp: , Rfl:       Objective:   Vitals:   06/23/23 1103  BP: 124/84  Pulse: 78  SpO2: 96%  Weight: 240 lb (108.9 kg)  Height: 6\' 2"  (1.88 m)    Estimated body mass index is 30.81 kg/m as calculated from the following:   Height as of this encounter: 6\' 2"  (1.88 m).   Weight as of this encounter: 240 lb (108.9 kg).  @WEIGHTCHANGE @  [employer]   06/23/23 1103  Weight: 240 lb (108.9 kg)     Physical Exam   General: No distress. *** O2 at rest: *** Cane present: *** Sitting in wheel chair: *** Frail: *** Obese: *** Neuro: Alert and Oriented x 3. GCS 15. Speech normal Psych: Pleasant Resp:  Barrel Chest - ***.  Wheeze - ***, Crackles - ***, No overt respiratory distress CVS: Normal heart sounds. Murmurs - *** Ext: Stigmata of Connective Tissue Disease - *** HEENT: Normal upper airway. PEERL +. No post nasal drip        Assessment:       ICD-10-CM   1. ILD (interstitial lung disease) (HCC)  J84.9     2. DOE (dyspnea on exertion)  R06.09          Plan:     Patient Instructions     ICD-10-CM   1. ILD (interstitial lung disease) (HCC)  J84.9     2. DOE (dyspnea on exertion)  R06.09         - I am concerned you  have Interstitial Lung Disease (ILD)  -  There  are MANY varieties of this - To narrow down possibilities and assess severity please do the following tests  -  do walking test on room air in the office (not 6 min walk test)  - do overnight oxygen test on room air  - - do autoimmune panel: Serum: ESR, ANA, DS-DNA, RF, anti-CCP, ssA, ssB, scl-70, ANCA, MPO and PR-3 antibodies, Total CK,  Aldolase,  Hypersensitivity  Pneumonitis Panel   - do Quantiferon Gold  - Do RAST allrgy panel    SIGNATURE    Dr. Kalman Shan, M.D., F.C.C.P,  Pulmonary and Critical Care Medicine Staff Physician, [facility] Center Director - Interstitial Lung Disease  Program  Pulmonary Fibrosis [facility] [place], [place], [address]  Pager: [phone], If no answer or between  15:00h - 7:00h: call [phone] Telephone: [phone]  11:32 AM 06/23/2023   Moderate Complexity MDM OFFICE  2021 E/M guidelines, first released in 2021, with minor revisions added in 2023 and 2024 Must meet the requirements for 2 out of 3 dimensions to qualify.    Number and complexity of problems addressed Amount and/or complexity of data reviewed Risk of complications and/or morbidity  One or more chronic illness with mild exacerbation, OR progression, OR  side effects of treatment  Two or more stable chronic illnesses  One undiagnosed new problem with uncertain prognosis  One acute illness with systemic symptoms   One Acute complicated injury Must meet the requirements for 1 of 3 of the categories)  Category 1: Tests and documents, historian  Any combination of 3 of the following:  Assessment requiring an independent historian  Review of prior external note(s) from each unique source  Review of results of each unique test  Ordering of each unique test    Category 2: Interpretation of tests   Independent interpretation of a test performed by another physician/other qualified health care professional (not separately reported)  Category 3: Discuss management/tests  Discussion of management or test interpretation with external physician/other qualified health care professional/appropriate source (not separately reported) Moderate risk of morbidity from additional diagnostic testing or treatment Examples only:  Prescription drug management  Decision regarding  minor surgery with identfied patient or procedure risk factors  Decision regarding elective major surgery without identified patient or procedure risk factors  Diagnosis or treatment significantly limited by social determinants of health             HIGh Complexity  OFFICE   2021 E/M guidelines, first released in 2021, with minor revisions added in 2023. Must meet the requirements for 2 out of 3 dimensions to qualify.    Number and complexity of problems addressed Amount and/or complexity of data reviewed Risk of complications and/or morbidity  Severe exacerbation of chronic illness  Acute or chronic illnesses that may pose a threat to life or bodily function, e.g., multiple trauma, acute MI, pulmonary embolus, severe respiratory distress, progressive rheumatoid arthritis, psychiatric illness with potential threat to self or others, peritonitis, acute renal failure, abrupt change in neurological status Must meet the requirements for 2 of 3 of the categories)  Category 1: Tests and documents, historian  Any combination of 3 of the following:  Assessment requiring an independent historian  Review of prior external note(s) from each unique source  Review of results of each unique test  Ordering of each unique test    Category 2: Interpretation of tests    Independent interpretation of  a test performed by another physician/other qualified health care professional (not separately reported)  Category 3: Discuss management/tests  Discussion of management or test interpretation with external physician/other qualified health care professional/appropriate source (not separately reported)  HIGH risk of morbidity from additional diagnostic testing or treatment Examples only:  Drug therapy requiring intensive monitoring for toxicity  Decision for elective major surgery with identified pateint or procedure risk factors  Decision regarding hospitalization or escalation of level  of care  Decision for DNR or to de-escalate care   Parenteral controlled  substances            LEGEND - Independent interpretation involves the interpretation of a test for which there is a CPT code, and an interpretation or report is customary. When a review and interpretation of a test is performed and documented by the provider, but not separately reported (billed), then this would represent an independent interpretation. This report does not need to conform to the usual standards of a complete report of the test. This does not include interpretation of tests that do not have formal reports such as a complete blood count with differential and blood cultures. Examples would include reviewing a chest radiograph and documenting in the medical record an interpretation, but not separately reporting (billing) the interpretation of the chest radiograph.   An appropriate source includes professionals who are not health care professionals but may be involved in the management of the patient, such as a Clinical research associate, upper officer, case manager or teacher, and does not include discussion with family or informal caregivers.    - SDOH: SDOH are the conditions in the environments where people are born, live, learn, work, play, worship, and age that affect a wide range of health, functioning, and quality-of-life outcomes and risks. (e.g., housing, food insecurity, transportation, etc.). SDOH-related Z codes ranging from Z55-Z65 are the ICD-10-CM diagnosis codes used to document SDOH data Z55 - Problems related to education and literacy Z56 - Problems related to employment and unemployment Z57 - Occupational exposure to risk factors Z58 - Problems related to physical environment Z59 - Problems related to housing and economic circumstances [phone] - Problems related to social environment [phone] - Problems related to upbringing [phone] - Other problems related to primary support group, including family  circumstances Z40 - Problems related to certain psychosocial circumstances Z65 - Problems related to other psychosocial circumstances

## 2023-06-23 NOTE — Patient Instructions (Addendum)
ICD-10-CM   1. ILD (interstitial lung disease) (HCC)  J84.9     2. DOE (dyspnea on exertion)  R06.09     3. Chronic cough  R05.3         - I am concerned you  have Interstitial Lung Disease (ILD)  - maybe present in 2011 bu definitely since nov 2023 -> June 2024   - not sure if worse over time  -  There are MANY varieties of this  Plan - To narrow down possibilities and assess severity and future treament please do the following tests   Do blood autoimmune panel: Serum: ESR, ANA, DS-DNA, RF, anti-CCP, ssA, ssB, scl-70, ANCA, MPO and PR-3 antibodies, Total CK,  Aldolase,  - do blood  Hypersensitivity Pneumonitis Panel  - do blood Quantiferon Gold  - Do blood RAST allrgy panel  - we will dicuss on case conference  Follouwp  - spirometry/dlco late aug 2024/early sept 2024   -30 min visit to discuss results and decide next step; might be biopsy

## 2023-06-25 LAB — RHEUMATOID FACTOR: Rheumatoid fact SerPl-aCnc: <10 IU/mL (ref <14)

## 2023-06-26 LAB — QUANTIFERON-TB GOLD PLUS: QuantiFERON-TB Gold Plus: NEGATIVE

## 2023-06-27 LAB — ALDOLASE: Aldolase: 4.5 U/L (ref <=8.1)

## 2023-06-27 LAB — QUANTIFERON-TB GOLD PLUS: TB1-NIL: 0.03 IU/mL

## 2023-06-27 LAB — ANCA SCREEN W REFLEX TITER: ANCA SCREEN: NEGATIVE

## 2023-06-29 LAB — ANCA PROFILE
Anti-MPO Antibodies: <0.2 units (ref 0.0–0.9)
Anti-PR3 Antibodies: <0.2 units (ref 0.0–0.9)
Atypical pANCA: <1:20 {titer}
C-ANCA: <1:20 {titer}
P-ANCA: <1:20 {titer}

## 2023-06-29 LAB — ALLERGEN PROFILE, PERENNIAL ALLERGEN IGE
Alternaria Alternata IgE: 12.6 kU/L — AB
Aspergillus Fumigatus IgE: 1.33 kU/L — AB
Aureobasidi Pullulans IgE: 2.74 kU/L — AB
Candida Albicans IgE: 0.56 kU/L — AB
Cat Dander IgE: 1.78 kU/L — AB
Chicken Feathers IgE: <0.1 kU/L
Cladosporium Herbarum IgE: 2.51 kU/L — AB
Cow Dander IgE: <0.1 kU/L
D Farinae IgE: 0.16 kU/L — AB
D Pteronyssinus IgE: 0.11 kU/L — AB
Dog Dander IgE: 0.71 kU/L — AB
Duck Feathers IgE: <0.1 kU/L
Goose Feathers IgE: <0.1 kU/L
Mouse Urine IgE: <0.1 kU/L
Mucor Racemosus IgE: <0.1 kU/L
Penicillium Chrysogen IgE: 0.47 kU/L — AB
Phoma Betae IgE: 9.07 kU/L — AB
Setomelanomma Rostrat: 10.4 kU/L — AB
Stemphylium Herbarum IgE: 14.9 kU/L — AB

## 2023-06-29 LAB — QUANTIFERON-TB GOLD PLUS
Mitogen-NIL: >10 IU/mL
NIL: 0.02 IU/mL
TB2-NIL: 0.01 IU/mL

## 2023-06-29 LAB — HYPERSENSITIVITY PNEUMONITIS
A. Pullulans Abs: NEGATIVE
A.Fumigatus #1 Abs: NEGATIVE
Micropolyspora faeni, IgG: NEGATIVE
Pigeon Serum Abs: NEGATIVE
Thermoact. Saccharii: NEGATIVE
Thermoactinomyces vulgaris, IgG: NEGATIVE

## 2023-06-29 LAB — ANA+ENA+DNA/DS+SCL 70+SJOSSA/B
ANA Titer 1: NEGATIVE
ENA RNP Ab: 0.2 AI (ref 0.0–0.9)
ENA SM Ab Ser-aCnc: <0.2 AI (ref 0.0–0.9)
ENA SSA (RO) Ab: <0.2 AI (ref 0.0–0.9)
ENA SSB (LA) Ab: <0.2 AI (ref 0.0–0.9)
Scleroderma (Scl-70) (ENA) Antibody, IgG: <0.2 AI (ref 0.0–0.9)
dsDNA Ab: <1 IU/mL (ref 0–9)

## 2023-06-29 LAB — CYCLIC CITRUL PEPTIDE ANTIBODY, IGG: Cyclic Citrullin Peptide Ab: <16 UNITS

## 2023-06-29 LAB — PROTEINASE-3 AUTO ABS: Serine Protease 3: <1 AI (ref <1.0)

## 2023-07-03 ENCOUNTER — Telehealth: Payer: Self-pay | Admitting: Internal Medicine

## 2023-07-03 NOTE — Telephone Encounter (Signed)
Autoimmune panel negative but bloood allergy panel positive for many things  Plan  - refer allergist - [facility] or [facility]     Latest Reference Range & Units 06/23/23 12:15  BASIC METABOLIC PANEL  Rpt !  Sodium 135 - 145 mEq/L 133 (L)  Potassium 3.5 - 5.1 mEq/L 3.5  Chloride 96 - 112 mEq/L 101  CO2 19 - 32 mEq/L 23  Glucose 70 - 99 mg/dL 829 (H)  BUN 6 - 23 mg/dL 8  Creatinine 5.62 - 1.30 mg/dL 8.65  Calcium 8.4 - 78.4 mg/dL 9.2  Alkaline Phosphatase 39 - 117 U/L 89  Albumin 3.5 - 5.2 g/dL 3.9  AST 0 - 37 U/L 18  ALT 0 - 53 U/L 21  Total Protein 6.0 - 8.3 g/dL 7.5  Bilirubin, Direct 0.0 - 0.3 mg/dL 0.3  Total Bilirubin 0.2 - 1.2 mg/dL 1.0  GFR >69.62 mL/min 93.42  CK Total 7 - 232 U/L 92  Aldolase < OR = 8.1 U/L 4.5  WBC 4.0 - 10.5 K/uL 8.1  RBC 4.22 - 5.81 Mil/uL 4.43  Hemoglobin 13.0 - 17.0 g/dL 95.2  HCT 84.1 - 32.4 % 43.0  MCV 78.0 - 100.0 fl 97.1  MCHC 30.0 - 36.0 g/dL 40.1  RDW 02.7 - 25.3 % 13.3  Platelets 150.0 - 400.0 K/uL 308.0  Neutrophils 43.0 - 77.0 % 72.6  Lymphocytes 12.0 - 46.0 % 12.0  Monocytes Relative 3.0 - 12.0 % 10.1  Eosinophil 0.0 - 5.0 % 3.8  Basophil 0.0 - 3.0 % 1.5  NEUT# 1.4 - 7.7 K/uL 5.9  Lymphocyte # 0.7 - 4.0 K/uL 1.0  Monocyte # 0.1 - 1.0 K/uL 0.8  Eosinophils Absolute 0.0 - 0.7 K/uL 0.3  Basophils Absolute 0.0 - 0.1 K/uL 0.1  Sed Rate 0 - 20 mm/hr 28 (H)  Class Description Allergens  Comment  D Pteronyssinus IgE Class 0/I kU/L 0.11 !  D Farinae IgE Class 0/I kU/L 0.16 !  Cat Dander IgE Class III kU/L 1.78 !  Dog Dander IgE Class II kU/L 0.71 !  Penicillium Chrysogen IgE Class I kU/L 0.47 !  Cladosporium Herbarum IgE Class III kU/L 2.51 !  Aspergillus Fumigatus IgE Class II kU/L 1.33 !  Mucor Racemosus IgE Class 0 kU/L <0.10  Alternaria Alternata IgE Class IV kU/L 12.60 !  Stemphylium Herbarum IgE Class IV kU/L 14.90 !  Goose Feathers IgE Class 0 kU/L <0.10  Chicken Feathers IgE Class 0 kU/L <0.10  Duck Feathers  IgE Class 0 kU/L <0.10  Mouse Urine IgE Class 0 kU/L <0.10  ANA Titer 1  Negative  ANCA SCREEN Negative  Negative  Atypical pANCA Neg:<1:20 titer <1:20  Cyclic Citrullin Peptide Ab UNITS <16  dsDNA Ab 0 - 9 IU/mL <1  ENA RNP Ab 0.0 - 0.9 AI 0.2  ENA SSA (RO) Ab 0.0 - 0.9 AI <0.2  ENA SSB (LA) Ab 0.0 - 0.9 AI <0.2  Serine Protease 3 <1.0 AI <1.0  RA Latex Turbid. <14 IU/mL <10  Cytoplasmic (C-ANCA) Neg:<1:20 titer <1:20  P-ANCA Neg:<1:20 titer <1:20  ENA SM Ab Ser-aCnc 0.0 - 0.9 AI <0.2  Anti-MPO Antibodies 0.0 - 0.9 units <0.2  Anti-PR3 Antibodies 0.0 - 0.9 units <0.2  Scleroderma (Scl-70) (ENA) Antibody, IgG 0.0 - 0.9 AI <0.2  !: Data is abnormal (L): Data is abnormally low (H): Data is abnormally high Rpt: View report in Results Review for more information

## 2023-07-06 ENCOUNTER — Other Ambulatory Visit: Payer: Self-pay | Admitting: Family

## 2023-07-06 NOTE — Telephone Encounter (Signed)
Please disregard previous statement.

## 2023-07-06 NOTE — Telephone Encounter (Signed)
Med denied- Endo has her on Jardiance 25 mg.

## 2023-07-06 NOTE — Telephone Encounter (Signed)
Patient would like results of labwork. Patient phone number is [phone].

## 2023-07-07 NOTE — Telephone Encounter (Signed)
Patient checking on message for labwork results. Patient phone number is [phone].

## 2023-07-08 NOTE — Telephone Encounter (Signed)
I called and and spoke with the pt and notified of results/recs per MR  He declined allergy referral  Will discuss more at next visit  Nothing further needed

## 2023-07-21 ENCOUNTER — Other Ambulatory Visit: Payer: Self-pay | Admitting: Family

## 2023-07-31 ENCOUNTER — Telehealth: Payer: Self-pay | Admitting: Internal Medicine

## 2023-07-31 NOTE — Telephone Encounter (Signed)
Pt calling to see if he wanted to get biopsy done

## 2023-08-07 NOTE — Telephone Encounter (Signed)
Spoke with patient.  He is just curious what the outcome will be of the meeting the providers have regarding his health.  Advise patient he will be contacted once a decision is made.

## 2023-08-10 ENCOUNTER — Ambulatory Visit: Payer: Self-pay | Admitting: Internal Medicine

## 2023-08-10 NOTE — Progress Notes (Cosign Needed)
Interstitial Lung Disease Multidisciplinary Conference   Mark Doyle    MRN [id number]    DOB June 11, 1956  Primary Care Physician:Murray, Allyne Gee, FNP  Referring Physician: Dr. Marchelle Gearing  Time of Conference: 7.00am- 8.00am Date of conference: 08/04/2023 Location of Conference: -  Virtual  Participating Pulmonary: Dr. Kalman Shan, Dr Chilton [place] Pathology: - Radiology: Dr Allegra Lai Others: n/a  Brief History: 67 year old with mild ILD in > 2022 v not. Definitely seen nov 2023 and now June 2024. Seriology pending. Some mold in the house. 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 Really exposed.  PFT mild restriction. Has DOE/cough x 6 months getting worse.  Discussion: 1. What is pattern? 2. Biopsy? 3. Progession?   PFT    Latest Ref Rng & Units 06/01/2023    2:42 PM 01/15/2023   10:42 AM 10/02/2021    9:14 AM  PFT Results  FVC-Pre L 2.94  2.38  2.10   FVC-Predicted Pre % 55  46  40   FVC-Post L  2.35  2.28   FVC-Predicted Post %  46  44   Pre FEV1/FVC % % 69  65  58   Post FEV1/FCV % %  67  60   FEV1-Pre L 2.02  1.54  1.22   FEV1-Predicted Pre % 51  40  31   FEV1-Post L  1.58  1.38   DLCO uncorrected ml/min/mmHg 21.73  17.32  17.43   DLCO UNC% % 72  59  59   DLCO corrected ml/min/mmHg 21.73  17.27  17.43   DLCO COR %Predicted % 72  59  59   DLVA Predicted % 99  93  97   TLC L  5.72  5.84   TLC % Predicted %  75  76   RV % Predicted %  111  132       MDD discussion of CT scan    - Date or time period of scan:  HRCT: 05/28/2023 CT Chest WO: 10/25/2022  - Discussion synopsis:  In June 2024 CT: more predominant basal lung reticulation in subpleural cranio-caudal gradient. . No traction bronchilectasis. No air trapping. No honeycombing. Similar changes in 2023 . No porgerssion 2023 -> 2022. Totally new from 2011 CT chest. Conference radiologist did not have a CT from 2022. No air trapping.   - What is the final conclusion per 2018  ATS/Fleischner Criteria - Conclusion: Very early/mild ILD  - Concordance with official report: CONCORDANT  Pathology discussion of biopsy: n/a    MDD Impression/Recs: Biopsy if serologies negative   Time Spent in preparation and discussion:  > 30 min    SIGNATURE   Dr. Kalman Shan, M.D., F.C.C.P,  Pulmonary and Critical Care Medicine Staff Physician, [facility] Center Director - Interstitial Lung Disease  Program  Pulmonary Fibrosis [facility] [place], [place], [address]  Pager: [phone], If no answer or between  15:00h - 7:00h: call [phone] Telephone: [phone]  9:24 PM 08/10/2023 ...................................................................................................................Marland Kitchen References: Diagnosis of Hypersensitivity Pneumonitis in Adults. An Official ATS/JRS/ALAT Clinical Practice Guideline. Ragu G et al, Am J Respir Crit Care Med. 2020 Aug 1;202(3):e36-e69.       Diagnosis of Idiopathic Pulmonary Fibrosis. An Official ATS/ERS/JRS/ALAT Clinical Practice Guideline. Raghu G et al, Am J Respir Crit Care Med. 2018 Sep 1;198(5):e44-e68.   IPF Suspected   Histopath ology Pattern      UIP  Probable  UIP  Indeterminate for  UIP  Alternative  diagnosis    UIP  IPF  IPF  IPF  Non-IPF dx   HRCT   Probabe UIP  IPF  IPF  IPF (Likely)**  Non-IPF dx  Pattern  Indeterminate for UIP  IPF  IPF (Likely)**  Indeterminate  for IPF**  Non-IPF dx    Alternative diagnosis  IPF (Likely)**/ non-IPF dx  Non-IPF dx  Non-IPF dx  Non-IPF dx     Idiopathic pulmonary fibrosis diagnosis based upon HRCT and Biopsy paterns.  ** IPF is the likely diagnosis when any of following features are present:  Moderate-to-severe traction bronchiectasis/bronchiolectasis (defined as mild traction bronchiectasis/bronchiolectasis in four or more lobes including the lingual as a lobe, or moderate to  severe traction bronchiectasis in two or more lobes) in a man over age 67 years or in a woman over age 41 years Extensive (>30%) reticulation on HRCT and an age >70 years  Increased neutrophils and/or absence of lymphocytosis in BAL fluid  Multidisciplinary discussion reaches a confident diagnosis of IPF.   **Indeterminate for IPF  Without an adequate biopsy is unlikely to be IPF  With an adequate biopsy may be reclassified to a more specific diagnosis after multidisciplinary discussion and/or additional consultation.   dx = diagnosis; HRCT = high-resolution computed tomography; IPF = idiopathic pulmonary fibrosis; UIP = usual interstitial pneumonia.

## 2023-08-19 DIAGNOSIS — H43823 Vitreomacular adhesion, bilateral: Secondary | ICD-10-CM | POA: Diagnosis not present

## 2023-08-19 DIAGNOSIS — H353132 Nonexudative age-related macular degeneration, bilateral, intermediate dry stage: Secondary | ICD-10-CM | POA: Diagnosis not present

## 2023-08-28 ENCOUNTER — Other Ambulatory Visit: Payer: Self-pay | Admitting: Family

## 2023-08-28 ENCOUNTER — Other Ambulatory Visit: Payer: Self-pay | Admitting: Pulmonary Disease

## 2023-09-23 ENCOUNTER — Ambulatory Visit: Payer: 59 | Admitting: Internal Medicine

## 2023-09-23 DIAGNOSIS — J849 Interstitial pulmonary disease, unspecified: Secondary | ICD-10-CM | POA: Diagnosis not present

## 2023-09-23 LAB — PULMONARY FUNCTION TEST
DL/VA % pred: 83 %
DL/VA: 3.36 ml/min/mmHg/L
DLCO cor % pred: 60 %
DLCO cor: 18.09 ml/min/mmHg
DLCO unc % pred: 60 %
DLCO unc: 18.09 ml/min/mmHg
FEF 25-75 Pre: 1.32 L/s
FEF2575-%Pred-Pre: 43 %
FEV1-%Pred-Pre: 50 %
FEV1-Pre: 1.97 L
FEV1FVC-%Pred-Pre: 94 %
FEV6-%Pred-Pre: 56 %
FEV6-Pre: 2.8 L
FEV6FVC-%Pred-Pre: 105 %
FVC-%Pred-Pre: 53 %
FVC-Pre: 2.8 L
Pre FEV1/FVC ratio: 70 %
Pre FEV6/FVC Ratio: 100 %

## 2023-09-23 NOTE — Progress Notes (Signed)
Spirometry/DLCO performed today. 

## 2023-09-23 NOTE — Patient Instructions (Signed)
Spirometry/DLCO performed today. 

## 2023-09-24 ENCOUNTER — Telehealth: Payer: Self-pay | Admitting: Internal Medicine

## 2023-09-24 NOTE — Telephone Encounter (Signed)
Pt calling for PFT Results

## 2023-09-25 NOTE — Telephone Encounter (Signed)
Patient checking on results of PFT. Patient phone number is [phone].

## 2023-09-26 ENCOUNTER — Other Ambulatory Visit: Payer: Self-pay | Admitting: Pulmonary Disease

## 2023-09-28 NOTE — Telephone Encounter (Signed)
Spoke to patient. He is aware that a message a been sent to MR to review results.  Advised patient that our office would contact him with results once reviewed.

## 2023-09-28 NOTE — Telephone Encounter (Signed)
Patient is returning phone call. Patient phone number is [phone].

## 2023-09-28 NOTE — Telephone Encounter (Signed)
Lm x1 for patient.   MR, please advise on PFT results. Thanks

## 2023-09-29 NOTE — Telephone Encounter (Signed)
The PFTs appear to be stable over time.  Last seen in July 2024.  male he  supposed to come and see me now after the PFTs will continue further discussion.       Latest Ref Rng & Units 09/23/2023    9:59 AM 06/01/2023    2:42 PM 01/15/2023   10:42 AM 10/02/2021    9:14 AM  PFT Results  FVC-Pre L 2.80  P 2.94  2.38  2.10   FVC-Predicted Pre % 53  P 55  46  40   FVC-Post L   2.35  2.28   FVC-Predicted Post %   46  44   Pre FEV1/FVC % % 70  P 69  65  58   Post FEV1/FCV % %   67  60   FEV1-Pre L 1.97  P 2.02  1.54  1.22   FEV1-Predicted Pre % 50  P 51  40  31   FEV1-Post L   1.58  1.38   DLCO uncorrected ml/min/mmHg 18.09  P 21.73  17.32  17.43   DLCO UNC% % 60  P 72  59  59   DLCO corrected ml/min/mmHg 18.09  P 21.73  17.27  17.43   DLCO COR %Predicted % 60  P 72  59  59   DLVA Predicted % 83  P 99  93  97   TLC L   5.72  5.84   TLC % Predicted %   75  76   RV % Predicted %   111  132     P Preliminary result

## 2023-09-30 NOTE — Telephone Encounter (Signed)
Patient is aware of results/recommendations and voiced his understanding.  He will keep scheduled appt for 10/28. Nothing further needed.

## 2023-09-30 NOTE — Telephone Encounter (Signed)
Lm x1 for patient.  

## 2023-10-14 ENCOUNTER — Telehealth: Payer: Self-pay | Admitting: Family

## 2023-10-14 NOTE — Telephone Encounter (Signed)
Pt called requesting Cologuard kit to be sent to him. Please Advise.

## 2023-10-15 NOTE — Telephone Encounter (Signed)
Please advise 

## 2023-10-16 NOTE — Telephone Encounter (Signed)
Scheduled pt a follow up appointment 10/23/2023.

## 2023-10-16 NOTE — Telephone Encounter (Signed)
Called pt and left a VM asking pt to call the office back.

## 2023-10-18 ENCOUNTER — Other Ambulatory Visit: Payer: Self-pay | Admitting: Family

## 2023-10-19 ENCOUNTER — Encounter: Payer: Self-pay | Admitting: Internal Medicine

## 2023-10-19 ENCOUNTER — Ambulatory Visit (INDEPENDENT_AMBULATORY_CARE_PROVIDER_SITE_OTHER): Payer: 59 | Admitting: Internal Medicine

## 2023-10-19 VITALS — BP 128/72 | HR 93 | Ht 74.0 in | Wt 242.0 lb

## 2023-10-19 DIAGNOSIS — J4540 Moderate persistent asthma, uncomplicated: Secondary | ICD-10-CM

## 2023-10-19 DIAGNOSIS — J849 Interstitial pulmonary disease, unspecified: Secondary | ICD-10-CM | POA: Diagnosis not present

## 2023-10-19 DIAGNOSIS — R0609 Other forms of dyspnea: Secondary | ICD-10-CM | POA: Diagnosis not present

## 2023-10-19 DIAGNOSIS — J302 Other seasonal allergic rhinitis: Secondary | ICD-10-CM

## 2023-10-19 DIAGNOSIS — Z9109 Other allergy status, other than to drugs and biological substances: Secondary | ICD-10-CM

## 2023-10-19 DIAGNOSIS — R053 Chronic cough: Secondary | ICD-10-CM

## 2023-10-19 NOTE — Patient Instructions (Addendum)
ICD-10-CM   1. ILD (interstitial lung disease) (HCC)  J84.9     2. DOE (dyspnea on exertion)  R06.09     3. Chronic cough  R05.3     4. Seasonal allergies  J30.2     5. House dust mite allergy  Z91.09     6. Moderate persistent extrinsic asthma without complication  J45.40          - I am concerned you  have Interstitial Lung Disease (ILD)  - ABSENT in 2011 and seen 2023 -> 2024 but without change   - cause not kwnon deespite conference discussion and blood work   - can be NSIP, v HP v IPF  - you also have allergic asthma with dust mite, cat, dog, and other allergies  Plan -#ILD  - refer DR Dorris Fetch or [place] for surgical lung biopsy   - shared decision making to not do bronchoscopy method  #Allergies  - refer Allergies - ideally one in [place]  - continue breztri  - respect no flu shot  Follouwp  -10 weeks; to discuss results

## 2023-10-19 NOTE — Progress Notes (Signed)
PMH asthma, seasonal allergies, HTN, never smoker. He is SOB when he walks. He uses his sister nebulizer and OTC primatime mist.  Here today to establish care with new primary pulmonologist.  He does not have a primary care doctor.  He does have ongoing symptoms of dyspnea on exertion.  Nocturnal cough.  He has had asthma for a long time.  Still using over-the-counter Primatene Mist.  And using his sister's nebulizer machine on occasion.  He does not have any maintenance inhalers or no albuterol to use at home.  He does have seasonal allergies.  No other significant past medical history.  Please see below.  Patient had a coughing episode a few weeks ago developed right-sided abdominal and flank pain.  He feels like he pulled a muscle its been sore.  He has been using a heating pad regularly.  OV 10/02/2021: Here today for follow-up after pulmonary function tests.  He has a new FEV1 of only 36% predicted with significant bronchodilator response, significant air trapping.  The non-smoker with a longstanding history of asthma.  Is like he has had significant airway remodeling.  He has not been using a maintenance inhaler.  He did not like using Advair or Breo.  It caused him to have significant sore throat.  So he stopped using it.  Only using albuterol at this time.  Presents today to the office with ongoing cough chest pain shortness of breath.  He feels very raspy at times and still having ongoing wheezing which is worse at nighttime.  OV 04/07/2022: Here today for follow-up regarding severe obstructive disease.  Presumed related to longstanding history of asthma with airway remodeling.  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  However he has not had any exacerbations in the past year.  Doing well with his Breztri inhaler with spacer.  He has also gotten rid of his sore throat that he had associated with use of his previous  inhalers.  He does much better on MDI than having a dry powder.  He is able to complete his activities of daily living but he does have some shortness of breath still with exertion.  OV 11/24/2022: Here today for follow-up regarding severe obstructive disease longstanding history of asthma presumed related to airway remodeling.  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  Has been using his inhalers regularly.  Had a recent CT scan of the chest that shows some peripheral interstitial changes.     02/23/2023: Today-follow-up Patient presents today for follow-up after 6-minute walk test, ordered by Dr. Tonia Brooms.  He also had overnight oximetry, which she completed last night and returned this morning.  He did not have any desaturations on his with SpO2 low of 94%.  ONO results have yet to be received.  He tells me today that he is feeling relatively the same as when he was here last.  Still has shortness of breath with exertion, including ADLs and walking on a flat surface at his own speed.  He has a daily, productive cough with clear sputum.  No increased chest congestion or wheezing.  He is using his Breztri inhaler, usually 4 times a day.  Does not use his albuterol rescue inhaler much.  He does feel like he benefits from use of the [place].  No recent hospitalizations.  Has not required any steroids or antibiotics in the  last 6 months.  OV 06/23/2023 -referred by Dr. Elige Radon Icard to the ILD center to see Dr. Marchelle Gearing.  Subjective:  Patient ID: Mark Doyle, male , DOB: 02-26-1956 , age 67 y.o. , MRN: [id number] , ADDRESS: [address] [place] [address] PCP Olive Bass, FNP Patient Care Team: Olive Bass, FNP as PCP - General (Internal Medicine)  This Provider for this visit: Treatment Team:  Attending Provider: Kalman Shan, MD    06/23/2023 -   Chief Complaint  Patient presents with   Follow-up     Breathing has no change  SOB  ACT 10     HPI Mark Doyle 67 y.o. - male he is a resident of [place].  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  But this was over 30 years ago and then moved to [place].  He is currently retired.  He said he was told that he had "pulmonary lung disease" and this resulted in CT scan of the chest that showed "fibrosis" and then he was referred to see me.  His current symptom scores are below.  He says symptoms are progressive.  Since seeing Dr. Tonia Brooms no new medical problems no surgeries no hospitalizations no ER visits no medication changes.  He is got insidious onset of shortness of breath gradually getting worse for the last 3 years.  He says he was born with asthma.  He also has a cough for the last 3 years.  It is also getting worse.  The cough is worse at night.  He does bring up yellow phlegm.  He is on inhaler for this.  He does clear his throat.  Sometimes the coughing does affect his voice but otherwise no hemoptysis no feeling of tickle in his throat.    [facility] Comprehensive ILD Questionnaire  Symptoms:      Past Medical History :  -He states he was born with asthma.  He is also suffers from obesity - Denies any collagen vascular disease or connective tissue disease or vasculitis - Denies any heart disease Denies pulmonary hypertension diabetes thyroid disease.  Denies any strokes denies kidney disease. -Never had COVID-vaccine.  Never had COVID disease.   ROS:  Detailed review of systems is negative for any symptoms other than shortness of breath.  FAMILY HISTORY of LUNG DISEASE:  -Asthma present to the mother but otherwise detailed family history for pulmonary disease including connective tissue disease and premature graying of the head is negative.  PERSONAL EXPOSURE HISTORY:  -Never smoked any cigarettes.  He has done some passive smoking.  He did smoke some marijuana 1974 very  little.  No cocaine no intravenous drug use  HOME  EXPOSURE and HOBBY DETAILS :  -He lives in a single-family home in the urban setting for the last 17 years.  Home was built in [place].  There is mold/mildew in the main bathroom on the ceiling and sometimes uses bleach to get rid of it.  He did have a water leak under the house in 2022 but it dried out.  Otherwise detail organic antigen exposure history in the house is negative  OCCUPATIONAL HISTORY (122 questions) : -He is retired.  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  He has been exposed to diabetic condition spaces.  Exposed to warehouses.  Has done some woodwork.  Exposed to asbestos.  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  No medications for pulmonary eosinophilia  INVESTIGATIONS: x    HRCT Chest data 05/28/23 - personally visualized and independently interpreted and my findings are: - agree with officail aread but also propr CT: maybe present in 2011 bu definitely since nov 2023 -> June 2024 .  - not sure if worse over time    Narrative & Impression  CLINICAL DATA:  67 year old male with history of chronic cough and shortness of breath. Evaluate for interstitial lung disease.   EXAM: CT CHEST WITHOUT CONTRAST   TECHNIQUE: Multidetector CT imaging of the chest was performed following the standard protocol without intravenous contrast. High resolution imaging of the lungs, as well as inspiratory and expiratory imaging, was performed.   RADIATION DOSE REDUCTION: This exam was performed according to the departmental dose-optimization program which includes automated exposure control, adjustment of the mA and/or kV according to patient size and/or use of iterative reconstruction technique.   COMPARISON:  Chest CT 10/25/2022.   FINDINGS: Cardiovascular: Heart size  is normal. There is no significant pericardial fluid, thickening or pericardial calcification. There is aortic atherosclerosis, as well as atherosclerosis of the great vessels of the mediastinum and the coronary arteries, including calcified atherosclerotic plaque in the left main, left anterior descending, left circumflex and right coronary arteries.   Mediastinum/Nodes: No pathologically enlarged mediastinal or hilar lymph nodes. Please note that accurate exclusion of hilar adenopathy is limited on noncontrast CT scans. Esophagus is unremarkable in appearance. No axillary lymphadenopathy.   Lungs/Pleura: High-resolution images again demonstrate widespread areas of mild ground-glass attenuation, septal thickening, subpleural reticulation, thickening of the peribronchovascular interstitium and regional architectural distortion. These findings are most evident in the mid to lower lungs bilaterally. No definite traction bronchiectasis or honeycombing. No substantial progression of disease compared to the prior examination. Inspiratory and expiratory imaging demonstrates some mild air trapping indicative of mild small airways disease. There is also partial collapse of the trachea and mainstem bronchi, indicative of tracheobronchomalacia. No acute consolidative airspace disease. No pleural effusions. No definite suspicious appearing pulmonary nodules or masses are noted.   Upper Abdomen: Diffuse low attenuation throughout the visualized hepatic parenchyma, indicative of a background of hepatic steatosis. Status post cholecystectomy.   Musculoskeletal: There are no aggressive appearing lytic or blastic lesions noted in the visualized portions of the skeleton.   IMPRESSION: 1. The appearance of the lungs is stable compared to the prior study, once again categorized as indeterminate for usual interstitial pneumonia (UIP) per current ATS guidelines. Repeat high-resolution chest CT should  be considered in 12 months to assess for temporal changes in the appearance of the lung parenchyma. 2. Mild air trapping indicative of small airways disease. 3. Mild tracheobronchomalacia. 4. Hepatic steatosis. 5. Aortic atherosclerosis, in addition to left main and three-vessel coronary artery disease. Please note that although the presence of coronary artery calcium documents the presence of coronary artery disease, the severity of this disease and any potential stenosis cannot be assessed on this non-gated CT examination. Assessment for potential risk factor modification, dietary therapy or pharmacologic therapy may be warranted, if clinically indicated.   Aortic Atherosclerosis (ICD10-I70.0).     Electronically Signed   By: Trudie Reed M.D.   On: 06/03/2023 09:56    Latest Reference Range & Units 07/09/21 11:21 10/17/22 09:56  Creatinine 0.40 - 1.50 mg/dL 0.98 1.19    Latest Reference Range & Units 07/09/21  11:21 10/17/22 09:56  Hemoglobin 13.0 - 17.0 g/dL 54.0 98.1    Latest Reference Range & Units 07/09/21 11:21 10/17/22 09:56  Eosinophils Absolute 0.0 - 0.7 K/uL 0.5 0.4    erstitial Lung Disease Multidisciplinary Conference 08/04/23   Josephus Cruthirds    MRN [id number]    DOB July 14, 1956  Primary Care Physician:Murray, Allyne Gee, FNP  Referring Physician: Dr. Marchelle Gearing  Time of Conference: 7.00am- 8.00am Date of conference: 08/04/2023 Location of Conference: -  Virtual  Participating Pulmonary: Dr. Kalman Shan, Dr Chilton Greathouse Pathology: - Radiology: Dr Allegra Lai Others: n/a  Brief History: 67 year old with mild ILD in > 2022 v not. Definitely seen nov 2023 and now June 2024. Seriology pending. Some mold in the house. 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 Really exposed.  PFT mild restriction. Has DOE/cough x 6 months getting worse.  Discussion: 1. What is pattern? 2. Biopsy? 3. Progession?   PFT   MDD discussion of CT scan     - Date or time period of scan:  HRCT: 05/28/2023 CT Chest WO: 10/25/2022  - Discussion synopsis:  In June 2024 CT: more predominant basal lung reticulation in subpleural cranio-caudal gradient. . No traction bronchilectasis. No air trapping. No honeycombing. Similar changes in 2023 . No porgerssion 2023 -> 2022. Totally new from 2011 CT chest. Conference radiologist did not have a CT from 2022. No air trapping.   - What is the final conclusion per 2018 ATS/Fleischner Criteria - Conclusion: Very early/mild ILD  - Concordance with official report: CONCORDANT  Pathology discussion of biopsy: n/a    MDD Impression/Recs: Biopsy if serologies negative   OV 10/19/2023  Subjective:  Patient ID: Mark Doyle, male , DOB: Nov 12, 1956 , age 29 y.o. , MRN: [id number] , ADDRESS: [address] [place] [address] PCP Olive Bass, FNP Patient Care Team: Olive Bass, FNP as PCP - General (Internal Medicine)  This Provider for this visit: Treatment Team:  Attending Provider: Kalman Shan, MD    10/19/2023 -   Chief Complaint  Patient presents with   ILD (interstitial lung disease)     HPI Mark Doyle 67 y.o. -  #Interstitial lung disease: Discussed at case conference this very early/mild ILD.  It slightly stable in the last few years but definitely new since 2011.  He does have crackles.  His serologies negative.  However he has had a previous exposure in the mold setting many decades ago.  In addition his blood Aspergillus test is positive.  However his CT is read as indeterminate for UIP.  Therefore this could be IPF versus HP or NSIP.  We discussed getting a definitive biopsy.  Offered both bronchoscopy method which is minimally invasive but less chance of a positive yield was a surgical lung biopsy which is the gold standard.  He decided to go ahead with the surgical lung biopsy.  His pulmonary function test currently stable.  His excess  hypoxemia test is also pretty good.  He does have risk factors for surgery but his functional status is good and he does not desaturate.  Surgical lung biopsy would be technically considered a major surgery.  Discussed overall risk.  Have referred him to surgery.  #Symptoms of cough and shortness of breath.  There is ongoing symptoms for him.  He has a history of seasonal allergies.  Therefore he ran a blood allergy test his blood eosinophils are on the higher side.  He has significant positivity to  dog dander [he has 2 dogs], cat, dust mite allergy.  His house is built in the 1950s.  He is on Breztri but still has significant symptoms.  Recommended allergist referral and he has agreed.  He wants to get referred to someone in [facility]  Flu shot offered but he declined. Interim Health status: No new complaints No new medical problems. No new surgeries. No ER visits. No Urgent care visits. No changes to medications     SYMPTOM SCALE - ILD 06/24/2023 10/19/2023   Current weight    O2 use ra ra  Shortness of Breath 0 -> 5 scale with 5 being worst (score 6 If unable to do)   At rest 3 1  Simple tasks - showers, clothes change, eating, shaving 3 3  Household (dishes, doing bed, laundry) 3 3  Shopping 3 4  Walking level at own pace 4 4  Walking up Stairs 5 5  Total (30-36) Dyspnea Score 21 20      Non-dyspnea symptoms (0-> 5 scale) 06/24/2023 10/19/2023   How bad is your cough? 4 3  How bad is your fatigue 4 3  How bad is nausea 0 0  How bad is vomiting?  0 0  How bad is diarrhea? 0 0  How bad is anxiety? 4 0  How bad is depression 3 0  Any chronic pain - if so where and how bad x 0         Simple office walk 224 (66+46 x 2) feet Pod A at [facility] x  3 laps goal with forehead probe 06/23/2023  10/19/2023   O2 used ra ra  Number laps completed Sit stand  x 10 Sit and stand x 10  Comments about pace good   Resting Pulse Ox/HR 96% and 77/min 99% and nd 93  Final Pulse Ox/HR 96%  and 91/min 97% and 112  Desaturated </= 88% no   Desaturated <= 3% points no   Got Tachycardic >/= 90/min yes   Symptoms at end of test Mild dyspnea   Miscellaneous comments x     PFT     Latest Ref Rng & Units 09/23/2023    9:59 AM 06/01/2023    2:42 PM 01/15/2023   10:42 AM 10/02/2021    9:14 AM  ILD indicators  FVC-Pre L 2.80  P 2.94  2.38  2.10   FVC-Predicted Pre % 53  P 55  46  40   FVC-Post L   2.35  2.28   FVC-Predicted Post %   46  44   TLC L   5.72  5.84   TLC Predicted %   75  76   DLCO uncorrected ml/min/mmHg 18.09  P 21.73  17.32  17.43   DLCO UNC %Pred % 60  P 72  59  59   DLCO Corrected ml/min/mmHg 18.09  P 21.73  17.27  17.43   DLCO COR %Pred % 60  P 72  59  59     P Preliminary result    Latest Reference Range & Units 06/23/23 12:15  BASIC METABOLIC PANEL  Rpt !  Sodium 135 - 145 mEq/L 133 (L)  Potassium 3.5 - 5.1 mEq/L 3.5  Chloride 96 - 112 mEq/L 101  CO2 19 - 32 mEq/L 23  Glucose 70 - 99 mg/dL 130 (H)  BUN 6 - 23 mg/dL 8  Creatinine 8.65 - 7.84 mg/dL 6.96  Calcium 8.4 - 29.5 mg/dL 9.2  Alkaline Phosphatase 39 -  117 U/L 89  Albumin 3.5 - 5.2 g/dL 3.9  AST 0 - 37 U/L 18  ALT 0 - 53 U/L 21  Total Protein 6.0 - 8.3 g/dL 7.5  Bilirubin, Direct 0.0 - 0.3 mg/dL 0.3  Total Bilirubin 0.2 - 1.2 mg/dL 1.0  GFR >73.22 mL/min 93.42  CK Total 7 - 232 U/L 92  Aldolase < OR = 8.1 U/L 4.5  WBC 4.0 - 10.5 K/uL 8.1  RBC 4.22 - 5.81 Mil/uL 4.43  Hemoglobin 13.0 - 17.0 g/dL 02.5  HCT 42.7 - 06.2 % 43.0  MCV 78.0 - 100.0 fl 97.1  MCHC 30.0 - 36.0 g/dL 37.6  RDW 28.3 - 15.1 % 13.3  Platelets 150.0 - 400.0 K/uL 308.0  Neutrophils 43.0 - 77.0 % 72.6  Lymphocytes 12.0 - 46.0 % 12.0  Monocytes Relative 3.0 - 12.0 % 10.1  Eosinophil 0.0 - 5.0 % 3.8  Basophil 0.0 - 3.0 % 1.5  NEUT# 1.4 - 7.7 K/uL 5.9  Lymphs Abs 0.7 - 4.0 K/uL 1.0  Monocyte # 0.1 - 1.0 K/uL 0.8  Eosinophils Absolute 0.0 - 0.7 K/uL 0.3  Basophils Absolute 0.0 - 0.1 K/uL 0.1  Sed Rate 0 - 20  mm/hr 28 (H)  Class Description Allergens  Comment  D Pteronyssinus IgE Class 0/I kU/L 0.11 !  D Farinae IgE Class 0/I kU/L 0.16 !  Cat Dander IgE Class III kU/L 1.78 !  Dog Dander IgE Class II kU/L 0.71 !  Penicillium Chrysogen IgE Class I kU/L 0.47 !  Cladosporium Herbarum IgE Class III kU/L 2.51 !  Aspergillus Fumigatus IgE Class II kU/L 1.33 !  Mucor Racemosus IgE Class 0 kU/L <0.10  Alternaria Alternata IgE Class IV kU/L 12.60 !  Stemphylium Herbarum IgE Class IV kU/L 14.90 !  Goose Feathers IgE Class 0 kU/L <0.10  Chicken Feathers IgE Class 0 kU/L <0.10  Duck Feathers IgE Class 0 kU/L <0.10  Mouse Urine IgE Class 0 kU/L <0.10  ANA Titer 1  Negative  ANCA SCREEN Negative  Negative  Atypical pANCA Neg:<1:20 titer <1:20  Cyclic Citrullin Peptide Ab UNITS <16  dsDNA Ab 0 - 9 IU/mL <1  ENA RNP Ab 0.0 - 0.9 AI 0.2  ENA SSA (RO) Ab 0.0 - 0.9 AI <0.2  ENA SSB (LA) Ab 0.0 - 0.9 AI <0.2  Serine Protease 3 <1.0 AI <1.0  RA Latex Turbid. <14 IU/mL <10  Cytoplasmic (C-ANCA) Neg:<1:20 titer <1:20  P-ANCA Neg:<1:20 titer <1:20  ENA SM Ab Ser-aCnc 0.0 - 0.9 AI <0.2  Anti-MPO Antibodies 0.0 - 0.9 units <0.2  Anti-PR3 Antibodies 0.0 - 0.9 units <0.2  Scleroderma (Scl-70) (ENA) Antibody, IgG 0.0 - 0.9 AI <0.2  QUANTIFERON-TB GOLD PLUS  Rpt  A.Fumigatus #1 Abs Negative  Negative  Micropolyspora faeni, IgG Negative  Negative  Thermoactinomyces vulgaris, IgG Negative  Negative  A. Pullulans Abs Negative  Negative  Thermoact. Saccharii Negative  Negative  Pigeon Serum Abs Negative  Negative  Candida Albicans IgE Class II kU/L 0.56 !  Setomelanomma Rostrat Class IV kU/L 10.40 !  Aureobasidi Pullulans IgE Class III kU/L 2.74 !  Phoma Betae IgE Class IV kU/L 9.07 !  Mitogen-NIL IU/mL >10.00  Cow Dander IgE Class 0 kU/L <0.10  NIL IU/mL 0.02  TB1-NIL IU/mL 0.03  TB2-NIL IU/mL 0.01  !: Data is abnormal (L): Data is abnormally low (H): Data is abnormally high Rpt: View report in Results  Review for more information  Latest Reference Range & Units 06/23/23 12:15  ANA Titer 1  Negative  ANCA SCREEN Negative  Negative  Atypical pANCA Neg:<1:20 titer <1:20  Cyclic Citrullin Peptide Ab UNITS <16  dsDNA Ab 0 - 9 IU/mL <1  ENA RNP Ab 0.0 - 0.9 AI 0.2  ENA SSA (RO) Ab 0.0 - 0.9 AI <0.2  ENA SSB (LA) Ab 0.0 - 0.9 AI <0.2  Serine Protease 3 <1.0 AI <1.0  RA Latex Turbid. <14 IU/mL <10  Cytoplasmic (C-ANCA) Neg:<1:20 titer <1:20  P-ANCA Neg:<1:20 titer <1:20  ENA SM Ab Ser-aCnc 0.0 - 0.9 AI <0.2  Anti-MPO Antibodies 0.0 - 0.9 units <0.2  Anti-PR3 Antibodies 0.0 - 0.9 units <0.2  Scleroderma (Scl-70) (ENA) Antibody, IgG 0.0 - 0.9 AI <0.2    LAB RESULTS last 96 hours No results found.  LAB RESULTS last 90 days Recent Results (from the past 2160 hour(s))  Pulmonary function test     Status: None (Preliminary result)   Collection Time: 09/23/23  9:59 AM  Result Value Ref Range   FVC-Pre 2.80 L   FVC-%Pred-Pre 53 %   FEV1-Pre 1.97 L   FEV1-%Pred-Pre 50 %   FEV6-Pre 2.80 L   FEV6-%Pred-Pre 56 %   Pre FEV1/FVC ratio 70 %   FEV1FVC-%Pred-Pre 94 %   Pre FEV6/FVC Ratio 100 %   FEV6FVC-%Pred-Pre 105 %   FEF 25-75 Pre 1.32 L/sec   FEF2575-%Pred-Pre 43 %   DLCO unc 18.09 ml/min/mmHg   DLCO unc % pred 60 %   DLCO cor 18.09 ml/min/mmHg   DLCO cor % pred 60 %   DL/VA 4.69 ml/min/mmHg/L   DL/VA % pred 83 %         has a past medical history of Asthma and Environmental allergies.   reports that he has never smoked. His smokeless tobacco use includes chew.  Past Surgical History:  Procedure Laterality Date   CHOLECYSTECTOMY     SPINE SURGERY     TONSILLECTOMY      No Known Allergies   There is no immunization history on file for this patient.  Family History  Problem Relation Age of Onset   Asthma Mother    Heart disease Father    Stroke Father      Current Outpatient Medications:    albuterol (PROVENTIL) (2.5 MG/3ML) 0.083% nebulizer solution, Take 3  mLs (2.5 mg total) by nebulization every 6 (six) hours as needed for wheezing or shortness of breath., Disp: 120 mL, Rfl: 5   albuterol (VENTOLIN HFA) 108 (90 Base) MCG/ACT inhaler, TAKE 2 PUFFS BY MOUTH EVERY 6 HOURS AS NEEDED FOR WHEEZE OR SHORTNESS OF BREATH, Disp: 8.5 each, Rfl: 2   Albuterol-Budesonide (AIRSUPRA) 90-80 MCG/ACT AERO, Inhale 2 puffs into the lungs every 4 (four) hours as needed (shortness of breath or wheezing). Not to exceed 12 puffs in 24 hours, Disp: 10.7 g, Rfl: 5   ARNUITY ELLIPTA 100 MCG/ACT AEPB, TAKE 1 PUFF BY MOUTH EVERY DAY, Disp: 30 each, Rfl: 5   aspirin 81 MG chewable tablet, Chew 81 mg by mouth daily., Disp: , Rfl:    atorvastatin (LIPITOR) 20 MG tablet, Take 1 tablet (20 mg total) by mouth daily., Disp: 30 tablet, Rfl: 0   Budeson-Glycopyrrol-Formoterol (BREZTRI AEROSPHERE) 160-9-4.8 MCG/ACT AERO, Inhale 2 puffs into the lungs in the morning and at bedtime., Disp: 10.7 g, Rfl: 11   Omega-3 Fatty Acids (FISH OIL PO), Take 1 tablet by mouth daily., Disp: , Rfl:    omeprazole (PRILOSEC) 20 MG capsule, TAKE 1 CAPSULE(20 MG) BY  MOUTH DAILY, Disp: 30 capsule, Rfl: 0   Spacer/Aero-Holding Chambers (AEROCHAMBER MV) inhaler, Use as instructed (Patient taking differently: 1 each by Other route as directed. Use as instructed), Disp: 1 each, Rfl: 0   triamcinolone cream (KENALOG) 0.1 %, APPLY TO AFFECTED AREA TWICE A DAY, Disp: 30 g, Rfl: 0   VITAMIN D PO, Take 1 tablet by mouth daily. Unknown strength, Disp: , Rfl:       Objective:   Vitals:   10/19/23 1411  BP: 128/72  Pulse: 93  SpO2: 99%  Weight: 242 lb (109.8 kg)  Height: 6\' 2"  (1.88 m)    Estimated body mass index is 31.07 kg/m as calculated from the following:   Height as of this encounter: 6\' 2"  (1.88 m).   Weight as of this encounter: 242 lb (109.8 kg).  @WEIGHTCHANGE @  [employer]   10/19/23 1411  Weight: 242 lb (109.8 kg)     Physical Exam   General: No distress. obese O2 at rest: no Cane  present: no Sitting in wheel chair: no Frail: no Obese: yes Neuro: Alert and Oriented x 3. GCS 15. Speech normal Psych: Pleasant Resp:  Barrel Chest - no.  Wheeze - no, Crackles - YES BASE, No overt respiratory distress CVS: Normal heart sounds. Murmurs - no Ext: Stigmata of Connective Tissue Disease - no HEENT: Normal upper airway. PEERL +. No post nasal drip        Assessment:       ICD-10-CM   1. ILD (interstitial lung disease) (HCC)  J84.9     2. DOE (dyspnea on exertion)  R06.09     3. Chronic cough  R05.3     4. Seasonal allergies  J30.2     5. House dust mite allergy  Z91.09     6. Moderate persistent extrinsic asthma without complication  J45.40          Plan:     Patient Instructions     ICD-10-CM   1. ILD (interstitial lung disease) (HCC)  J84.9     2. DOE (dyspnea on exertion)  R06.09     3. Chronic cough  R05.3     4. Seasonal allergies  J30.2     5. House dust mite allergy  Z91.09     6. Moderate persistent extrinsic asthma without complication  J45.40          - I am concerned you  have Interstitial Lung Disease (ILD)  - ABSENT in 2011 and seen 2023 -> 2024 but without change   - cause not kwnon deespite conference discussion and blood work   - can be NSIP, v HP v IPF  - you also have allergic asthma with dust mite, cat, dog, and other allergies  Plan -#ILD  - refer DR Dorris Fetch or [place] for surgical lung biopsy   - shared decision making to not do bronchoscopy method  #Allergies  - refer Allergies - ideally one in [place]  - continue breztri  - respect no flu shot  Follouwp  -10 weeks; to discuss results   FOLLOWUP Return in about 10 weeks (around 12/28/2023) for ILD, 15 min visit, with Dr Marchelle Gearing.    SIGNATURE    Dr. Kalman Shan, M.D., F.C.C.P,  Pulmonary and Critical Care Medicine Staff Physician, [facility] Director - Interstitial Lung Disease  Program  Pulmonary Fibrosis [facility] [place], [place], [address]  Pager: [phone], If no answer  or between  15:00h - 7:00h: call [phone] Telephone: [phone]  2:46 PM 10/19/2023  HIGh Complexity  OFFICE   2021 E/M guidelines, first released in 2021, with minor revisions added in 2023. Must meet the requirements for 2 out of 3 dimensions to qualify.    Number and complexity of problems addressed Amount and/or complexity of data reviewed Risk of complications and/or morbidity  Severe exacerbation of chronic illness  Acute or chronic illnesses that may pose a threat to life or bodily function, e.g., multiple trauma, acute MI, pulmonary embolus, severe respiratory distress, progressive rheumatoid arthritis, psychiatric illness with potential threat to self or others, peritonitis, acute renal failure, abrupt change in neurological status Must meet the requirements for 2 of 3 of the categories)  Category 1: Tests and documents, historian  Any combination of 3 of the following:  Assessment requiring an independent historian  Review of prior external note(s) from each unique source  Review of results of each unique test  Ordering of each unique test    Category 2: Interpretation of tests    Independent interpretation of a test performed by another physician/other qualified health care professional (not separately reported)  _CT  Category 3: Discuss management/tests  Discussion of management or test interpretation with external physician/other qualified health care professional/appropriate source (not separately reported)  HIGH risk of morbidity from additional diagnostic testing or treatment Examples only:  Drug therapy requiring intensive monitoring for toxicity  Decision for elective major surgery with identified pateint or procedure risk factors- surgical lung bx  Decision regarding hospitalization or escalation of level of care  Decision for DNR or to  de-escalate care   Parenteral controlled  substances

## 2023-10-19 NOTE — Addendum Note (Signed)
Addended by: Shelby Dubin on: 10/19/2023 02:52 PM   Modules accepted: Orders

## 2023-10-19 NOTE — Progress Notes (Signed)
Patient declined flu vaccine in office today.

## 2023-10-20 ENCOUNTER — Encounter: Payer: Self-pay | Admitting: Pharmacist

## 2023-10-20 ENCOUNTER — Ambulatory Visit (INDEPENDENT_AMBULATORY_CARE_PROVIDER_SITE_OTHER): Payer: 59

## 2023-10-20 VITALS — Ht 74.0 in | Wt 242.0 lb

## 2023-10-20 DIAGNOSIS — Z0000 Encounter for general adult medical examination without abnormal findings: Secondary | ICD-10-CM | POA: Diagnosis not present

## 2023-10-20 NOTE — Progress Notes (Signed)
Pharmacy Quality Measure Review  This patient is appearing on a report for being at risk of failing the adherence measure for cholesterol (statin) medications this calendar year.   Medication: atorvastatin 20mg  Last fill date: 07/21/2023 for 30 day supply  Patient has appointment with PCP 10/23/2023 but will not have meds to last until that appointment.   Will collaborate with provider to facilitate refill needs.  Henrene Pastor, PharmD Clinical Pharmacist [facility]  Population Health [phone]

## 2023-10-20 NOTE — Progress Notes (Signed)
Subjective:   Mark Doyle is a 67 y.o. male who presents for Medicare Annual/Subsequent preventive examination.  Visit Complete: Virtual I connected with  Keturah Barre on 10/20/23 by a audio enabled telemedicine application and verified that I am speaking with the correct person using two identifiers.  Patient Location: Home  Provider Location: Home Office  I discussed the limitations of evaluation and management by telemedicine. The patient expressed understanding and agreed to proceed.  Vital Signs: Because this visit was a virtual/telehealth visit, some criteria may be missing or patient reported. Any vitals not documented were not able to be obtained and vitals that have been documented are patient reported.   Cardiac Risk Factors include: advanced age (>33men, >79 women);male gender;smoking/ tobacco exposure     Objective:    Today's Vitals   10/20/23 1500  Weight: 242 lb (109.8 kg)  Height: 6\' 2"  (1.88 m)   Body mass index is 31.07 kg/m.     10/20/2023    3:10 PM 10/15/2022    2:21 PM 09/23/2021   10:35 AM  Advanced Directives  Does Patient Have a Medical Advance Directive? No No No  Would patient like information on creating a medical advance directive? No - Patient declined No - Patient declined No - Patient declined    Current Medications (verified) Outpatient Encounter Medications as of 10/20/2023  Medication Sig   albuterol (PROVENTIL) (2.5 MG/3ML) 0.083% nebulizer solution Take 3 mLs (2.5 mg total) by nebulization every 6 (six) hours as needed for wheezing or shortness of breath.   albuterol (VENTOLIN HFA) 108 (90 Base) MCG/ACT inhaler TAKE 2 PUFFS BY MOUTH EVERY 6 HOURS AS NEEDED FOR WHEEZE OR SHORTNESS OF BREATH   Albuterol-Budesonide (AIRSUPRA) 90-80 MCG/ACT AERO Inhale 2 puffs into the lungs every 4 (four) hours as needed (shortness of breath or wheezing). Not to exceed 12 puffs in 24 hours   ARNUITY ELLIPTA 100 MCG/ACT AEPB TAKE 1 PUFF BY MOUTH  EVERY DAY   aspirin 81 MG chewable tablet Chew 81 mg by mouth daily.   atorvastatin (LIPITOR) 20 MG tablet Take 1 tablet (20 mg total) by mouth daily.   Budeson-Glycopyrrol-Formoterol (BREZTRI AEROSPHERE) 160-9-4.8 MCG/ACT AERO Inhale 2 puffs into the lungs in the morning and at bedtime.   Omega-3 Fatty Acids (FISH OIL PO) Take 1 tablet by mouth daily.   omeprazole (PRILOSEC) 20 MG capsule TAKE 1 CAPSULE(20 MG) BY MOUTH DAILY   Spacer/Aero-Holding Chambers (AEROCHAMBER MV) inhaler Use as instructed (Patient taking differently: 1 each by Other route as directed. Use as instructed)   triamcinolone cream (KENALOG) 0.1 % APPLY TO AFFECTED AREA TWICE A DAY   VITAMIN D PO Take 1 tablet by mouth daily. Unknown strength   No facility-administered encounter medications on file as of 10/20/2023.    Allergies (verified) Patient has no known allergies.   History: Past Medical History:  Diagnosis Date   Asthma    Environmental allergies    Past Surgical History:  Procedure Laterality Date   CHOLECYSTECTOMY     SPINE SURGERY     TONSILLECTOMY     Family History  Problem Relation Age of Onset   Asthma Mother    Heart disease Father    Stroke Father    Social History   Socioeconomic History   Marital status: Married    Spouse name: Not on file   Number of children: Not on file   Years of education: Not on file   Highest education level: Not on file  Occupational History   Not on file  Tobacco Use   Smoking status: Never   Smokeless tobacco: Current    Types: Chew  Vaping Use   Vaping status: Never Used  Substance and Sexual Activity   Alcohol use: Yes    Alcohol/week: 12.0 standard drinks of alcohol    Types: 12 Cans of beer per week    Comment: occasionally   Drug use: Not on file   Sexual activity: Not Currently  Other Topics Concern   Not on file  Social History Narrative   Not on file   Social Determinants of Health   Financial Resource Strain: Low Risk   (10/20/2023)   Overall Financial Resource Strain (CARDIA)    Difficulty of Paying Living Expenses: Not hard at all  Food Insecurity: No Food Insecurity (10/20/2023)   Hunger Vital Sign    Worried About Running Out of Food in the Last Year: Never true    Ran Out of Food in the Last Year: Never true  Transportation Needs: No Transportation Needs (10/20/2023)   PRAPARE - Administrator, Civil Service (Medical): No    Lack of Transportation (Non-Medical): No  Physical Activity: Inactive (10/20/2023)   Exercise Vital Sign    Days of Exercise per Week: 0 days    Minutes of Exercise per Session: 0 min  Stress: No Stress Concern Present (10/20/2023)   [employer] of Occupational Health - Occupational Stress Questionnaire    Feeling of Stress : Not at all  Social Connections: Moderately Isolated (10/20/2023)   Social Connection and Isolation Panel [NHANES]    Frequency of Communication with Friends and Family: More than three times a week    Frequency of Social Gatherings with Friends and Family: More than three times a week    Attends Religious Services: Never    Database administrator or Organizations: No    Attends Engineer, structural: Never    Marital Status: Married    Tobacco Counseling Ready to quit: No Counseling given: Yes   Clinical Intake:  Pre-visit preparation completed: Yes  Pain : No/denies pain     BMI - recorded: 31.07 Nutritional Status: BMI > 30  Obese Nutritional Risks: None Diabetes: No  How often do you need to have someone help you when you read instructions, pamphlets, or other written materials from your doctor or pharmacy?: 1 - Never  Interpreter Needed?: No  Information entered by :: Theresa Mulligan LPN   Activities of Daily Living    10/20/2023    3:08 PM  In your present state of health, do you have any difficulty performing the following activities:  Hearing? 0  Vision? 0  Difficulty concentrating or making  decisions? 0  Walking or climbing stairs? 0  Dressing or bathing? 0  Doing errands, shopping? 0  Preparing Food and eating ? N  Using the Toilet? N  In the past six months, have you accidently leaked urine? N  Do you have problems with loss of bowel control? N  Managing your Medications? N  Managing your Finances? N  Housekeeping or managing your Housekeeping? N    Patient Care Team: Olive Bass, FNP as PCP - General (Internal Medicine)  Indicate any recent Medical Services you may have received from other than [facility] providers in the past year (date may be approximate).     Assessment:   This is a routine wellness examination for [place].  Hearing/Vision screen Hearing Screening - Comments:: Denies  hearing difficulties   Vision Screening - Comments:: Wears rx glasses - up to date with routine eye exams with  [facility]   Goals Addressed               This Visit's Progress     Patient Stated (pt-stated)        Continue to work on breathing issues.       Depression Screen    10/20/2023    3:07 PM 10/17/2022    9:29 AM 10/15/2022    2:24 PM 09/23/2021   10:37 AM 07/09/2021   10:43 AM  PHQ 2/9 Scores  PHQ - 2 Score 0 0 0 0 0    Fall Risk    10/20/2023    3:08 PM 10/17/2022    9:29 AM 10/15/2022    2:22 PM 09/23/2021   10:36 AM 07/09/2021   10:43 AM  Fall Risk   Falls in the past year? 0 1 1 1 1   Number falls in past yr: 0 1 0 0 1  Injury with Fall? 0 1 0 0 0  Risk for fall due to : No Fall Risks History of fall(s);Orthopedic patient;Impaired balance/gait History of fall(s) History of fall(s) Impaired balance/gait  Follow up Falls prevention discussed Falls evaluation completed;Falls prevention discussed Falls evaluation completed Falls prevention discussed Falls evaluation completed    MEDICARE RISK AT HOME: Medicare Risk at Home Any stairs in or around the home?: No If so, are there any without handrails?: No Home free of loose throw rugs in  walkways, pet beds, electrical cords, etc?: Yes Adequate lighting in your home to reduce risk of falls?: Yes Life alert?: No Use of a cane, walker or w/c?: No Grab bars in the bathroom?: No Shower chair or bench in shower?: Yes Elevated toilet seat or a handicapped toilet?: No  TIMED UP AND GO:  Was the test performed?  No    Cognitive Function:        10/20/2023    3:11 PM 10/15/2022    2:34 PM  6CIT Screen  What Year? 0 points 0 points  What month? 0 points 0 points  What time? 0 points 0 points  Count back from 20 0 points 0 points  Months in reverse 0 points 2 points  Repeat phrase 0 points 6 points  Total Score 0 points 8 points    Immunizations  There is no immunization history on file for this patient.  TDAP status: Due, Education has been provided regarding the importance of this vaccine. Advised may receive this vaccine at local pharmacy or Health Dept. Aware to provide a copy of the vaccination record if obtained from local pharmacy or Health Dept. Verbalized acceptance and understanding.  Flu Vaccine status: Due, Education has been provided regarding the importance of this vaccine. Advised may receive this vaccine at local pharmacy or Health Dept. Aware to provide a copy of the vaccination record if obtained from local pharmacy or Health Dept. Verbalized acceptance and understanding.  Pneumococcal vaccine status: Due, Education has been provided regarding the importance of this vaccine. Advised may receive this vaccine at local pharmacy or Health Dept. Aware to provide a copy of the vaccination record if obtained from local pharmacy or Health Dept. Verbalized acceptance and understanding.  Covid-19 vaccine status: Declined, Education has been provided regarding the importance of this vaccine but patient still declined. Advised may receive this vaccine at local pharmacy or Health Dept.or vaccine clinic. Aware to provide a copy of  the vaccination record if obtained  from local pharmacy or Health Dept. Verbalized acceptance and understanding.    Screening Tests Health Maintenance  Topic Date Due   Hepatitis C Screening  Never done   DTaP/Tdap/Td (1 - Tdap) Never done   Colonoscopy  Never done   Pneumonia Vaccine 70+ Years old (1 of 1 - PCV) Never done   INFLUENZA VACCINE  Never done   COVID-19 Vaccine (1 - 2023-24 season) Never done   Medicare Annual Wellness (AWV)  10/19/2024   HPV VACCINES  Aged Out   Zoster Vaccines- Shingrix  Discontinued    Health Maintenance  Health Maintenance Due  Topic Date Due   Hepatitis C Screening  Never done   DTaP/Tdap/Td (1 - Tdap) Never done   Colonoscopy  Never done   Pneumonia Vaccine 70+ Years old (1 of 1 - PCV) Never done   INFLUENZA VACCINE  Never done   COVID-19 Vaccine (1 - 2023-24 season) Never done    Colorectal cancer screening: Referral to GI placed Deferred. Pt aware the office will call re: appt.  Lung Cancer Screening: (Low Dose CT Chest recommended if Age 37-80 years, 20 pack-year currently smoking OR have quit w/in 15years.) does qualify.   Lung Cancer Screening Referral: Deferred  Additional Screening:  Hepatitis C Screening: does qualify; Deferred  Vision Screening: Recommended annual ophthalmology exams for early detection of glaucoma and other disorders of the eye. Is the patient up to date with their annual eye exam?  Yes  Who is the provider or what is the name of the office in which the patient attends annual eye exams? [facility] If pt is not established with a provider, would they like to be referred to a provider to establish care? No .   Dental Screening: Recommended annual dental exams for proper oral hygiene    Community Resource Referral / Chronic Care Management:  CRR required this visit?  No   CCM required this visit?  No     Plan:     I have personally reviewed and noted the following in the patient's chart:   Medical and social history Use of  alcohol, tobacco or illicit drugs  Current medications and supplements including opioid prescriptions. Patient is not currently taking opioid prescriptions. Functional ability and status Nutritional status Physical activity Advanced directives List of other physicians Hospitalizations, surgeries, and ER visits in previous 12 months Vitals Screenings to include cognitive, depression, and falls Referrals and appointments  In addition, I have reviewed and discussed with patient certain preventive protocols, quality metrics, and best practice recommendations. A written personalized care plan for preventive services as well as general preventive health recommendations were provided to patient.     Tillie Rung, LPN   24/40/1027   After Visit Summary: (MyChart) Due to this being a telephonic visit, the after visit summary with patients personalized plan was offered to patient via MyChart   Nurse Notes: None

## 2023-10-20 NOTE — Patient Instructions (Addendum)
Mark Doyle , Thank you for taking time to come for your Medicare Wellness Visit. I appreciate your ongoing commitment to your health goals. Please review the following plan we discussed and let me know if I can assist you in the future.   Referrals/Orders/Follow-Ups/Clinician Recommendations:   This is a list of the screening recommended for you and due dates:  Health Maintenance  Topic Date Due   Hepatitis C Screening  Never done   DTaP/Tdap/Td vaccine (1 - Tdap) Never done   Colon Cancer Screening  Never done   Pneumonia Vaccine (1 of 1 - PCV) Never done   Flu Shot  Never done   COVID-19 Vaccine (1 - 2023-24 season) Never done   Medicare Annual Wellness Visit  10/19/2024   HPV Vaccine  Aged Out   Zoster (Shingles) Vaccine  Discontinued    Advanced directives: (Declined) Advance directive discussed with you today. Even though you declined this today, please call our office should you change your mind, and we can give you the proper paperwork for you to fill out.  Next Medicare Annual Wellness Visit scheduled for next year: Yes

## 2023-10-23 ENCOUNTER — Ambulatory Visit: Payer: 59 | Admitting: Family

## 2023-10-27 ENCOUNTER — Encounter: Payer: Self-pay | Admitting: Family

## 2023-10-27 ENCOUNTER — Ambulatory Visit (INDEPENDENT_AMBULATORY_CARE_PROVIDER_SITE_OTHER): Payer: 59 | Admitting: Family

## 2023-10-27 VITALS — BP 142/94 | HR 94 | Resp 18 | Ht 74.0 in | Wt 244.8 lb

## 2023-10-27 DIAGNOSIS — Z125 Encounter for screening for malignant neoplasm of prostate: Secondary | ICD-10-CM | POA: Diagnosis not present

## 2023-10-27 DIAGNOSIS — R7309 Other abnormal glucose: Secondary | ICD-10-CM

## 2023-10-27 DIAGNOSIS — Z1211 Encounter for screening for malignant neoplasm of colon: Secondary | ICD-10-CM

## 2023-10-27 DIAGNOSIS — Z9109 Other allergy status, other than to drugs and biological substances: Secondary | ICD-10-CM | POA: Diagnosis not present

## 2023-10-27 DIAGNOSIS — R0609 Other forms of dyspnea: Secondary | ICD-10-CM | POA: Diagnosis not present

## 2023-10-27 DIAGNOSIS — Z13220 Encounter for screening for lipoid disorders: Secondary | ICD-10-CM

## 2023-10-27 LAB — HEMOGLOBIN A1C: Hgb A1c MFr Bld: 5.3 % (ref 4.6–6.5)

## 2023-10-27 LAB — PSA: PSA: 1.18 ng/mL (ref 0.10–4.00)

## 2023-10-27 LAB — LIPID PANEL
Cholesterol: 163 mg/dL (ref 0–200)
HDL: 75.4 mg/dL (ref >39.00)
LDL Cholesterol: 76 mg/dL (ref 0–99)
NonHDL: 87.25
Total CHOL/HDL Ratio: 2
Triglycerides: 57 mg/dL (ref 0.0–149.0)
VLDL: 11.4 mg/dL (ref 0.0–40.0)

## 2023-10-27 NOTE — Progress Notes (Signed)
Mark Doyle is a 67 y.o. male with the following history as recorded in EpicCare:  Patient Active Problem List   Diagnosis Date Noted   Cortical age-related cataract of both eyes 03/09/2023   Early dry stage nonexudative age-related macular degeneration of both eyes 03/09/2023   Myopia of both eyes with astigmatism and presbyopia 03/09/2023   Nuclear sclerotic cataract of both eyes 03/09/2023   ILD (interstitial lung disease) (HCC) 02/23/2023   Obstructive lung disease (HCC) 11/25/2022   Coronary artery calcification 11/25/2022   Dyspnea on exertion 08/02/2021   Environmental allergies 08/01/2021   Asthma 08/01/2021   Elevated LFTs 11/08/2019   Mild intermittent asthma without complication 11/08/2019   Pure hypercholesterolemia 11/08/2019    Current Outpatient Medications  Medication Sig Dispense Refill   albuterol (PROVENTIL) (2.5 MG/3ML) 0.083% nebulizer solution Take 3 mLs (2.5 mg total) by nebulization every 6 (six) hours as needed for wheezing or shortness of breath. 120 mL 5   albuterol (VENTOLIN HFA) 108 (90 Base) MCG/ACT inhaler TAKE 2 PUFFS BY MOUTH EVERY 6 HOURS AS NEEDED FOR WHEEZE OR SHORTNESS OF BREATH 8.5 each 2   Albuterol-Budesonide (AIRSUPRA) 90-80 MCG/ACT AERO Inhale 2 puffs into the lungs every 4 (four) hours as needed (shortness of breath or wheezing). Not to exceed 12 puffs in 24 hours 10.7 g 5   ARNUITY ELLIPTA 100 MCG/ACT AEPB TAKE 1 PUFF BY MOUTH EVERY DAY 30 each 5   aspirin 81 MG chewable tablet Chew 81 mg by mouth daily.     atorvastatin (LIPITOR) 20 MG tablet Take 1 tablet (20 mg total) by mouth daily. 30 tablet 0   Budeson-Glycopyrrol-Formoterol (BREZTRI AEROSPHERE) 160-9-4.8 MCG/ACT AERO Inhale 2 puffs into the lungs in the morning and at bedtime. 10.7 g 11   Omega-3 Fatty Acids (FISH OIL PO) Take 1 tablet by mouth daily.     omeprazole (PRILOSEC) 20 MG capsule TAKE 1 CAPSULE(20 MG) BY MOUTH DAILY 30 capsule 0   Spacer/Aero-Holding Chambers (AEROCHAMBER  MV) inhaler Use as instructed (Patient taking differently: 1 each by Other route as directed. Use as instructed) 1 each 0   triamcinolone cream (KENALOG) 0.1 % APPLY TO AFFECTED AREA TWICE A DAY 30 g 0   VITAMIN D PO Take 1 tablet by mouth daily. Unknown strength     No current facility-administered medications for this visit.    Allergies: Patient has no known allergies.  Past Medical History:  Diagnosis Date   Asthma    Environmental allergies     Past Surgical History:  Procedure Laterality Date   CHOLECYSTECTOMY     SPINE SURGERY     TONSILLECTOMY      Family History  Problem Relation Age of Onset   Asthma Mother    Heart disease Father    Stroke Father     Social History   Tobacco Use   Smoking status: Never   Smokeless tobacco: Current    Types: Chew  Substance Use Topics   Alcohol use: Yes    Alcohol/week: 12.0 standard drinks of alcohol    Types: 12 Cans of beer per week    Comment: occasionally    Subjective:   Requesting order to be updated for Cologuard; does have occasional constipation but no changes in bowel movements/ no bleeding;  Continuing to struggle with his breathing- will be seeing cardiothoracic surgeon to discuss biopsy;  Objective:  Vitals:   10/27/23 1039  BP: (!) 142/94  Pulse: 94  Resp: 18  SpO2: 95%  Weight: 244 lb 12.8 oz (111 kg)  Height: 6\' 2"  (1.88 m)    General: Well developed, well nourished, in no acute distress  Skin : Warm and dry.  Head: Normocephalic and atraumatic  Lungs: Respirations unlabored; clear to auscultation bilaterally without wheeze, rales, rhonchi  [facility] exam: normal rate and regular rhythm.  Neurologic: Alert and oriented; speech intact; face symmetrical; moves all extremities well; CNII-XII intact without focal deficit   Assessment:  1. Colon cancer screening   2. Environmental allergies   3. DOE (dyspnea on exertion)   4. Prostate cancer screening   5. Lipid screening   6. Elevated glucose      Plan:  Cologuard order updated; Patient wants to see allergist in [facility]- referral updated; Refer back to cardiology- was due for follow up earlier this year; discussed with patient that his heart could be affecting his lungs and ability to breathe; Check PSA; Check lipid panel; Check Hgba1c;   No follow-ups on file.  Orders Placed This Encounter  Procedures   Cologuard   PSA   Lipid panel   Hemoglobin A1c   Ambulatory referral to Allergy    Referral Priority:   Routine    Referral Type:   Allergy Testing    Referral Reason:   Specialty Services Required    Requested Specialty:   Allergy    Number of Visits Requested:   1   Ambulatory referral to Cardiology    Referral Priority:   Routine    Referral Type:   Consultation    Referral Reason:   Specialty Services Required    Referred to Provider:   Georgeanna Lea, MD    Number of Visits Requested:   1    Requested Prescriptions    No prescriptions requested or ordered in this encounter

## 2023-11-05 ENCOUNTER — Telehealth: Payer: Self-pay | Admitting: Family

## 2023-11-05 NOTE — Telephone Encounter (Signed)
Pt would like a nurse to go over his labs from 11/5.

## 2023-11-06 NOTE — Telephone Encounter (Signed)
Spoke with pt, pt is aware of his results and expressed understanding.

## 2023-11-08 DIAGNOSIS — Z1211 Encounter for screening for malignant neoplasm of colon: Secondary | ICD-10-CM | POA: Diagnosis not present

## 2023-11-09 ENCOUNTER — Encounter: Payer: 59 | Admitting: Thoracic Surgery (Cardiothoracic Vascular Surgery)

## 2023-11-18 LAB — COLOGUARD: COLOGUARD: NEGATIVE

## 2023-11-20 ENCOUNTER — Other Ambulatory Visit: Payer: Self-pay | Admitting: Family

## 2023-11-24 ENCOUNTER — Encounter: Payer: 59 | Admitting: Thoracic Surgery (Cardiothoracic Vascular Surgery)

## 2023-11-26 ENCOUNTER — Other Ambulatory Visit: Payer: Self-pay | Admitting: Pulmonary Disease

## 2023-12-22 ENCOUNTER — Telehealth: Payer: Self-pay | Admitting: Internal Medicine

## 2023-12-22 NOTE — Telephone Encounter (Signed)
 Mark Doyle  Is ILD. NEver followed up after sumemr 2024 visti and insturction. Please ensure 30 min followup     SIGNATURE    Dr. Dorethia Cave, M.D., F.C.C.P,  Pulmonary and Critical Care Medicine Staff Physician, [facility] Director - Interstitial Lung Disease  Program  Pulmonary Fibrosis [facility] at [facility] [place], [place], [address]   Pager: [phone], If no answer  -> Check AMION or Try [phone] Telephone (clinical office): [phone] Telephone (research): [phone]  7:32 PM 12/22/2023

## 2023-12-23 ENCOUNTER — Other Ambulatory Visit: Payer: Self-pay | Admitting: Pulmonary Disease

## 2023-12-24 ENCOUNTER — Other Ambulatory Visit: Payer: Self-pay | Admitting: Family

## 2023-12-24 ENCOUNTER — Telehealth: Payer: Self-pay | Admitting: Internal Medicine

## 2023-12-24 NOTE — Telephone Encounter (Signed)
 Patient scheduled 01/12/2024 with Dr. Marchelle Gearing,

## 2023-12-24 NOTE — Telephone Encounter (Signed)
 PT needs more Al[facility]erol  [facility] [place].

## 2023-12-24 NOTE — Progress Notes (Signed)
 NEW PATIENT Date of Service/Encounter:  12/25/23 Referring provider: Jason Leita Repine,* Primary care provider: Jason Leita Repine, FNP  Subjective:  Mark Doyle is a 68 y.o. male with a PMHx of cataracts, macular degeneration, ILD, hypertension, dyspnea on exertion hypercholesterolemia, mild intermittent asthma presenting today for evaluation of environmental allergies and respiratory issues. History obtained from: chart review and patient.   Discussed the use of AI scribe software for clinical note transcription with the patient, who gave verbal consent to proceed.  History of Present Illness   The patient, with a history of asthma, interstitial lung disease, and allergies, was referred for allergy consultation due to persistent respiratory issues. Despite being on Breztri  inhaler (two puffs twice a day), the patient reports a significant decline in his respiratory function over the past three years, which he describes as a 'quick' progression. He experiences shortness of breath with minimal exertion, such as walking to the mailbox, and has had to limit his physical activities significantly due to this.  The patient has a lifelong history of asthma, which required frequent hospitalizations during his childhood, but has been stable in adulthood with no recent hospitalizations or need for oral steroids. He also has a lifelong history of allergies, including allergies to cats, dogs, indoor and outdoor molds, and possibly pollens. Despite these allergies, the patient does not currently take any allergy medications and reports only occasional sneezing and coughing up mucus as allergy symptoms. He has a history of tobacco chewing but no smoking history.  The patient also reports a recent incident of water damage in his home, which has since been addressed. He continues to engage in yard work, specifically mowing the lawn, despite his respiratory issues. He has two short-haired dogs at  home, which he reports do not exacerbate his symptoms.  The patient is scheduled to see a surgeon for a lung biopsy to further investigate his interstitial lung disease. He has not been on any injectable medicines for asthma, and has not received allergy shots in the past. He has not been on Singulair  or montelukast  for his asthma. The patient's primary concern at this time is his declining respiratory function and its impact on his daily activities.  He would not be interested in allergy injections, he prefers no nasal sprays, and he would consider an injectable medication fro asthma.       Chart Review:  Reviewed PCP notes from referral 10/27/23: Struggling with breathing, planning to see cardiothoracic surgeon to discuss biopsy. Follows with pulmonary, last seen 10/19/2023 for ILD.  Using Breztri  around 4 times daily for dyspnea with exertion.  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  Plan:  I am concerned you  have Interstitial Lung Disease (ILD)  - ABSENT in 2011 and seen 2023 -> 2024 but without change  - cause not kwnon deespite conference discussion and blood work                         - can be NSIP, v HP v IPF - you also have allergic asthma with dust mite, cat, dog, and other allergies -#ILD  - refer DR Kerrin or [place] for surgical lung biopsy                         - shared decision making to not do bronchoscopy method #Allergies  - refer Allergies - ideally one in [place]  - continue breztri   - respect no flu  shot  Previous imaging/studies Spirometry 09/23/23: R 70%, FEV1 50%, FVC 2. 8 L (53%)-airways obstruction with decreased diffusing capacity noted CT chest 05/28/23:  The appearance of the lungs is stable compared to the prior study, once again categorized as indeterminate for usual interstitial pneumonia (UIP) per current ATS guidelines. Repeat high-resolution chest CT should be considered in 12 months to assess for temporal changes in the appearance of  the lung parenchyma. 2. Mild air trapping indicative of small airways disease. 3. Mild tracheobronchomalacia. 4. Hepatic steatosis. 5. Aortic atherosclerosis, in addition to left main and three-vessel coronary artery disease. Please note that although the presence of coronary artery calcium  documents the presence of coronary artery disease, the severity of this disease and any potential stenosis cannot be assessed on this non-gated CT examination. Assessment for potential risk factor modification, dietary therapy or pharmacologic therapy may be warranted, if clinically indicated. 06/23/23: negative quantiferon gold, ESR elevated (28), normal CCP, negative HP panel, normal aldolase, CK, ANCA profile negative, negative ANA, ENA, DNA/DS, Scl 70, SjoSSA/B, environmental allergy panel (positive to cat dog, indoor and outdoor molds, borderline to dust mites, no pollens obtained)  2022 PFT: Mixed severe obstructive and restrictive lung defect is present. Significant bronchodilator response. Obstruction in the setting of air trapping and reduced DLCO suggestive of emphysema    Past Medical History: Past Medical History:  Diagnosis Date   Asthma    Environmental allergies    Medication List:  Current Outpatient Medications  Medication Sig Dispense Refill   albuterol  (PROVENTIL ) (2.5 MG/3ML) 0.083% nebulizer solution INHALE 3 ML BY NEBULIZATION EVERY 6 HOURS AS NEEDED FOR WHEEZING OR SHORTNESS OF BREATH 150 mL 5   albuterol  (VENTOLIN  HFA) 108 (90 Base) MCG/ACT inhaler TAKE 2 PUFFS BY MOUTH EVERY 6 HOURS AS NEEDED FOR WHEEZE OR SHORTNESS OF BREATH 8.5 each 2   ARNUITY ELLIPTA  100 MCG/ACT AEPB TAKE 1 PUFF BY MOUTH EVERY DAY 30 each 5   aspirin 81 MG chewable tablet Chew 81 mg by mouth daily.     atorvastatin  (LIPITOR) 20 MG tablet Take 1 tablet (20 mg total) by mouth daily. 30 tablet 0   Omega-3 Fatty Acids (FISH OIL PO) Take 1 tablet by mouth daily.     omeprazole  (PRILOSEC) 20 MG capsule Take  1 capsule (20 mg total) by mouth daily. 90 capsule 3   VITAMIN D PO Take 1 tablet by mouth daily. Unknown strength     Spacer/Aero-Holding Chambers (AEROCHAMBER MV) inhaler Use as instructed (Patient taking differently: 1 each by Other route as directed. Use as instructed) 1 each 0   triamcinolone  cream (KENALOG ) 0.1 % APPLY TO AFFECTED AREA TWICE A DAY (Patient not taking: Reported on 12/25/2023) 30 g 0   No current facility-administered medications for this visit.   Known Allergies:  No Known Allergies Past Surgical History: Past Surgical History:  Procedure Laterality Date   CHOLECYSTECTOMY     SPINE SURGERY     TONSILLECTOMY     Family History: Family History  Problem Relation Age of Onset   Asthma Mother    Heart disease Father    Stroke Father    Social History: Kirsten lives in a house built 60 years ago, no water damage, carpet in the home, gas heating, central AC, 2 short haired dogs, no roaches, not using dust mite covers on the bed of the pillows, no smoke exposure.  He is retired.  Was exposed to chemicals in his work in [place] .  Home is not near  interstate/industrial area, no HEPA filter in the home..   ROS:  All other systems negative except as noted per HPI.  Objective:  Blood pressure 120/80, pulse 97, temperature (!) 96.8 F (36 C), temperature source Temporal, resp. rate 18, weight 240 lb (108.9 kg), SpO2 95%. Body mass index is 30.81 kg/m. Physical Exam:  General Appearance:  Alert, cooperative, no distress, appears stated age  Head:  Normocephalic, without obvious abnormality, atraumatic  Eyes:  Conjunctiva clear, EOM's intact  Ears EACs normal bilaterally and normal TMs bilaterally  Nose: Nares normal, hypertrophic turbinates, normal mucosa, and no visible anterior polyps  Throat: Lips, tongue normal; teeth and gums normal, normal posterior oropharynx  Neck: Supple, symmetrical  Lungs:   Crackles in bases , Respirations unlabored, no coughing  Heart:   regular rate and rhythm and no murmur, Appears well perfused  Extremities: No edema  Skin: Skin color, texture, turgor normal and no rashes or lesions on visualized portions of skin  Neurologic: No gross deficits   Diagnostics: Spirometry:  Tracings reviewed. His effort: It was hard to get consistent efforts and there is a question as to whether this reflects a maximal maneuver. FVC: 2.09L (pre), 2.06L  (post) FEV1: 1.43L, 38% predicted (pre), 1.52L, 41% predicted (post), +6% FEV1/FVC ratio: 0.68 (pre), 0.71 (post) Interpretation: Nonobstructive ratio, low FEV1, possible restriction. Without significant improvement, suspect related to effort.  Please see scanned spirometry results for details.  Labs:  Lab Orders  No laboratory test(s) ordered today     Assessment and Plan  Assessment and Plan    Asthma, severe persistent, not well controlled- Longstanding history, currently on Breztri  inhaler (2 puffs twice daily). Patient reports significant shortness of breath with physical activity. No recent hospitalizations, ER visits, or oral steroid use. Discussed potential benefit of biologic therapy for asthma control. -Order additional blood work to assess for potential use of biologic therapy. -Consider starting Montelukast  10 mg daily for additional asthma control, with caution regarding potential side effects. -Continue Breztri  inhaler and other inhalers as prescribed by pulmonary.  Allergic Rhinitis Patient reports lifelong allergies, including to dogs, molds, and likely pollens. Currently not on any allergy medications. Patient reports sneezing and postnasal drainage. -Start Levocetirizine (Xyzal ) 5 mg for allergy symptoms daily. -Order additional allergy testing via blood work to identify specific allergic triggers. -Consider use of nasal spray if symptoms persist, despite patient's preference for oral medication. -Not a candidate currently for allergy injections due to poor lung  function and control, patient also not interested in allergy injections.  Interstitial Lung Disease Patient is under the care of a pulmonologist and is scheduled for a lung biopsy. Patient reports rapid decline in respiratory function. -Coordinate care with pulmonologist, particularly regarding potential use of biologic therapy for asthma in the context of interstitial lung disease.  Follow-up in 3 months to assess response to new medications and potential initiation of biologic therapy for asthma.   It was a pleasure meeting you in clinic today! Thank you for allowing me to participate in your care.  Rocky Endow, MD [facility]   This note in its entirety was forwarded to the Provider who requested this consultation.  Other: none  Thank you for your kind referral. I appreciate the opportunity to take part in Fidel's care. Please do not hesitate to contact me with questions.  Sincerely,  Rocky Endow, MD [facility]  

## 2023-12-25 ENCOUNTER — Encounter: Payer: Self-pay | Admitting: Internal Medicine

## 2023-12-25 ENCOUNTER — Ambulatory Visit (INDEPENDENT_AMBULATORY_CARE_PROVIDER_SITE_OTHER): Payer: 59 | Admitting: Internal Medicine

## 2023-12-25 VITALS — BP 120/80 | HR 97 | Temp 96.8°F | Resp 18 | Ht 74.0 in | Wt 240.0 lb

## 2023-12-25 DIAGNOSIS — J3089 Other allergic rhinitis: Secondary | ICD-10-CM

## 2023-12-25 DIAGNOSIS — I2510 Atherosclerotic heart disease of native coronary artery without angina pectoris: Secondary | ICD-10-CM

## 2023-12-25 DIAGNOSIS — J4550 Severe persistent asthma, uncomplicated: Secondary | ICD-10-CM | POA: Diagnosis not present

## 2023-12-25 DIAGNOSIS — J849 Interstitial pulmonary disease, unspecified: Secondary | ICD-10-CM

## 2023-12-25 DIAGNOSIS — J302 Other seasonal allergic rhinitis: Secondary | ICD-10-CM | POA: Diagnosis not present

## 2023-12-25 MED ORDER — ALBUTEROL SULFATE (2.5 MG/3ML) 0.083% IN NEBU: 2.5000 mg | INHALATION_SOLUTION | RESPIRATORY_TRACT | 5 refills | Status: AC | PRN

## 2023-12-25 MED ORDER — ALBUTEROL SULFATE HFA 108 (90 BASE) MCG/ACT IN AERS: 2.0000 | INHALATION_SPRAY | RESPIRATORY_TRACT | 2 refills | Status: DC | PRN

## 2023-12-25 MED ORDER — LEVOCETIRIZINE DIHYDROCHLORIDE 5 MG PO TABS: 5.0000 mg | ORAL_TABLET | Freq: Every evening | ORAL | 5 refills | Status: DC

## 2023-12-25 MED ORDER — MONTELUKAST SODIUM 10 MG PO TABS
10.0000 mg | ORAL_TABLET | Freq: Every day | ORAL | 5 refills | Status: DC
Start: 2023-12-25 — End: 2024-04-15

## 2023-12-25 NOTE — Patient Instructions (Addendum)
 Asthma Longstanding history, currently on Breztri  inhaler (2 puffs twice daily). Patient reports significant shortness of breath with physical activity. No recent hospitalizations, ER visits, or oral steroid use. Discussed potential benefit of biologic therapy for asthma control. -Order additional blood work to assess for potential use of biologic therapy. -Consider starting Montelukast  10 mg daily for additional asthma control, with caution regarding potential side effects. -Continue Breztri  inhaler and other inhalers as prescribed by pulmonary.  Allergic Rhinitis Patient reports lifelong allergies, including to dogs, molds, and likely pollens. Currently not on any allergy medications. Patient reports sneezing and postnasal drainage. -Start Levocetirizine (Xyzal ) 5 mg for allergy symptoms daily. -Order additional allergy testing via blood work to identify specific allergic triggers. -Consider use of nasal spray if symptoms persist, despite patient's preference for oral medication. -Not a candidate currently for allergy injections due to poor lung function and control, patient also not interested in allergy injections.  Interstitial Lung Disease Patient is under the care of a pulmonologist and is scheduled for a lung biopsy. Patient reports rapid decline in respiratory function. -Coordinate care with pulmonologist, particularly regarding potential use of biologic therapy for asthma in the context of interstitial lung disease.  Follow-up in 3 months to assess response to new medications and potential initiation of biologic therapy for asthma.   It was a pleasure meeting you in clinic today! Thank you for allowing me to participate in your care.  Rocky Endow, MD [facility]

## 2023-12-25 NOTE — Telephone Encounter (Signed)
 Called and spoke with pt about meds being sent into pharmacy, he verbalized understanding nfn

## 2023-12-25 NOTE — Telephone Encounter (Signed)
 Tried calling pt x1. Lvm asking for a call back

## 2023-12-27 LAB — ALLERGENS, ZONE 2
Alternaria Alternata IgE: 9.88 kU/L — AB
Amer Sycamore IgE Qn: 0.32 kU/L — AB
Aspergillus Fumigatus IgE: 2 kU/L — AB
Bahia Grass IgE: 0.6 kU/L — AB
Bermuda Grass IgE: 0.94 kU/L — AB
Cat Dander IgE: 1.12 kU/L — AB
Cedar, Mountain IgE: 0.21 kU/L — AB
Cladosporium Herbarum IgE: 2.69 kU/L — AB
Common Silver Birch IgE: 0.43 kU/L — AB
D Farinae IgE: 0.15 kU/L — AB
D Pteronyssinus IgE: 0.12 kU/L — AB
Dog Dander IgE: 0.79 kU/L — AB
Elm, American IgE: 0.68 kU/L — AB
Hickory, White IgE: 0.91 kU/L — AB
Johnson Grass IgE: 0.36 kU/L — AB
Maple/Box Elder IgE: 0.32 kU/L — AB
Mucor Racemosus IgE: 0.11 kU/L — AB
Mugwort IgE Qn: 0.48 kU/L — AB
Nettle IgE: 0.52 kU/L — AB
Oak, White IgE: 0.36 kU/L — AB
Penicillium Chrysogen IgE: 0.31 kU/L — AB
Pigweed, Rough IgE: 0.4 kU/L — AB
Plantain, English IgE: 0.22 kU/L — AB
Ragweed, Short IgE: 0.76 kU/L — AB
Sheep Sorrel IgE Qn: 0.29 kU/L — AB
Stemphylium Herbarum IgE: 11.6 kU/L — AB
Timothy Grass IgE: 0.46 kU/L — AB

## 2023-12-27 LAB — CBC WITH DIFFERENTIAL/PLATELET
Basophils Absolute: 0.1 10*3/uL (ref 0.0–0.2)
Basos: 2 %
EOS (ABSOLUTE): 0.4 10*3/uL (ref 0.0–0.4)
Eos: 6 %
Hematocrit: 47.1 % (ref 37.5–51.0)
Hemoglobin: 16.2 g/dL (ref 13.0–17.7)
Immature Grans (Abs): 0.1 10*3/uL (ref 0.0–0.1)
Immature Granulocytes: 1 %
Lymphocytes Absolute: 1.3 10*3/uL (ref 0.7–3.1)
Lymphs: 19 %
MCH: 33.8 pg — ABNORMAL HIGH (ref 26.6–33.0)
MCHC: 34.4 g/dL (ref 31.5–35.7)
MCV: 98 fL — ABNORMAL HIGH (ref 79–97)
Monocytes Absolute: 0.8 10*3/uL (ref 0.1–0.9)
Monocytes: 12 %
Neutrophils Absolute: 4.1 10*3/uL (ref 1.4–7.0)
Neutrophils: 60 %
Platelets: 325 10*3/uL (ref 150–450)
RBC: 4.8 x10E6/uL (ref 4.14–5.80)
RDW: 12.3 % (ref 11.6–15.4)
WBC: 6.8 10*3/uL (ref 3.4–10.8)

## 2023-12-27 LAB — IGE: IgE (Immunoglobulin E), Serum: 185 [IU]/mL (ref 6–495)

## 2023-12-28 NOTE — Progress Notes (Signed)
 Please let patient know that his environmental allergy panel does show allergens to grass pollens, tree pollens, and weed pollens in addition to the cat, dog, molds and dust mite that we had discussed previously.  He is a candidate for multiple asthma Biologics (injectable asthma medications), and I have sent a message to his pulmonologist awaiting response.

## 2023-12-29 ENCOUNTER — Other Ambulatory Visit: Payer: Self-pay | Admitting: Thoracic Surgery (Cardiothoracic Vascular Surgery)

## 2023-12-29 ENCOUNTER — Encounter: Payer: Self-pay | Admitting: Thoracic Surgery (Cardiothoracic Vascular Surgery)

## 2023-12-29 ENCOUNTER — Encounter: Payer: Self-pay | Admitting: *Deleted

## 2023-12-29 ENCOUNTER — Other Ambulatory Visit: Payer: Self-pay | Admitting: *Deleted

## 2023-12-29 ENCOUNTER — Institutional Professional Consult (permissible substitution) (INDEPENDENT_AMBULATORY_CARE_PROVIDER_SITE_OTHER): Payer: 59 | Admitting: Thoracic Surgery (Cardiothoracic Vascular Surgery)

## 2023-12-29 VITALS — BP 135/93 | HR 117 | Resp 18 | Ht 74.0 in | Wt 239.0 lb

## 2023-12-29 DIAGNOSIS — J849 Interstitial pulmonary disease, unspecified: Secondary | ICD-10-CM

## 2023-12-29 NOTE — H&P (View-Only) (Signed)
PCP is Olive Bass, FNP Referring Provider is Kalman Shan, MD  Chief Complaint  Patient presents with   Interstitial Lung Disease    Chest CT 6/6, PFT 10/2    HPI: [name] is sent for consultation for a lung biopsy for interstitial lung disease.  Mark Doyle is a 68 year old man with a history of asthma, COPD, hyperlipidemia, coronary calcification and interstitial lung disease.  He had asthma as a child.  Said he started noticing shortness of breath with exertion about 6 years ago but was mild initially.  Over the past 3 years has had progressive worsening of his shortness of breath.  He is never required home oxygen.  Gets short walking to his mailbox and back which is a total distance of about 50 yards.  Gets short of breath with walking up a flight of stairs.  He is a lifelong non-smoker.  Does have significant coronary calcification.  Saw Dr. Bing Matter over a year ago and had a stress test which showed no evidence of ischemia.   Past Medical History:  Diagnosis Date   Asthma    Environmental allergies     Past Surgical History:  Procedure Laterality Date   CHOLECYSTECTOMY     SPINE SURGERY     TONSILLECTOMY      Family History  Problem Relation Age of Onset   Asthma Mother    Heart disease Father    Stroke Father     Social History Social History   Tobacco Use   Smoking status: Never   Smokeless tobacco: Current    Types: Chew  Vaping Use   Vaping status: Never Used  Substance Use Topics   Alcohol use: Yes    Alcohol/week: 12.0 standard drinks of alcohol    Types: 12 Cans of beer per week    Comment: occasionally    Current Outpatient Medications  Medication Sig Dispense Refill   albuterol (PROVENTIL) (2.5 MG/3ML) 0.083% nebulizer solution Take 3 mLs (2.5 mg total) by nebulization every 2 (two) hours as needed for wheezing or shortness of breath. 150 mL 5   albuterol (VENTOLIN HFA) 108 (90 Base) MCG/ACT inhaler Inhale 2 puffs into  the lungs every 4 (four) hours as needed for wheezing or shortness of breath. 8.5 each 2   ARNUITY ELLIPTA 100 MCG/ACT AEPB TAKE 1 PUFF BY MOUTH EVERY DAY 30 each 5   aspirin 81 MG chewable tablet Chew 81 mg by mouth daily.     atorvastatin (LIPITOR) 20 MG tablet Take 1 tablet (20 mg total) by mouth daily. 30 tablet 0   levocetirizine (XYZAL) 5 MG tablet Take 1 tablet (5 mg total) by mouth every evening. 30 tablet 5   montelukast (SINGULAIR) 10 MG tablet Take 1 tablet (10 mg total) by mouth at bedtime. 32 tablet 5   Omega-3 Fatty Acids (FISH OIL PO) Take 1 tablet by mouth daily.     omeprazole (PRILOSEC) 20 MG capsule Take 1 capsule (20 mg total) by mouth daily. 90 capsule 3   Spacer/Aero-Holding Chambers (AEROCHAMBER MV) inhaler Use as instructed (Patient taking differently: 1 each by Other route as directed. Use as instructed) 1 each 0   triamcinolone cream (KENALOG) 0.1 % APPLY TO AFFECTED AREA TWICE A DAY 30 g 0   VITAMIN D PO Take 1 tablet by mouth daily. Unknown strength     No current facility-administered medications for this visit.    No Known Allergies  Review of Systems  Constitutional:  Positive for activity  change and fatigue.  HENT:  Negative for trouble swallowing and voice change.   Respiratory:  Positive for choking and shortness of breath.   Cardiovascular:  Negative for chest pain and leg swelling.  Gastrointestinal:  Negative for abdominal pain.  Genitourinary:  Negative for dysuria.  Musculoskeletal:  Negative for arthralgias.  Neurological:  Negative for seizures and weakness.  Hematological:  Negative for adenopathy. Does not bruise/bleed easily.  All other systems reviewed and are negative.   BP (!) 135/93 (BP Location: Left Arm)   Pulse (!) 117   Resp 18   Ht 6\' 2"  (1.88 m)   Wt 239 lb (108.4 kg)   SpO2 97% Comment: RA  BMI 30.69 kg/m  Physical Exam Vitals reviewed.  Constitutional:      General: He is not in acute distress.    Appearance: Normal  appearance.  HENT:     Head: Normocephalic and atraumatic.  Eyes:     General: No scleral icterus.    Extraocular Movements: Extraocular movements intact.  Cardiovascular:     Rate and Rhythm: Regular rhythm. Tachycardia present.     Heart sounds: No murmur heard. Pulmonary:     Effort: Pulmonary effort is normal. No respiratory distress.     Breath sounds: Normal breath sounds. No wheezing.  Abdominal:     General: There is no distension.     Palpations: Abdomen is soft.  Musculoskeletal:     Cervical back: Neck supple.     Right lower leg: No edema.     Left lower leg: No edema.  Lymphadenopathy:     Cervical: No cervical adenopathy.  Skin:    General: Skin is warm and dry.  Neurological:     General: No focal deficit present.     Mental Status: He is alert and oriented to person, place, and time.     Cranial Nerves: No cranial nerve deficit.      Diagnostic Tests: CT CHEST WITHOUT CONTRAST   TECHNIQUE: Multidetector CT imaging of the chest was performed following the standard protocol without intravenous contrast. High resolution imaging of the lungs, as well as inspiratory and expiratory imaging, was performed.   RADIATION DOSE REDUCTION: This exam was performed according to the departmental dose-optimization program which includes automated exposure control, adjustment of the mA and/or kV according to patient size and/or use of iterative reconstruction technique.   COMPARISON:  Chest CT 10/25/2022.   FINDINGS: Cardiovascular: Heart size is normal. There is no significant pericardial fluid, thickening or pericardial calcification. There is aortic atherosclerosis, as well as atherosclerosis of the great vessels of the mediastinum and the coronary arteries, including calcified atherosclerotic plaque in the left main, left anterior descending, left circumflex and right coronary arteries.   Mediastinum/Nodes: No pathologically enlarged mediastinal or  hilar lymph nodes. Please note that accurate exclusion of hilar adenopathy is limited on noncontrast CT scans. Esophagus is unremarkable in appearance. No axillary lymphadenopathy.   Lungs/Pleura: High-resolution images again demonstrate widespread areas of mild ground-glass attenuation, septal thickening, subpleural reticulation, thickening of the peribronchovascular interstitium and regional architectural distortion. These findings are most evident in the mid to lower lungs bilaterally. No definite traction bronchiectasis or honeycombing. No substantial progression of disease compared to the prior examination. Inspiratory and expiratory imaging demonstrates some mild air trapping indicative of mild small airways disease. There is also partial collapse of the trachea and mainstem bronchi, indicative of tracheobronchomalacia. No acute consolidative airspace disease. No pleural effusions. No definite suspicious appearing pulmonary nodules or  masses are noted.   Upper Abdomen: Diffuse low attenuation throughout the visualized hepatic parenchyma, indicative of a background of hepatic steatosis. Status post cholecystectomy.   Musculoskeletal: There are no aggressive appearing lytic or blastic lesions noted in the visualized portions of the skeleton.   IMPRESSION: 1. The appearance of the lungs is stable compared to the prior study, once again categorized as indeterminate for usual interstitial pneumonia (UIP) per current ATS guidelines. Repeat high-resolution chest CT should be considered in 12 months to assess for temporal changes in the appearance of the lung parenchyma. 2. Mild air trapping indicative of small airways disease. 3. Mild tracheobronchomalacia. 4. Hepatic steatosis. 5. Aortic atherosclerosis, in addition to left main and three-vessel coronary artery disease. Please note that although the presence of coronary artery calcium documents the presence of coronary  artery disease, the severity of this disease and any potential stenosis cannot be assessed on this non-gated CT examination. Assessment for potential risk factor modification, dietary therapy or pharmacologic therapy may be warranted, if clinically indicated.   Aortic Atherosclerosis (ICD10-I70.0).     Electronically Signed   By: Trudie Reed M.D.   On: 06/03/2023 09:56 I personally reviewed the CT images.  There is aortic and coronary atherosclerosis.  Interstitial changes in the basilar portions of the lung bilaterally with no definite honeycombing.  4.3 cm ascending aneurysm.  Echocardiogram 01/07/2023 No significant valvular disease normal left ventricular systolic function, grade 1 diastolic dysfunction.  Normal RV function  Impression:  Mark Doyle is a 68 year old man with a history of asthma, COPD, hyperlipidemia, coronary calcification and interstitial lung disease.  Has had ILD for several years now.  Has had progression of shortness of breath without a dramatic change in his CT.  Interstitial lung disease-followed by Dr. Marchelle Gearing.  Wants surgical biopsy for definitive diagnosis in order to guide appropriate therapy.  I discussed the proposed operative procedure right VATS for lung biopsy with Mr. View.  He understands this is a major operative procedure requiring general anesthesia, incisions, and the use of a chest tube postoperatively.  I informed him of the indications, risks, benefits, and alternatives.  He understands the risks include, but not limited to death, MI, DVT, PE, bleeding, possible need for transfusion, infection, air leak, cardiac arrhythmias, pain issues, as well as the possibility of other unforeseeable complications.  He accepts the risks and wishes to proceed.  Coronary calcification-significant calcification on CT. no anginal symptoms although activities very limited.  Had a negative stress test in August 2022.  Has an appointment with Dr.  Bing Matter on Friday.  Will check with him to see if any additional cardiac workup necessary prior to lung biopsy.  4.3 cm ascending aneurysm-will need annual follow-up.  Plan: High-resolution CT chest for preoperative planning. Follow-up with Dr. Bing Matter on Friday as scheduled Tentatively plan right VATS lung biopsy on 01/11/2024 if no additional cardiac workup is necessary.  Loreli Slot, MD [facility] [phone]

## 2023-12-29 NOTE — Progress Notes (Signed)
 PCP is Olive Bass, FNP Referring Provider is Kalman Shan, MD  Chief Complaint  Patient presents with   Interstitial Lung Disease    Chest CT 6/6, PFT 10/2    HPI: Mr. Paris is sent for consultation for a lung biopsy for interstitial lung disease.  Merlin Wheaton is a 68 year old man with a history of asthma, COPD, hyperlipidemia, coronary calcification and interstitial lung disease.  He had asthma as a child.  Said he started noticing shortness of breath with exertion about 6 years ago but was mild initially.  Over the past 3 years has had progressive worsening of his shortness of breath.  He is never required home oxygen.  Gets short walking to his mailbox and back which is a total distance of about 50 yards.  Gets short of breath with walking up a flight of stairs.  He is a lifelong non-smoker.  Does have significant coronary calcification.  Saw Dr. Bing Matter over a year ago and had a stress test which showed no evidence of ischemia.   Past Medical History:  Diagnosis Date   Asthma    Environmental allergies     Past Surgical History:  Procedure Laterality Date   CHOLECYSTECTOMY     SPINE SURGERY     TONSILLECTOMY      Family History  Problem Relation Age of Onset   Asthma Mother    Heart disease Father    Stroke Father     Social History Social History   Tobacco Use   Smoking status: Never   Smokeless tobacco: Current    Types: Chew  Vaping Use   Vaping status: Never Used  Substance Use Topics   Alcohol use: Yes    Alcohol/week: 12.0 standard drinks of alcohol    Types: 12 Cans of beer per week    Comment: occasionally    Current Outpatient Medications  Medication Sig Dispense Refill   albuterol (PROVENTIL) (2.5 MG/3ML) 0.083% nebulizer solution Take 3 mLs (2.5 mg total) by nebulization every 2 (two) hours as needed for wheezing or shortness of breath. 150 mL 5   albuterol (VENTOLIN HFA) 108 (90 Base) MCG/ACT inhaler Inhale 2 puffs into  the lungs every 4 (four) hours as needed for wheezing or shortness of breath. 8.5 each 2   ARNUITY ELLIPTA 100 MCG/ACT AEPB TAKE 1 PUFF BY MOUTH EVERY DAY 30 each 5   aspirin 81 MG chewable tablet Chew 81 mg by mouth daily.     atorvastatin (LIPITOR) 20 MG tablet Take 1 tablet (20 mg total) by mouth daily. 30 tablet 0   levocetirizine (XYZAL) 5 MG tablet Take 1 tablet (5 mg total) by mouth every evening. 30 tablet 5   montelukast (SINGULAIR) 10 MG tablet Take 1 tablet (10 mg total) by mouth at bedtime. 32 tablet 5   Omega-3 Fatty Acids (FISH OIL PO) Take 1 tablet by mouth daily.     omeprazole (PRILOSEC) 20 MG capsule Take 1 capsule (20 mg total) by mouth daily. 90 capsule 3   Spacer/Aero-Holding Chambers (AEROCHAMBER MV) inhaler Use as instructed (Patient taking differently: 1 each by Other route as directed. Use as instructed) 1 each 0   triamcinolone cream (KENALOG) 0.1 % APPLY TO AFFECTED AREA TWICE A DAY 30 g 0   VITAMIN D PO Take 1 tablet by mouth daily. Unknown strength     No current facility-administered medications for this visit.    No Known Allergies  Review of Systems  Constitutional:  Positive for activity  change and fatigue.  HENT:  Negative for trouble swallowing and voice change.   Respiratory:  Positive for choking and shortness of breath.   Cardiovascular:  Negative for chest pain and leg swelling.  Gastrointestinal:  Negative for abdominal pain.  Genitourinary:  Negative for dysuria.  Musculoskeletal:  Negative for arthralgias.  Neurological:  Negative for seizures and weakness.  Hematological:  Negative for adenopathy. Does not bruise/bleed easily.  All other systems reviewed and are negative.   BP (!) 135/93 (BP Location: Left Arm)   Pulse (!) 117   Resp 18   Ht 6\' 2"  (1.88 m)   Wt 239 lb (108.4 kg)   SpO2 97% Comment: RA  BMI 30.69 kg/m  Physical Exam Vitals reviewed.  Constitutional:      General: He is not in acute distress.    Appearance: Normal  appearance.  HENT:     Head: Normocephalic and atraumatic.  Eyes:     General: No scleral icterus.    Extraocular Movements: Extraocular movements intact.  Cardiovascular:     Rate and Rhythm: Regular rhythm. Tachycardia present.     Heart sounds: No murmur heard. Pulmonary:     Effort: Pulmonary effort is normal. No respiratory distress.     Breath sounds: Normal breath sounds. No wheezing.  Abdominal:     General: There is no distension.     Palpations: Abdomen is soft.  Musculoskeletal:     Cervical back: Neck supple.     Right lower leg: No edema.     Left lower leg: No edema.  Lymphadenopathy:     Cervical: No cervical adenopathy.  Skin:    General: Skin is warm and dry.  Neurological:     General: No focal deficit present.     Mental Status: He is alert and oriented to person, place, and time.     Cranial Nerves: No cranial nerve deficit.      Diagnostic Tests: CT CHEST WITHOUT CONTRAST   TECHNIQUE: Multidetector CT imaging of the chest was performed following the standard protocol without intravenous contrast. High resolution imaging of the lungs, as well as inspiratory and expiratory imaging, was performed.   RADIATION DOSE REDUCTION: This exam was performed according to the departmental dose-optimization program which includes automated exposure control, adjustment of the mA and/or kV according to patient size and/or use of iterative reconstruction technique.   COMPARISON:  Chest CT 10/25/2022.   FINDINGS: Cardiovascular: Heart size is normal. There is no significant pericardial fluid, thickening or pericardial calcification. There is aortic atherosclerosis, as well as atherosclerosis of the great vessels of the mediastinum and the coronary arteries, including calcified atherosclerotic plaque in the left main, left anterior descending, left circumflex and right coronary arteries.   Mediastinum/Nodes: No pathologically enlarged mediastinal or  hilar lymph nodes. Please note that accurate exclusion of hilar adenopathy is limited on noncontrast CT scans. Esophagus is unremarkable in appearance. No axillary lymphadenopathy.   Lungs/Pleura: High-resolution images again demonstrate widespread areas of mild ground-glass attenuation, septal thickening, subpleural reticulation, thickening of the peribronchovascular interstitium and regional architectural distortion. These findings are most evident in the mid to lower lungs bilaterally. No definite traction bronchiectasis or honeycombing. No substantial progression of disease compared to the prior examination. Inspiratory and expiratory imaging demonstrates some mild air trapping indicative of mild small airways disease. There is also partial collapse of the trachea and mainstem bronchi, indicative of tracheobronchomalacia. No acute consolidative airspace disease. No pleural effusions. No definite suspicious appearing pulmonary nodules or  masses are noted.   Upper Abdomen: Diffuse low attenuation throughout the visualized hepatic parenchyma, indicative of a background of hepatic steatosis. Status post cholecystectomy.   Musculoskeletal: There are no aggressive appearing lytic or blastic lesions noted in the visualized portions of the skeleton.   IMPRESSION: 1. The appearance of the lungs is stable compared to the prior study, once again categorized as indeterminate for usual interstitial pneumonia (UIP) per current ATS guidelines. Repeat high-resolution chest CT should be considered in 12 months to assess for temporal changes in the appearance of the lung parenchyma. 2. Mild air trapping indicative of small airways disease. 3. Mild tracheobronchomalacia. 4. Hepatic steatosis. 5. Aortic atherosclerosis, in addition to left main and three-vessel coronary artery disease. Please note that although the presence of coronary artery calcium documents the presence of coronary  artery disease, the severity of this disease and any potential stenosis cannot be assessed on this non-gated CT examination. Assessment for potential risk factor modification, dietary therapy or pharmacologic therapy may be warranted, if clinically indicated.   Aortic Atherosclerosis (ICD10-I70.0).     Electronically Signed   By: Trudie Reed M.D.   On: 06/03/2023 09:56 I personally reviewed the CT images.  There is aortic and coronary atherosclerosis.  Interstitial changes in the basilar portions of the lung bilaterally with no definite honeycombing.  4.3 cm ascending aneurysm.  Echocardiogram 01/07/2023 No significant valvular disease normal left ventricular systolic function, grade 1 diastolic dysfunction.  Normal RV function  Impression:  Sequoia Pepito is a 68 year old man with a history of asthma, COPD, hyperlipidemia, coronary calcification and interstitial lung disease.  Has had ILD for several years now.  Has had progression of shortness of breath without a dramatic change in his CT.  Interstitial lung disease-followed by Dr. Marchelle Gearing.  Wants surgical biopsy for definitive diagnosis in order to guide appropriate therapy.  I discussed the proposed operative procedure right VATS for lung biopsy with Mr. View.  He understands this is a major operative procedure requiring general anesthesia, incisions, and the use of a chest tube postoperatively.  I informed him of the indications, risks, benefits, and alternatives.  He understands the risks include, but not limited to death, MI, DVT, PE, bleeding, possible need for transfusion, infection, air leak, cardiac arrhythmias, pain issues, as well as the possibility of other unforeseeable complications.  He accepts the risks and wishes to proceed.  Coronary calcification-significant calcification on CT. no anginal symptoms although activities very limited.  Had a negative stress test in August 2022.  Has an appointment with Dr.  Bing Matter on Friday.  Will check with him to see if any additional cardiac workup necessary prior to lung biopsy.  4.3 cm ascending aneurysm-will need annual follow-up.  Plan: High-resolution CT chest for preoperative planning. Follow-up with Dr. Bing Matter on Friday as scheduled Tentatively plan right VATS lung biopsy on 01/11/2024 if no additional cardiac workup is necessary.  Loreli Slot, MD [facility] [phone]

## 2023-12-31 ENCOUNTER — Encounter: Payer: Self-pay | Admitting: Cardiology

## 2024-01-01 ENCOUNTER — Ambulatory Visit (INDEPENDENT_AMBULATORY_CARE_PROVIDER_SITE_OTHER): Payer: 59 | Admitting: Cardiology

## 2024-01-01 ENCOUNTER — Ambulatory Visit (HOSPITAL_BASED_OUTPATIENT_CLINIC_OR_DEPARTMENT_OTHER): Payer: 59 | Admitting: Cardiology

## 2024-01-01 ENCOUNTER — Encounter: Payer: Self-pay | Admitting: Cardiology

## 2024-01-01 ENCOUNTER — Ambulatory Visit (HOSPITAL_BASED_OUTPATIENT_CLINIC_OR_DEPARTMENT_OTHER)
Admission: RE | Admit: 2024-01-01 | Discharge: 2024-01-01 | Disposition: A | Discharge status: Home / Self Care | Payer: 59 | Source: Ambulatory Visit | Attending: Thoracic Surgery (Cardiothoracic Vascular Surgery) | Admitting: Thoracic Surgery (Cardiothoracic Vascular Surgery)

## 2024-01-01 VITALS — BP 112/80 | HR 103 | Ht 74.0 in | Wt 239.0 lb

## 2024-01-01 DIAGNOSIS — I7121 Aneurysm of the ascending aorta, without rupture: Secondary | ICD-10-CM | POA: Diagnosis not present

## 2024-01-01 DIAGNOSIS — R0609 Other forms of dyspnea: Secondary | ICD-10-CM

## 2024-01-01 DIAGNOSIS — Z01810 Encounter for preprocedural cardiovascular examination: Secondary | ICD-10-CM | POA: Insufficient documentation

## 2024-01-01 DIAGNOSIS — I2510 Atherosclerotic heart disease of native coronary artery without angina pectoris: Secondary | ICD-10-CM

## 2024-01-01 DIAGNOSIS — J849 Interstitial pulmonary disease, unspecified: Secondary | ICD-10-CM | POA: Insufficient documentation

## 2024-01-01 DIAGNOSIS — R918 Other nonspecific abnormal finding of lung field: Secondary | ICD-10-CM | POA: Diagnosis not present

## 2024-01-01 DIAGNOSIS — J449 Chronic obstructive pulmonary disease, unspecified: Secondary | ICD-10-CM

## 2024-01-01 NOTE — Patient Instructions (Addendum)
Medication Instructions:  Your physician recommends that you continue on your current medications as directed. Please refer to the Current Medication list given to you today.  *If you need a refill on your cardiac medications before your next appointment, please call your pharmacy*   Lab Work: None Ordered If you have labs (blood work) drawn today and your tests are completely normal, you will receive your results only by: [place] (if you have MyChart) OR A paper copy in the mail If you have any lab test that is abnormal or we need to change your treatment, we will call you to review the results.   Testing/Procedures: Your physician has requested that you have a lexiscan myoview. For further information please visit [email or web address]. Please follow instruction sheet, as given.  The test will take approximately 3 to 4 hours to complete; you may bring reading material.  If someone comes with you to your appointment, they will need to remain in the main lobby due to limited space in the testing area.  How to prepare for your Myocardial Perfusion Test: Do not eat or drink 3 hours prior to your test, except you may have water. Do not consume products containing caffeine (regular or decaffeinated) 12 hours prior to your test. (ex: coffee, chocolate, sodas, tea). Do bring a list of your current medications with you.  If not listed below, you may take your medications as normal. Do wear comfortable clothes (no dresses or overalls) and walking shoes, tennis shoes preferred (No heels or open toe shoes are allowed). Do NOT wear cologne, perfume, aftershave, or lotions (deodorant is allowed). If these instructions are not followed, your test will have to be rescheduled.     Follow-Up: At [facility], you and your health needs are our priority.  As part of our continuing mission to provide you with exceptional heart care, we have created designated Provider Care Teams.  These Care Teams  include your primary Cardiologist (physician) and Advanced Practice Providers (APPs -  Physician Assistants and Nurse Practitioners) who all work together to provide you with the care you need, when you need it.  We recommend signing up for the patient portal called "MyChart".  Sign up information is provided on this After Visit Summary.  MyChart is used to connect with patients for Virtual Visits (Telemedicine).  Patients are able to view lab/test results, encounter notes, upcoming appointments, etc.  Non-urgent messages can be sent to your provider as well.   To learn more about what you can do with MyChart, go to [email or web address].    Your next appointment:   5 month(s)  The format for your next appointment:   In Person  Provider:   Jenne Campus, MD    Other Instructions NA

## 2024-01-01 NOTE — Progress Notes (Signed)
 Cardiology Office Note:    Date:  01/01/2024   ID:  Mark Doyle, DOB Jan 22, 1956, MRN [id number]  PCP:  Mark Leita Repine, FNP  Cardiologist:  Lamar Fitch, MD    Referring MD: Mark Doyle,*   Chief Complaint  Patient presents with   Medical Clearance    Lung Biopsy Dr. Elspeth Millers    History of Present Illness:    Mark Doyle is a 68 y.o. male past medical history significant for calcification of coronary artery, echocardiogram showed preserved left ventricle ejection fraction, he supposed to have a stress test done but did not happen.  He was referred back to us  because he is getting ready to have lung biopsy done which will be done under general esthesia.  He is able to exercise limited because of shortness of breath he does have chronic lung condition.  Denies having any typical chest pain tightness squeezing pressure mid chest or shortness of breath with exertion  Past Medical History:  Diagnosis Date   Asthma    Environmental allergies     Past Surgical History:  Procedure Laterality Date   CHOLECYSTECTOMY     SPINE SURGERY     TONSILLECTOMY      Current Medications: Current Meds  Medication Sig   albuterol  (PROVENTIL ) (2.5 MG/3ML) 0.083% nebulizer solution Take 3 mLs (2.5 mg total) by nebulization every 2 (two) hours as needed for wheezing or shortness of breath.   albuterol  (VENTOLIN  HFA) 108 (90 Base) MCG/ACT inhaler Inhale 2 puffs into the lungs every 4 (four) hours as needed for wheezing or shortness of breath.   ARNUITY ELLIPTA  100 MCG/ACT AEPB TAKE 1 PUFF BY MOUTH EVERY DAY (Patient taking differently: Inhale 1 puff into the lungs daily.)   aspirin 81 MG chewable tablet Chew 81 mg by mouth in the morning.   atorvastatin  (LIPITOR) 20 MG tablet Take 1 tablet (20 mg total) by mouth daily.   Budeson-Glycopyrrol-Formoterol  (BREZTRI  AEROSPHERE) 160-9-4.8 MCG/ACT AERO Inhale 2 puffs into the lungs in the morning and at bedtime.    levocetirizine (XYZAL ) 5 MG tablet Take 1 tablet (5 mg total) by mouth every evening.   montelukast  (SINGULAIR ) 10 MG tablet Take 1 tablet (10 mg total) by mouth at bedtime.   omeprazole  (PRILOSEC) 20 MG capsule Take 1 capsule (20 mg total) by mouth daily.   Spacer/Aero-Holding Chambers (AEROCHAMBER MV) inhaler Use as instructed (Patient taking differently: 1 each by Other route as directed. Use as instructed)   triamcinolone  cream (KENALOG ) 0.1 % APPLY TO AFFECTED AREA TWICE A DAY (Patient taking differently: Apply 1 Application topically 2 (two) times daily.)     Allergies:   Patient has no known allergies.   Social History   Socioeconomic History   Marital status: Married    Spouse name: Not on file   Number of children: Not on file   Years of education: Not on file   Highest education level: Not on file  Occupational History   Not on file  Tobacco Use   Smoking status: Never   Smokeless tobacco: Current    Types: Chew  Vaping Use   Vaping status: Never Used  Substance and Sexual Activity   Alcohol use: Yes    Alcohol/week: 12.0 standard drinks of alcohol    Types: 12 Cans of beer per week    Comment: occasionally   Drug use: Not on file   Sexual activity: Not Currently  Other Topics Concern   Not on file  Social History Narrative  Not on file   Social Drivers of Health   Financial Resource Strain: Low Risk  (10/20/2023)   Overall Financial Resource Strain (CARDIA)    Difficulty of Paying Living Expenses: Not hard at all  Food Insecurity: No Food Insecurity (10/20/2023)   Hunger Vital Sign    Worried About Running Out of Food in the Last Year: Never true    Ran Out of Food in the Last Year: Never true  Transportation Needs: No Transportation Needs (10/20/2023)   PRAPARE - Administrator, Civil Service (Medical): No    Lack of Transportation (Non-Medical): No  Physical Activity: Inactive (10/20/2023)   Exercise Vital Sign    Days of Exercise per Week:  0 days    Minutes of Exercise per Session: 0 min  Stress: No Stress Concern Present (10/20/2023)   [employer] of Occupational Health - Occupational Stress Questionnaire    Feeling of Stress : Not at all  Social Connections: Moderately Isolated (10/20/2023)   Social Connection and Isolation Panel [NHANES]    Frequency of Communication with Friends and Family: More than three times a week    Frequency of Social Gatherings with Friends and Family: More than three times a week    Attends Religious Services: Never    Database Administrator or Organizations: No    Attends Engineer, Structural: Never    Marital Status: Married     Family History: The patient's family history includes Asthma in his mother; Heart disease in his father; Stroke in his father. ROS:   Please see the history of present illness.    All 14 point review of systems negative except as described per history of present illness  EKGs/Labs/Other Studies Reviewed:    EKG Interpretation Date/Time:  Friday January 01 2024 11:04:38 EST Ventricular Rate:  103 PR Interval:  182 QRS Duration:  82 QT Interval:  330 QTC Calculation: 432 R Axis:   32  Text Interpretation: Sinus tachycardia with occasional Premature ventricular complexes Low voltage QRS Possible Inferior infarct , age undetermined Cannot rule out Anterior infarct , age undetermined No previous ECGs available Confirmed by Bernie Charleston [id number]) on 01/01/2024 11:27:12 AM    Recent Labs: 06/23/2023: ALT 21; BUN 8; Creatinine, Ser 0.76; Potassium 3.5; Sodium 133 12/25/2023: Hemoglobin 16.2; Platelets 325  Recent Lipid Panel    Component Value Date/Time   CHOL 163 10/27/2023 1117   TRIG 57.0 10/27/2023 1117   HDL 75.40 10/27/2023 1117   CHOLHDL 2 10/27/2023 1117   VLDL 11.4 10/27/2023 1117   LDLCALC 76 10/27/2023 1117    Physical Exam:    VS:  BP 112/80 (BP Location: Right Arm, Patient Position: Sitting)   Pulse (!) 103   Ht 6' 2 (1.88  m)   Wt 239 lb (108.4 kg)   SpO2 95%   BMI 30.69 kg/m     Wt Readings from Last 3 Encounters:  01/01/24 239 lb (108.4 kg)  12/29/23 239 lb (108.4 kg)  12/25/23 240 lb (108.9 kg)     GEN:  Well nourished, well developed in no acute distress HEENT: Normal NECK: No JVD; No carotid bruits LYMPHATICS: No lymphadenopathy CARDIAC: RRR, no murmurs, no rubs, no gallops RESPIRATORY:  Clear to auscultation without rales, wheezing or rhonchi  ABDOMEN: Soft, non-tender, non-distended MUSCULOSKELETAL:  No edema; No deformity  SKIN: Warm and dry LOWER EXTREMITIES: no swelling NEUROLOGIC:  Alert and oriented x 3 PSYCHIATRIC:  Normal affect   ASSESSMENT:  1. Coronary artery calcification   2. Preop cardiovascular exam   3. Dyspnea on exertion   4. ILD (interstitial lung disease) (HCC)   5. Obstructive lung disease (HCC)    PLAN:    In order of problems listed above:  Cardiovascular preop evaluation this gentleman with calcification of the coronary arteries and multiple risk factors for coronary artery disease.  I think the prudent approach will be to perform stress test on him, we will schedule him to have Lexiscan . Dyslipidemia I did review K PN which show me data from November of last year with LDL 76 HDL 75.  Will continue present management which include moderate intensity statin for of Lipitor 20 we will continue aspirin as well.  If aspirin need to be withdrawn from a surgical point of view for procedure that should be fine to perform.  If his stress test is negative I think he will be reasonable candidate for surgery without much limitations.  From cardiac standpoint review. Interstitial lung disease with obstructive lung disease.  Noted contributing factor   Medication Adjustments/Labs and Tests Ordered: Current medicines are reviewed at length with the patient today.  Concerns regarding medicines are outlined above.  Orders Placed This Encounter  Procedures   EKG 12-Lead    Medication changes: No orders of the defined types were placed in this encounter.   Signed, Lamar DOROTHA Fitch, MD, [facility] 01/01/2024 11:37 AM    [facility]

## 2024-01-01 NOTE — Addendum Note (Signed)
 Addended by: Baldo Ash D on: 01/01/2024 11:50 AM   Modules accepted: Orders

## 2024-01-05 ENCOUNTER — Ambulatory Visit (HOSPITAL_COMMUNITY): Payer: 59 | Attending: Cardiology

## 2024-01-05 DIAGNOSIS — Z01810 Encounter for preprocedural cardiovascular examination: Secondary | ICD-10-CM | POA: Diagnosis not present

## 2024-01-05 LAB — MYOCARDIAL PERFUSION IMAGING
LV dias vol: 49 mL (ref 62–150)
LV sys vol: 17 mL
Nuc Stress EF: 65 %
Peak HR: 104 {beats}/min
Rest HR: 93 {beats}/min
Rest Nuclear Isotope Dose: 10.7 mCi
SDS: 1
SRS: 0
SSS: 1
ST Depression (mm): 0 mm
Stress Nuclear Isotope Dose: 32.1 mCi
TID: 0.92

## 2024-01-05 MED ORDER — TECHNETIUM TC 99M TETROFOSMIN IV KIT
10.7000 | PACK | Freq: Once | INTRAVENOUS | Status: AC | PRN
Start: 2024-01-05 — End: 2024-01-05
  Administered 2024-01-05: 10.7 via INTRAVENOUS

## 2024-01-05 MED ORDER — REGADENOSON 0.4 MG/5ML IV SOLN
0.4000 mg | Freq: Once | INTRAVENOUS | Status: AC
  Administered 2024-01-05: 0.4 mg via INTRAVENOUS

## 2024-01-05 MED ORDER — TECHNETIUM TC 99M TETROFOSMIN IV KIT
32.1000 | PACK | Freq: Once | INTRAVENOUS | Status: AC | PRN
  Administered 2024-01-05: 32.1 via INTRAVENOUS

## 2024-01-06 ENCOUNTER — Encounter (HOSPITAL_COMMUNITY): Payer: Self-pay

## 2024-01-06 NOTE — Progress Notes (Signed)
PCP - Ria Clock, FNP Cardiologist - Dr Gypsy Balsam (H&P & medical clearance on chart) Pulmonology - Dr Kalman Shan  CT Chest x-ray - 01/01/24 EKG - 01/01/24 Stress Test - 01/05/24 ECHO - 01/07/23 Cardiac Cath - n/a  ICD Pacemaker/Loop - n/a  Sleep Study -  n/a CPAP - none  Diabetes - n/a  Blood Thinner Instructions:  n/a  Aspirin Instructions: Follow your surgeon's instructions on when to stop aspirin prior to surgery,  If no instructions were given by your surgeon then you will need to call the office for those instructions.  NPO   Anesthesia review: Yes  STOP now taking any Aspirin (unless otherwise instructed by your surgeon), Aleve, Naproxen, Ibuprofen, Motrin, Advil, Goody's, BC's, all herbal medications, fish oil, and all vitamins.   Coronavirus Screening Do you have any of the following symptoms:  Cough Yes Fever (>100.21F)  yes/no: No Runny nose yes/no: No Sore throat yes/no: No Difficulty breathing/shortness of breath  Yes  Have you traveled in the last 14 days and where? yes/no: No  Patient verbalized understanding of instructions that were given to them at the PAT appointment. Patient was also instructed that they will need to review over the PAT instructions again at home before surgery.

## 2024-01-07 ENCOUNTER — Encounter (HOSPITAL_COMMUNITY)
Admission: RE | Admit: 2024-01-07 | Discharge: 2024-01-07 | Disposition: A | Discharge status: Home / Self Care | Payer: 59 | Source: Ambulatory Visit | Attending: Thoracic Surgery (Cardiothoracic Vascular Surgery) | Admitting: Thoracic Surgery (Cardiothoracic Vascular Surgery)

## 2024-01-07 ENCOUNTER — Encounter (HOSPITAL_COMMUNITY): Payer: Self-pay

## 2024-01-07 ENCOUNTER — Other Ambulatory Visit: Payer: Self-pay

## 2024-01-07 ENCOUNTER — Ambulatory Visit (HOSPITAL_COMMUNITY)
Admission: RE | Admit: 2024-01-07 | Discharge: 2024-01-07 | Disposition: A | Discharge status: Home / Self Care | Payer: 59 | Source: Ambulatory Visit | Attending: Thoracic Surgery (Cardiothoracic Vascular Surgery) | Admitting: Thoracic Surgery (Cardiothoracic Vascular Surgery)

## 2024-01-07 VITALS — BP 127/101 | HR 102 | Temp 97.0°F | Resp 18 | Ht 74.0 in | Wt 241.7 lb

## 2024-01-07 DIAGNOSIS — Z01818 Encounter for other preprocedural examination: Secondary | ICD-10-CM | POA: Diagnosis not present

## 2024-01-07 DIAGNOSIS — J849 Interstitial pulmonary disease, unspecified: Secondary | ICD-10-CM | POA: Insufficient documentation

## 2024-01-07 DIAGNOSIS — Z01810 Encounter for preprocedural cardiovascular examination: Secondary | ICD-10-CM

## 2024-01-07 HISTORY — DX: Atherosclerotic heart disease of native coronary artery without angina pectoris: I25.10

## 2024-01-07 HISTORY — DX: Hyperlipidemia, unspecified: E78.5

## 2024-01-07 HISTORY — DX: Gastro-esophageal reflux disease without esophagitis: K21.9

## 2024-01-07 HISTORY — DX: Interstitial pulmonary disease, unspecified: J84.9

## 2024-01-07 HISTORY — DX: Chronic obstructive pulmonary disease, unspecified: J44.9

## 2024-01-07 HISTORY — DX: Essential (primary) hypertension: I10

## 2024-01-07 HISTORY — DX: Dyspnea, unspecified: R06.00

## 2024-01-07 HISTORY — DX: Unspecified macular degeneration: H35.30

## 2024-01-07 HISTORY — DX: Dependence on supplemental oxygen: Z99.81

## 2024-01-07 LAB — URINALYSIS, ROUTINE W REFLEX MICROSCOPIC
Bilirubin Urine: NEGATIVE
Glucose, UA: NEGATIVE mg/dL
Hgb urine dipstick: NEGATIVE
Ketones, ur: NEGATIVE mg/dL
Leukocytes,Ua: NEGATIVE
Nitrite: NEGATIVE
Protein, ur: NEGATIVE mg/dL
Specific Gravity, Urine: 1.004 — ABNORMAL LOW (ref 1.005–1.030)
pH: 5 (ref 5.0–8.0)

## 2024-01-07 LAB — COMPREHENSIVE METABOLIC PANEL
ALT: 34 U/L (ref 0–44)
AST: 26 U/L (ref 15–41)
Albumin: 3.6 g/dL (ref 3.5–5.0)
Alkaline Phosphatase: 91 U/L (ref 38–126)
Anion gap: 12 (ref 5–15)
BUN: <5 mg/dL — ABNORMAL LOW (ref 8–23)
CO2: 24 mmol/L (ref 22–32)
Calcium: 8.9 mg/dL (ref 8.9–10.3)
Chloride: 99 mmol/L (ref 98–111)
Creatinine, Ser: 0.78 mg/dL (ref 0.61–1.24)
GFR, Estimated: >60 mL/min (ref >60)
Glucose, Bld: 98 mg/dL (ref 70–99)
Potassium: 3.7 mmol/L (ref 3.5–5.1)
Sodium: 135 mmol/L (ref 135–145)
Total Bilirubin: 1 mg/dL (ref 0.0–1.2)
Total Protein: 7.3 g/dL (ref 6.5–8.1)

## 2024-01-07 LAB — CBC
HCT: 44.5 % (ref 39.0–52.0)
Hemoglobin: 15.4 g/dL (ref 13.0–17.0)
MCH: 33.8 pg (ref 26.0–34.0)
MCHC: 34.6 g/dL (ref 30.0–36.0)
MCV: 97.8 fL (ref 80.0–100.0)
Platelets: 238 10*3/uL (ref 150–400)
RBC: 4.55 MIL/uL (ref 4.22–5.81)
RDW: 12.7 % (ref 11.5–15.5)
WBC: 8.1 10*3/uL (ref 4.0–10.5)
nRBC: 0 % (ref 0.0–0.2)

## 2024-01-07 LAB — TYPE AND SCREEN
ABO/RH(D): O POS
Antibody Screen: NEGATIVE

## 2024-01-07 LAB — APTT: aPTT: 26 s (ref 24–36)

## 2024-01-07 LAB — PROTIME-INR
INR: 0.9 (ref 0.8–1.2)
Prothrombin Time: 12.8 s (ref 11.4–15.2)

## 2024-01-07 LAB — SURGICAL PCR SCREEN
MRSA, PCR: NEGATIVE
Staphylococcus aureus: POSITIVE — AB

## 2024-01-08 NOTE — Anesthesia Preprocedure Evaluation (Signed)
Anesthesia Evaluation  Patient identified by MRN, date of birth, ID band Patient awake    Reviewed: Allergy & Precautions, NPO status , Patient's Chart, lab work & pertinent test results  Airway Mallampati: II  TM Distance: >3 FB Neck ROM: Full    Dental  (+) Dental Advisory Given   Pulmonary shortness of breath, asthma , COPD ILD   breath sounds clear to auscultation       Cardiovascular hypertension, Pt. on medications + CAD and + Peripheral Vascular Disease   Rhythm:Regular Rate:Normal     Neuro/Psych negative neurological ROS     GI/Hepatic Neg liver ROS,GERD  ,,  Endo/Other  negative endocrine ROS    Renal/GU negative Renal ROS     Musculoskeletal   Abdominal   Peds  Hematology negative hematology ROS (+)   Anesthesia Other Findings   Reproductive/Obstetrics                             Anesthesia Physical Anesthesia Plan  ASA: 3  Anesthesia Plan: General   Post-op Pain Management: Tylenol PO (pre-op)*   Induction: Intravenous  PONV Risk Score and Plan: 2 and Dexamethasone, Ondansetron and Treatment may vary due to age or medical condition  Airway Management Planned: Double Lumen EBT  Additional Equipment: ClearSight  Intra-op Plan:   Post-operative Plan: Extubation in OR  Informed Consent: I have reviewed the patients History and Physical, chart, labs and discussed the procedure including the risks, benefits and alternatives for the proposed anesthesia with the patient or authorized representative who has indicated his/her understanding and acceptance.     Dental advisory given  Plan Discussed with: CRNA  Anesthesia Plan Comments: (PAT note by Antionette Poles, PA-C: 68 yo male with pertinent hx including interstitial lung disease, asthma, COPD, GERD, HTN, HILD, coronary calcifications on CT.  Seen by cardiologist Dr. Bing Matter on 01/01/2024 for preop evaluation.  Per  note, "Cardiovascular preop evaluation this gentleman with calcification of the coronary arteries and multiple risk factors for coronary artery disease.  I think the prudent approach will be to perform stress test on him, we will schedule him to have Lexiscan."  Nuclear stress 01/05/2024 was low risk, EF 65%, no evidence of ischemia or infarction.  Prior echo in 12/2022 showed EF 60 to 65%, moderate LVH, grade 1 DD, normal RV function, no significant valvular abnormalities, aneurysm of ascending aorta measuring 43 mm (Dr. Dorris Fetch recommended annual follow-up).  Follows with allergy/asthma Dr. Maurine Minister for history of severe persistent asthma.  He is currently maintained on Breztri inhaler.  Last seen 12/25/2023.  Per note, stable at that time, chronic DOE, no recent hospitalizations, ER visits, or oral steroid use.  Discussed potential benefit of biologic therapy for asthma control, not yet started.  Spirometry done at that time showed "FVC: 2.09L (pre), 2.06L  (post) FEV1: 1.43L, 38% predicted (pre), 1.52L, 41% predicted (post), +6% FEV1/FVC ratio: 0.68 (pre), 0.71 (post). Interpretation: Nonobstructive ratio, low FEV1, possible restriction. Without significant improvement, suspect related to effort."  Patient also follows with pulmonologist Dr. Marchelle Gearing for ILD.  Last seen 10/11/2023 and was referred at that time to cardiothoracic surgery for surgical lung biopsy.  Preop labs reviewed, unremarkable.  EKG 01/01/2024: Sinus tachycardia with occasional Premature ventricular complexes.  Rate 103. Low voltage QRS. Possible Inferior infarct , age undetermined. Cannot rule out Anterior infarct , age undetermined  Updated HRCT chest was done 01/01/2024 but has not yet been read.  HRCT  chest 05/28/2023: IMPRESSION: 1. The appearance of the lungs is stable compared to the prior study, once again categorized as indeterminate for usual interstitial pneumonia (UIP) per current ATS guidelines. Repeat high-resolution  chest CT should be considered in 12 months to assess for temporal changes in the appearance of the lung parenchyma. 2. Mild air trapping indicative of small airways disease. 3. Mild tracheobronchomalacia. 4. Hepatic steatosis. 5. Aortic atherosclerosis, in addition to left main and three-vessel coronary artery disease. Please note that although the presence of coronary artery calcium documents the presence of coronary artery disease, the severity of this disease and any potential stenosis cannot be assessed on this non-gated CT examination. Assessment for potential risk factor modification, dietary therapy or pharmacologic therapy may be warranted, if clinically indicated.  Nuclear stress 01/05/2024:   The study is normal. Findings are consistent with no ischemia and no infarction. The study is low risk.   No ST deviation was noted.   LV perfusion is normal. There is no evidence of ischemia. There is no evidence of infarction.   Left ventricular function is normal. Nuclear stress EF: 65%. The left ventricular ejection fraction is normal (55-65%). End diastolic cavity size is normal. End systolic cavity size is normal.  TTE 01/07/2023: 1. Left ventricular ejection fraction, by estimation, is 60 to 65%. The  left ventricle has normal function. The left ventricle has no regional  wall motion abnormalities. There is moderate left ventricular hypertrophy.  Left ventricular diastolic  parameters are consistent with Grade I diastolic dysfunction (impaired  relaxation).  2. Right ventricular systolic function is normal. The right ventricular  size is normal.  3. The mitral valve is normal in structure. No evidence of mitral valve  regurgitation. No evidence of mitral stenosis.  4. The aortic valve is normal in structure. Aortic valve regurgitation is  not visualized. No aortic stenosis is present.  5. Aneurysm of the ascending aorta, measuring 43 mm.  6. The inferior vena cava is  normal in size with greater than 50%  respiratory variability, suggesting right atrial pressure of 3 mmHg.       )        Anesthesia Quick Evaluation

## 2024-01-08 NOTE — Progress Notes (Signed)
Anesthesia Chart Review:  68 yo male with pertinent hx including interstitial lung disease, asthma, COPD, GERD, HTN, HILD, coronary calcifications on CT.  Seen by cardiologist Dr. Bing Matter on 01/01/2024 for preop evaluation.  Per note, "Cardiovascular preop evaluation this gentleman with calcification of the coronary arteries and multiple risk factors for coronary artery disease.  I think the prudent approach will be to perform stress test on him, we will schedule him to have Lexiscan."  Nuclear stress 01/05/2024 was low risk, EF 65%, no evidence of ischemia or infarction.  Prior echo in 12/2022 showed EF 60 to 65%, moderate LVH, grade 1 DD, normal RV function, no significant valvular abnormalities, aneurysm of ascending aorta measuring 43 mm (Dr. Dorris Fetch recommended annual follow-up).  Follows with allergy/asthma Dr. Maurine Minister for history of severe persistent asthma.  He is currently maintained on Breztri inhaler.  Last seen 12/25/2023.  Per note, stable at that time, chronic DOE, no recent hospitalizations, ER visits, or oral steroid use.  Discussed potential benefit of biologic therapy for asthma control, not yet started.  Spirometry done at that time showed "FVC: 2.09L (pre), 2.06L  (post) FEV1: 1.43L, 38% predicted (pre), 1.52L, 41% predicted (post), +6% FEV1/FVC ratio: 0.68 (pre), 0.71 (post). Interpretation: Nonobstructive ratio, low FEV1, possible restriction. Without significant improvement, suspect related to effort."  Patient also follows with pulmonologist Dr. Marchelle Gearing for ILD.  Last seen 10/11/2023 and was referred at that time to cardiothoracic surgery for surgical lung biopsy.  Preop labs reviewed, unremarkable.  EKG 01/01/2024: Sinus tachycardia with occasional Premature ventricular complexes.  Rate 103. Low voltage QRS. Possible Inferior infarct , age undetermined. Cannot rule out Anterior infarct , age undetermined  Updated HRCT chest was done 01/01/2024 but has not yet been read.  HRCT  chest 05/28/2023: IMPRESSION: 1. The appearance of the lungs is stable compared to the prior study, once again categorized as indeterminate for usual interstitial pneumonia (UIP) per current ATS guidelines. Repeat high-resolution chest CT should be considered in 12 months to assess for temporal changes in the appearance of the lung parenchyma. 2. Mild air trapping indicative of small airways disease. 3. Mild tracheobronchomalacia. 4. Hepatic steatosis. 5. Aortic atherosclerosis, in addition to left main and three-vessel coronary artery disease. Please note that although the presence of coronary artery calcium documents the presence of coronary artery disease, the severity of this disease and any potential stenosis cannot be assessed on this non-gated CT examination. Assessment for potential risk factor modification, dietary therapy or pharmacologic therapy may be warranted, if clinically indicated.  Nuclear stress 01/05/2024:   The study is normal. Findings are consistent with no ischemia and no infarction. The study is low risk.   No ST deviation was noted.   LV perfusion is normal. There is no evidence of ischemia. There is no evidence of infarction.   Left ventricular function is normal. Nuclear stress EF: 65%. The left ventricular ejection fraction is normal (55-65%). End diastolic cavity size is normal. End systolic cavity size is normal.  TTE 01/07/2023:  1. Left ventricular ejection fraction, by estimation, is 60 to 65%. The  left ventricle has normal function. The left ventricle has no regional  wall motion abnormalities. There is moderate left ventricular hypertrophy.  Left ventricular diastolic  parameters are consistent with Grade I diastolic dysfunction (impaired  relaxation).   2. Right ventricular systolic function is normal. The right ventricular  size is normal.   3. The mitral valve is normal in structure. No evidence of mitral valve  regurgitation.  No evidence of  mitral stenosis.   4. The aortic valve is normal in structure. Aortic valve regurgitation is  not visualized. No aortic stenosis is present.   5. Aneurysm of the ascending aorta, measuring 43 mm.   6. The inferior vena cava is normal in size with greater than 50%  respiratory variability, suggesting right atrial pressure of 3 mmHg.      [facility] Short Stay Center/Anesthesiology Phone [phone] 01/08/2024 10:27 AM

## 2024-01-08 NOTE — Progress Notes (Signed)
Patient was called to be informed that the surgery time for Monday was changed to 13:00 o'clock. Patient was aware and he verbalized that he will be at the hospital at 11:00 o'clock.

## 2024-01-11 ENCOUNTER — Inpatient Hospital Stay (HOSPITAL_COMMUNITY): Payer: 59

## 2024-01-11 ENCOUNTER — Inpatient Hospital Stay (HOSPITAL_COMMUNITY): Payer: 59 | Admitting: Physician Assistant

## 2024-01-11 ENCOUNTER — Inpatient Hospital Stay (HOSPITAL_COMMUNITY): Payer: 59 | Admitting: Certified Registered Nurse Anesthetist

## 2024-01-11 ENCOUNTER — Encounter (HOSPITAL_COMMUNITY)
Admission: RE | Disposition: A | Discharge status: Home / Self Care | Payer: Self-pay | Source: Home / Self Care | Attending: Thoracic Surgery (Cardiothoracic Vascular Surgery)

## 2024-01-11 ENCOUNTER — Other Ambulatory Visit: Payer: Self-pay

## 2024-01-11 ENCOUNTER — Inpatient Hospital Stay (HOSPITAL_COMMUNITY)
Admission: RE | Admit: 2024-01-11 | Discharge: 2024-01-13 | DRG: 168 | Disposition: A | Discharge status: Home / Self Care | Payer: 59 | Attending: Thoracic Surgery (Cardiothoracic Vascular Surgery) | Admitting: Thoracic Surgery (Cardiothoracic Vascular Surgery)

## 2024-01-11 ENCOUNTER — Encounter (HOSPITAL_COMMUNITY): Payer: Self-pay | Admitting: Thoracic Surgery (Cardiothoracic Vascular Surgery)

## 2024-01-11 DIAGNOSIS — I2510 Atherosclerotic heart disease of native coronary artery without angina pectoris: Secondary | ICD-10-CM | POA: Diagnosis not present

## 2024-01-11 DIAGNOSIS — J4520 Mild intermittent asthma, uncomplicated: Secondary | ICD-10-CM | POA: Diagnosis not present

## 2024-01-11 DIAGNOSIS — Z8249 Family history of ischemic heart disease and other diseases of the circulatory system: Secondary | ICD-10-CM | POA: Diagnosis not present

## 2024-01-11 DIAGNOSIS — J939 Pneumothorax, unspecified: Secondary | ICD-10-CM | POA: Diagnosis not present

## 2024-01-11 DIAGNOSIS — Z7982 Long term (current) use of aspirin: Secondary | ICD-10-CM | POA: Diagnosis not present

## 2024-01-11 DIAGNOSIS — Z823 Family history of stroke: Secondary | ICD-10-CM | POA: Diagnosis not present

## 2024-01-11 DIAGNOSIS — Z09 Encounter for follow-up examination after completed treatment for conditions other than malignant neoplasm: Secondary | ICD-10-CM

## 2024-01-11 DIAGNOSIS — J849 Interstitial pulmonary disease, unspecified: Secondary | ICD-10-CM

## 2024-01-11 DIAGNOSIS — I10 Essential (primary) hypertension: Secondary | ICD-10-CM

## 2024-01-11 DIAGNOSIS — K219 Gastro-esophageal reflux disease without esophagitis: Secondary | ICD-10-CM | POA: Diagnosis present

## 2024-01-11 DIAGNOSIS — J4489 Other specified chronic obstructive pulmonary disease: Secondary | ICD-10-CM | POA: Diagnosis not present

## 2024-01-11 DIAGNOSIS — I517 Cardiomegaly: Secondary | ICD-10-CM | POA: Diagnosis not present

## 2024-01-11 DIAGNOSIS — I700 Atherosclerosis of aorta: Secondary | ICD-10-CM | POA: Diagnosis not present

## 2024-01-11 DIAGNOSIS — R000 Tachycardia, unspecified: Secondary | ICD-10-CM | POA: Diagnosis not present

## 2024-01-11 DIAGNOSIS — Z825 Family history of asthma and other chronic lower respiratory diseases: Secondary | ICD-10-CM | POA: Diagnosis not present

## 2024-01-11 DIAGNOSIS — R918 Other nonspecific abnormal finding of lung field: Secondary | ICD-10-CM | POA: Diagnosis not present

## 2024-01-11 DIAGNOSIS — J9811 Atelectasis: Secondary | ICD-10-CM | POA: Diagnosis not present

## 2024-01-11 DIAGNOSIS — Z79899 Other long term (current) drug therapy: Secondary | ICD-10-CM

## 2024-01-11 DIAGNOSIS — E785 Hyperlipidemia, unspecified: Secondary | ICD-10-CM | POA: Diagnosis not present

## 2024-01-11 DIAGNOSIS — R0989 Other specified symptoms and signs involving the circulatory and respiratory systems: Secondary | ICD-10-CM | POA: Diagnosis not present

## 2024-01-11 DIAGNOSIS — R0602 Shortness of breath: Secondary | ICD-10-CM | POA: Diagnosis not present

## 2024-01-11 DIAGNOSIS — Z4682 Encounter for fitting and adjustment of non-vascular catheter: Secondary | ICD-10-CM | POA: Diagnosis not present

## 2024-01-11 DIAGNOSIS — I739 Peripheral vascular disease, unspecified: Secondary | ICD-10-CM | POA: Diagnosis present

## 2024-01-11 HISTORY — PX: VIDEO ASSISTED THORACOSCOPY: SHX5073

## 2024-01-11 HISTORY — PX: LUNG BIOPSY: SHX5088

## 2024-01-11 LAB — ABO/RH: ABO/RH(D): O POS

## 2024-01-11 SURGERY — VIDEO ASSISTED THORACOSCOPY
Anesthesia: General | Site: Chest | Laterality: Right

## 2024-01-11 MED ORDER — CHLORHEXIDINE GLUCONATE 0.12 % MT SOLN: 15.0000 mL | Freq: Once | OROMUCOSAL | Status: AC

## 2024-01-11 MED ORDER — ATORVASTATIN CALCIUM 10 MG PO TABS
20.0000 mg | ORAL_TABLET | Freq: Every day | ORAL | Status: DC
  Administered 2024-01-12 – 2024-01-13 (×2): 20 mg via ORAL
  Filled 2024-01-11 (×2): qty 2

## 2024-01-11 MED ORDER — ORAL CARE MOUTH RINSE: 15.0000 mL | Freq: Once | OROMUCOSAL | Status: AC

## 2024-01-11 MED ORDER — ONDANSETRON HCL 4 MG/2ML IJ SOLN
INTRAMUSCULAR | Status: DC | PRN
  Administered 2024-01-11: 4 mg via INTRAVENOUS

## 2024-01-11 MED ORDER — CEFAZOLIN SODIUM-DEXTROSE 2-4 GM/100ML-% IV SOLN
2.0000 g | Freq: Three times a day (TID) | INTRAVENOUS | Status: AC
  Administered 2024-01-11 – 2024-01-12 (×2): 2 g via INTRAVENOUS
  Filled 2024-01-11 (×2): qty 100

## 2024-01-11 MED ORDER — SODIUM CHLORIDE 0.9 % IV SOLN: INTRAVENOUS | Status: DC | PRN

## 2024-01-11 MED ORDER — MIDAZOLAM HCL 2 MG/2ML IJ SOLN
INTRAMUSCULAR | Status: AC
  Filled 2024-01-11: qty 2

## 2024-01-11 MED ORDER — ENOXAPARIN SODIUM 40 MG/0.4ML IJ SOSY
40.0000 mg | PREFILLED_SYRINGE | Freq: Every day | INTRAMUSCULAR | Status: DC
  Administered 2024-01-12 – 2024-01-13 (×2): 40 mg via SUBCUTANEOUS
  Filled 2024-01-11 (×3): qty 0.4

## 2024-01-11 MED ORDER — ACETAMINOPHEN 160 MG/5ML PO SOLN: 1000.0000 mg | Freq: Four times a day (QID) | ORAL | Status: DC

## 2024-01-11 MED ORDER — ONDANSETRON HCL 4 MG/2ML IJ SOLN
INTRAMUSCULAR | Status: AC
  Filled 2024-01-11: qty 2

## 2024-01-11 MED ORDER — LIDOCAINE 2% (20 MG/ML) 5 ML SYRINGE
INTRAMUSCULAR | Status: AC
  Filled 2024-01-11: qty 5

## 2024-01-11 MED ORDER — SODIUM CHLORIDE 0.45 % IV SOLN
INTRAVENOUS | Status: DC
Start: 2024-01-11 — End: 2024-01-12

## 2024-01-11 MED ORDER — OXYCODONE HCL 5 MG PO TABS
5.0000 mg | ORAL_TABLET | ORAL | Status: DC | PRN
  Administered 2024-01-11 – 2024-01-13 (×6): 10 mg via ORAL
  Filled 2024-01-11 (×6): qty 2

## 2024-01-11 MED ORDER — LIDOCAINE 2% (20 MG/ML) 5 ML SYRINGE
INTRAMUSCULAR | Status: DC | PRN
  Administered 2024-01-11: 40 mg via INTRAVENOUS

## 2024-01-11 MED ORDER — CEFAZOLIN SODIUM-DEXTROSE 2-4 GM/100ML-% IV SOLN
2.0000 g | INTRAVENOUS | Status: AC
  Administered 2024-01-11: 2 g via INTRAVENOUS
  Filled 2024-01-11: qty 100

## 2024-01-11 MED ORDER — ROCURONIUM BROMIDE 10 MG/ML (PF) SYRINGE
PREFILLED_SYRINGE | INTRAVENOUS | Status: AC
  Filled 2024-01-11: qty 10

## 2024-01-11 MED ORDER — FLUTICASONE FUROATE-VILANTEROL 100-25 MCG/ACT IN AEPB
1.0000 | INHALATION_SPRAY | Freq: Every day | RESPIRATORY_TRACT | Status: DC
  Administered 2024-01-12 – 2024-01-13 (×2): 1 via RESPIRATORY_TRACT
  Filled 2024-01-11 (×2): qty 28

## 2024-01-11 MED ORDER — PANTOPRAZOLE SODIUM 40 MG PO TBEC
40.0000 mg | DELAYED_RELEASE_TABLET | Freq: Every day | ORAL | Status: DC
  Administered 2024-01-12 – 2024-01-13 (×2): 40 mg via ORAL
  Filled 2024-01-11 (×2): qty 1

## 2024-01-11 MED ORDER — ACETAMINOPHEN 500 MG PO TABS
1000.0000 mg | ORAL_TABLET | Freq: Four times a day (QID) | ORAL | Status: DC
  Administered 2024-01-11 – 2024-01-13 (×7): 1000 mg via ORAL
  Filled 2024-01-11 (×7): qty 2

## 2024-01-11 MED ORDER — LACTATED RINGERS IV SOLN: INTRAVENOUS | Status: DC

## 2024-01-11 MED ORDER — KETOROLAC TROMETHAMINE 15 MG/ML IJ SOLN
15.0000 mg | Freq: Four times a day (QID) | INTRAMUSCULAR | Status: DC
  Administered 2024-01-11 – 2024-01-13 (×7): 15 mg via INTRAVENOUS
  Filled 2024-01-11 (×7): qty 1

## 2024-01-11 MED ORDER — LUNG SURGERY BOOK
Freq: Once | Status: AC
  Filled 2024-01-11: qty 1

## 2024-01-11 MED ORDER — MIDAZOLAM HCL 2 MG/2ML IJ SOLN
INTRAMUSCULAR | Status: DC | PRN
  Administered 2024-01-11: 2 mg via INTRAVENOUS

## 2024-01-11 MED ORDER — CHLORHEXIDINE GLUCONATE 0.12 % MT SOLN
OROMUCOSAL | Status: AC
  Administered 2024-01-11: 15 mL via OROMUCOSAL
  Filled 2024-01-11: qty 15

## 2024-01-11 MED ORDER — ACETAMINOPHEN 500 MG PO TABS
1000.0000 mg | ORAL_TABLET | Freq: Once | ORAL | Status: AC
  Administered 2024-01-11: 1000 mg via ORAL
  Filled 2024-01-11: qty 2

## 2024-01-11 MED ORDER — BUDESON-GLYCOPYRROL-FORMOTEROL 160-9-4.8 MCG/ACT IN AERO: 2.0000 | INHALATION_SPRAY | Freq: Two times a day (BID) | RESPIRATORY_TRACT | Status: DC

## 2024-01-11 MED ORDER — SUGAMMADEX SODIUM 200 MG/2ML IV SOLN
INTRAVENOUS | Status: DC | PRN
  Administered 2024-01-11: 300 mg via INTRAVENOUS

## 2024-01-11 MED ORDER — PHENYLEPHRINE 80 MCG/ML (10ML) SYRINGE FOR IV PUSH (FOR BLOOD PRESSURE SUPPORT)
PREFILLED_SYRINGE | INTRAVENOUS | Status: AC
  Filled 2024-01-11: qty 10

## 2024-01-11 MED ORDER — LABETALOL HCL 5 MG/ML IV SOLN
INTRAVENOUS | Status: AC
  Filled 2024-01-11: qty 4

## 2024-01-11 MED ORDER — LABETALOL HCL 5 MG/ML IV SOLN
5.0000 mg | Freq: Once | INTRAVENOUS | Status: AC
  Administered 2024-01-11: 5 mg via INTRAVENOUS

## 2024-01-11 MED ORDER — FENTANYL CITRATE (PF) 100 MCG/2ML IJ SOLN
INTRAMUSCULAR | Status: AC
  Filled 2024-01-11: qty 2

## 2024-01-11 MED ORDER — AMISULPRIDE (ANTIEMETIC) 5 MG/2ML IV SOLN: 10.0000 mg | Freq: Once | INTRAVENOUS | Status: DC | PRN

## 2024-01-11 MED ORDER — FENTANYL CITRATE (PF) 250 MCG/5ML IJ SOLN
INTRAMUSCULAR | Status: AC
  Filled 2024-01-11: qty 5

## 2024-01-11 MED ORDER — ALBUTEROL SULFATE (2.5 MG/3ML) 0.083% IN NEBU: 2.5000 mg | INHALATION_SOLUTION | RESPIRATORY_TRACT | Status: DC | PRN

## 2024-01-11 MED ORDER — UMECLIDINIUM BROMIDE 62.5 MCG/ACT IN AEPB
1.0000 | INHALATION_SPRAY | Freq: Every day | RESPIRATORY_TRACT | Status: DC
  Administered 2024-01-12 – 2024-01-13 (×2): 1 via RESPIRATORY_TRACT
  Filled 2024-01-11 (×2): qty 7

## 2024-01-11 MED ORDER — SODIUM CHLORIDE (PF) 0.9 % IJ SOLN: INTRAMUSCULAR | Status: DC | PRN

## 2024-01-11 MED ORDER — PROPOFOL 10 MG/ML IV BOLUS
INTRAVENOUS | Status: AC
  Filled 2024-01-11: qty 20

## 2024-01-11 MED ORDER — 0.9 % SODIUM CHLORIDE (POUR BTL) OPTIME
TOPICAL | Status: DC | PRN
  Administered 2024-01-11: 2000 mL

## 2024-01-11 MED ORDER — HEMOSTATIC AGENTS (NO CHARGE) OPTIME
TOPICAL | Status: DC | PRN
  Administered 2024-01-11: 1 via TOPICAL

## 2024-01-11 MED ORDER — BUDESONIDE 0.25 MG/2ML IN SUSP
0.2500 mg | Freq: Two times a day (BID) | RESPIRATORY_TRACT | Status: DC
  Administered 2024-01-11 – 2024-01-12 (×3): 0.25 mg via RESPIRATORY_TRACT
  Filled 2024-01-11 (×3): qty 2

## 2024-01-11 MED ORDER — DEXAMETHASONE SODIUM PHOSPHATE 10 MG/ML IJ SOLN
INTRAMUSCULAR | Status: DC | PRN
  Administered 2024-01-11: 10 mg via INTRAVENOUS

## 2024-01-11 MED ORDER — ORAL CARE MOUTH RINSE: 15.0000 mL | OROMUCOSAL | Status: DC | PRN

## 2024-01-11 MED ORDER — BISACODYL 5 MG PO TBEC
10.0000 mg | DELAYED_RELEASE_TABLET | Freq: Every day | ORAL | Status: DC
  Administered 2024-01-11 – 2024-01-13 (×3): 10 mg via ORAL
  Filled 2024-01-11 (×3): qty 2

## 2024-01-11 MED ORDER — MONTELUKAST SODIUM 10 MG PO TABS
10.0000 mg | ORAL_TABLET | Freq: Every day | ORAL | Status: DC
Start: 2024-01-11 — End: 2024-01-13
  Administered 2024-01-11 – 2024-01-12 (×2): 10 mg via ORAL
  Filled 2024-01-11 (×2): qty 1

## 2024-01-11 MED ORDER — MUPIROCIN 2 % EX OINT
1.0000 | TOPICAL_OINTMENT | Freq: Two times a day (BID) | CUTANEOUS | Status: DC
  Administered 2024-01-11 – 2024-01-13 (×4): 1 via NASAL
  Filled 2024-01-11 (×2): qty 22

## 2024-01-11 MED ORDER — FENTANYL CITRATE (PF) 250 MCG/5ML IJ SOLN
INTRAMUSCULAR | Status: DC | PRN
  Administered 2024-01-11: 50 ug via INTRAVENOUS
  Administered 2024-01-11: 100 ug via INTRAVENOUS
  Administered 2024-01-11: 50 ug via INTRAVENOUS

## 2024-01-11 MED ORDER — FENTANYL CITRATE (PF) 100 MCG/2ML IJ SOLN
25.0000 ug | INTRAMUSCULAR | Status: DC | PRN
  Administered 2024-01-11 (×3): 50 ug via INTRAVENOUS

## 2024-01-11 MED ORDER — TRAMADOL HCL 50 MG PO TABS
50.0000 mg | ORAL_TABLET | Freq: Four times a day (QID) | ORAL | Status: DC | PRN
Start: 2024-01-11 — End: 2024-01-13

## 2024-01-11 MED ORDER — ROCURONIUM BROMIDE 10 MG/ML (PF) SYRINGE
PREFILLED_SYRINGE | INTRAVENOUS | Status: DC | PRN
  Administered 2024-01-11: 60 mg via INTRAVENOUS
  Administered 2024-01-11 (×2): 20 mg via INTRAVENOUS

## 2024-01-11 MED ORDER — BUPIVACAINE LIPOSOME 1.3 % IJ SUSP
INTRAMUSCULAR | Status: DC | PRN
  Administered 2024-01-11: 86 mL

## 2024-01-11 MED ORDER — ONDANSETRON HCL 4 MG/2ML IJ SOLN: 4.0000 mg | Freq: Four times a day (QID) | INTRAMUSCULAR | Status: DC | PRN

## 2024-01-11 MED ORDER — GABAPENTIN 300 MG PO CAPS
300.0000 mg | ORAL_CAPSULE | Freq: Every day | ORAL | Status: DC
  Administered 2024-01-11 – 2024-01-12 (×2): 300 mg via ORAL
  Filled 2024-01-11 (×2): qty 1

## 2024-01-11 MED ORDER — BUPIVACAINE LIPOSOME 1.3 % IJ SUSP
INTRAMUSCULAR | Status: AC
  Filled 2024-01-11: qty 20

## 2024-01-11 MED ORDER — CHLORHEXIDINE GLUCONATE CLOTH 2 % EX PADS
6.0000 | MEDICATED_PAD | Freq: Every day | CUTANEOUS | Status: DC
  Administered 2024-01-12 – 2024-01-13 (×2): 6 via TOPICAL

## 2024-01-11 MED ORDER — BUPIVACAINE HCL (PF) 0.5 % IJ SOLN
INTRAMUSCULAR | Status: AC
  Filled 2024-01-11: qty 30

## 2024-01-11 MED ORDER — GABAPENTIN 300 MG PO CAPS: 300.0000 mg | ORAL_CAPSULE | Freq: Two times a day (BID) | ORAL | Status: DC

## 2024-01-11 MED ORDER — DEXAMETHASONE SODIUM PHOSPHATE 10 MG/ML IJ SOLN
INTRAMUSCULAR | Status: AC
  Filled 2024-01-11: qty 1

## 2024-01-11 MED ORDER — MORPHINE SULFATE (PF) 2 MG/ML IV SOLN
2.0000 mg | INTRAVENOUS | Status: DC | PRN
  Administered 2024-01-11: 2 mg via INTRAVENOUS
  Filled 2024-01-11: qty 1

## 2024-01-11 MED ORDER — SENNOSIDES-DOCUSATE SODIUM 8.6-50 MG PO TABS
1.0000 | ORAL_TABLET | Freq: Every day | ORAL | Status: DC
  Administered 2024-01-11 – 2024-01-12 (×2): 1 via ORAL
  Filled 2024-01-11 (×2): qty 1

## 2024-01-11 MED ORDER — PROPOFOL 10 MG/ML IV BOLUS
INTRAVENOUS | Status: DC | PRN
  Administered 2024-01-11: 50 mg via INTRAVENOUS
  Administered 2024-01-11: 150 mg via INTRAVENOUS

## 2024-01-11 MED ORDER — BUPIVACAINE HCL (PF) 0.5 % IJ SOLN: INTRAMUSCULAR | Status: DC | PRN

## 2024-01-11 SURGICAL SUPPLY — 86 items
ANTIFOG SOL W/FOAM PAD STRL (MISCELLANEOUS) ×2 IMPLANT
APPLICATOR COTTON TIP 6 STRL (MISCELLANEOUS) IMPLANT
APPLICATOR COTTON TIP 6IN STRL (MISCELLANEOUS) IMPLANT
APPLIER CLIP ROT 10 11.4 M/L (STAPLE) IMPLANT
BLADE CLIPPER SURG (BLADE) ×2 IMPLANT
CANISTER SUCT 3000ML PPV (MISCELLANEOUS) ×2 IMPLANT
CATH THORACIC 28FR (CATHETERS) IMPLANT
CATH THORACIC 28FR RT ANG (CATHETERS) IMPLANT
CATH THORACIC 36FR (CATHETERS) IMPLANT
CATH THORACIC 36FR RT ANG (CATHETERS) IMPLANT
CLIP APPLIE ROT 10 11.4 M/L (STAPLE) IMPLANT
CLIP TI MEDIUM 24 (CLIP) IMPLANT
CLIP TI MEDIUM 6 (CLIP) IMPLANT
CNTNR URN SCR LID CUP LEK RST (MISCELLANEOUS) ×4 IMPLANT
CONN Y 3/8X3/8X3/8 BEN (MISCELLANEOUS) ×2 IMPLANT
COVER SURGICAL LIGHT HANDLE (MISCELLANEOUS) ×2 IMPLANT
CUTTER ECHEON FLEX ENDO 45 340 (ENDOMECHANICALS) IMPLANT
DERMABOND ADVANCED .7 DNX12 (GAUZE/BANDAGES/DRESSINGS) IMPLANT
DRAIN CHANNEL 28F RND 3/8 FF (WOUND CARE) IMPLANT
DRAIN CHANNEL 32F RND 10.7 FF (WOUND CARE) IMPLANT
DRAPE CV SPLIT W-CLR ANES SCRN (DRAPES) ×2 IMPLANT
DRAPE SURG ORHT 6 SPLT 77X108 (DRAPES) ×2 IMPLANT
DRAPE WARM FLUID 44X44 (DRAPES) ×2 IMPLANT
ELECT BLADE 6.5 EXT (BLADE) ×2 IMPLANT
ELECT REM PT RETURN 9FT ADLT (ELECTROSURGICAL) ×2 IMPLANT
ELECTRODE REM PT RTRN 9FT ADLT (ELECTROSURGICAL) ×2 IMPLANT
GAUZE 4X4 16PLY ~~LOC~~+RFID DBL (SPONGE) ×2 IMPLANT
GAUZE SPONGE 4X4 12PLY STRL (GAUZE/BANDAGES/DRESSINGS) ×2 IMPLANT
GLOVE SS BIOGEL STRL SZ 7.5 (GLOVE) ×2 IMPLANT
GLOVE SURG MICRO LTX SZ7.5 (GLOVE) ×2 IMPLANT
GLOVE SURG SIGNA 7.5 PF LTX (GLOVE) ×4 IMPLANT
GOWN STRL REUS W/ TWL LRG LVL3 (GOWN DISPOSABLE) ×4 IMPLANT
GOWN STRL REUS W/ TWL XL LVL3 (GOWN DISPOSABLE) ×2 IMPLANT
HEMOSTAT SURGICEL 2X14 (HEMOSTASIS) IMPLANT
IV CATH 22GX1 FEP (IV SOLUTION) IMPLANT
KIT BASIN OR (CUSTOM PROCEDURE TRAY) ×2 IMPLANT
KIT SUCTION CATH 14FR (SUCTIONS) ×2 IMPLANT
KIT TURNOVER KIT B (KITS) ×2 IMPLANT
NDL HYPO 25GX1X1/2 BEV (NEEDLE) ×2 IMPLANT
NEEDLE HYPO 25GX1X1/2 BEV (NEEDLE) ×2 IMPLANT
NS IRRIG 1000ML POUR BTL (IV SOLUTION) ×4 IMPLANT
PACK CHEST (CUSTOM PROCEDURE TRAY) ×2 IMPLANT
PAD ARMBOARD 7.5X6 YLW CONV (MISCELLANEOUS) ×4 IMPLANT
POUCH ENDO CATCH II 15MM (MISCELLANEOUS) IMPLANT
RELOAD STAPLE 45 3.6 BLU REG (STAPLE) IMPLANT
RELOAD STAPLE 45 4.1 GRN THCK (STAPLE) IMPLANT
SEALANT PROGEL (MISCELLANEOUS) IMPLANT
SEALANT SURG COSEAL 4ML (VASCULAR PRODUCTS) IMPLANT
SEALANT SURG COSEAL 8ML (VASCULAR PRODUCTS) IMPLANT
SOL ANTI FOG 6CC (MISCELLANEOUS) ×2 IMPLANT
SOLUTION ANTFG W/FOAM PAD STRL (MISCELLANEOUS) IMPLANT
SPECIMEN JAR MEDIUM (MISCELLANEOUS) ×2 IMPLANT
SPONGE INTESTINAL PEANUT (DISPOSABLE) IMPLANT
SPONGE T-LAP 18X18 ~~LOC~~+RFID (SPONGE) ×8 IMPLANT
SPONGE T-LAP 4X18 ~~LOC~~+RFID (SPONGE) ×2 IMPLANT
SPONGE TONSIL 1 RF SGL (DISPOSABLE) ×2 IMPLANT
STAPLE RELOAD 45 GRN (STAPLE) ×12 IMPLANT
STAPLE RELOAD 45MM BLUE (STAPLE) ×4 IMPLANT
STOPCOCK 4 WAY LG BORE MALE ST (IV SETS) ×2 IMPLANT
SUT PROLENE 4-0 RB1 .5 CRCL 36 (SUTURE) IMPLANT
SUT SILK 1 MH (SUTURE) ×4 IMPLANT
SUT SILK 2 0SH CR/8 30 (SUTURE) IMPLANT
SUT SILK 3 0SH CR/8 30 (SUTURE) IMPLANT
SUT VIC AB 1 CTX36XBRD ANBCTR (SUTURE) IMPLANT
SUT VIC AB 2-0 CTX 36 (SUTURE) IMPLANT
SUT VIC AB 2-0 UR6 27 (SUTURE) IMPLANT
SUT VIC AB 3-0 MH 27 (SUTURE) IMPLANT
SUT VIC AB 3-0 X1 27 (SUTURE) ×2 IMPLANT
SUT VICRYL 2 TP 1 (SUTURE) IMPLANT
SYR 10ML LL (SYRINGE) ×2 IMPLANT
SYR 20ML LL LF (SYRINGE) IMPLANT
SYR 50ML LL SCALE MARK (SYRINGE) ×2 IMPLANT
SYS BAG RETRIEVAL 10MM (BASKET) IMPLANT
SYSTEM BAG RETRIEVAL 10MM (BASKET) IMPLANT
SYSTEM SAHARA CHEST DRAIN ATS (WOUND CARE) ×2 IMPLANT
TAPE CLOTH 4X10 WHT NS (GAUZE/BANDAGES/DRESSINGS) ×2 IMPLANT
TAPE PAPER 2X10 WHT MICROPORE (GAUZE/BANDAGES/DRESSINGS) IMPLANT
TIP APPLICATOR SPRAY EXTEND 16 (VASCULAR PRODUCTS) IMPLANT
TOWEL GREEN STERILE (TOWEL DISPOSABLE) ×2 IMPLANT
TOWEL GREEN STERILE FF (TOWEL DISPOSABLE) ×2 IMPLANT
TRAY FOLEY MTR SLVR 16FR STAT (SET/KITS/TRAYS/PACK) ×2 IMPLANT
TROCAR XCEL BLADELESS 5X75MML (TROCAR) ×2 IMPLANT
TROCAR XCEL NON-BLD 5MMX100MML (ENDOMECHANICALS) IMPLANT
TUBE CONNECTING 20X1/4 (TUBING) IMPLANT
TUBING EXTENTION W/L.L. (IV SETS) ×2 IMPLANT
WATER STERILE IRR 1000ML POUR (IV SOLUTION) ×4 IMPLANT

## 2024-01-11 NOTE — Hospital Course (Signed)
HPI: [name] is sent for consultation for a lung biopsy for interstitial lung disease.   Mark Doyle is a 68 year old man with a history of asthma, COPD, hyperlipidemia, coronary calcification and interstitial lung disease.  He had asthma as a child.  Said he started noticing shortness of breath with exertion about 6 years ago but was mild initially.  Over the past 3 years has had progressive worsening of his shortness of breath.  He is never required home oxygen.  Gets short walking to his mailbox and back which is a total distance of about 50 yards.  Gets short of breath with walking up a flight of stairs.   He is a lifelong non-smoker.  Does have significant coronary calcification.  Saw Dr. Bing Matter over a year ago and had a stress test which showed no evidence of ischemia.  Dr. Dorris Fetch reviewed the patient's diagnostic studies and determined he would benefit from surgical intervention. He reviewed the treatment options as well as the risks and benefits of surgery with the patient. [name] was agreeable to proceed with surgery.    Hospital Course: [name] presented to [facility] and underwent Right VATS with right lower lobe and right middle lobe biopsy. He tolerated the procedure well and was transferred to the PACU in stable condition.

## 2024-01-11 NOTE — Interval H&P Note (Signed)
History and Physical Interval Note:  01/11/2024 12:48 PM  Mark Doyle  has presented today for surgery, with the diagnosis of Interstitial Lung Disease.  The various methods of treatment have been discussed with the patient and family. After consideration of risks, benefits and other options for treatment, the patient has consented to  Procedure(s): VIDEO ASSISTED THORACOSCOPY (Right) LUNG BIOPSY (Right) as a surgical intervention.  The patient's history has been reviewed, patient examined, no change in status, stable for surgery.  I have reviewed the patient's chart and labs.  Questions were answered to the patient's satisfaction.     Loreli Slot

## 2024-01-11 NOTE — Op Note (Signed)
NAMELYONEL, MOREJON MEDICAL RECORD NO: [id number] ACCOUNT NO: [id number] DATE OF BIRTH: [birth year] FACILITY: [facility] LOCATION: [facility] PHYSICIAN: Salvatore Decent. Dorris Fetch, MD  Operative Report   DATE OF PROCEDURE: 01/11/2024  PREOPERATIVE DIAGNOSIS:  Interstitial lung disease.  POSTOPERATIVE DIAGNOSIS:  Interstitial lung disease.  PROCEDURE:  Right video-assisted thoracoscopy, lung biopsy, intercostal nerve blocks levels 4 through 10.  SURGEON:  Salvatore Decent. Dorris Fetch, MD  ASSISTANT:  Aloha Gell, PA  Experienced assistance was necessary for this case due to surgical complexity.  Aloha Gell assisted with camera management, retraction of delicate tissues, suctioning, and wound closure.  ANESTHESIA:  General.  FINDINGS:  Abnormal-appearing lung at inferior aspect of right lower lobe and inferior aspect of right middle lobe.  CLINICAL NOTE: Mr. Goodchild is a 68 year old man with interstitial lung disease who has had progressively worsening shortness of breath and decrease in his pulmonary function testing.  He was referred for surgical lung biopsy for diagnosis of his interstitial lung disease.  The indications, risks, benefits, and alternatives were discussed in detail with the patient.  He understood this was diagnostic and not therapeutic.  He accepted the risks and agreed to proceed.  OPERATIVE NOTE: Mr. Lema was brought to the operating room on 01/11/2024.  He had induction of general anesthesia and was intubated with a double lumen endotracheal tube.  Intravenous antibiotics were administered.  A Foley catheter was placed.  Sequential compression devices were placed on the calves for DVT prophylaxis.  He was placed in a left lateral decubitus position.  A Bair Hugger was placed for active warming.  The right chest was prepped and draped in the usual sterile fashion.  Single lung ventilation of the left lung was initiated and was tolerated well throughout the procedure.  A timeout was  performed.  A solution containing 20 mL of liposomal bupivacaine, 30 mL of 0.5% bupivacaine and 50 mL of saline was prepared.  This solution was used for local at the incision sites as well as for the intercostal nerve blocks.  An incision was made in the eighth intercostal space.  A 5-mm port was placed into the chest and the thoracoscope was advanced into the chest.  There was good isolation of the right lung, although it was relatively slow to deflate.  A working mini thoracotomy incision was performed in the fifth interspace anterolaterally.  No rib spreading was performed during the procedure.  Initially, there was not sufficient visualization to allow for the nerve blocks.  The posterior inferior aspect of the right lower lobe appeared to be the most heavily involved area.  A biopsy was performed of this area.  The biopsy was performed as a wedge resection using sequential firings of an Eth[employer] powered stapler using green and blue cartridges.  An abnormal but less scarred appearing portion of the lower lobe laterally was also biopsied and finally a piece of the middle lobe was biopsied.  Small fragments of the middle lobe and the first lower lobe biopsy were sent for AFB and fungal cultures.  The remainder was sent for permanent pathology.  By this point, the lung had deflated enough and the intercostal nerve blocks were performed by injecting 10 mL of the bupivacaine solution into a subpleural plane at each level from the 4th to the 10th interspace.  There was  good hemostasis at the incision and the port site as well as at the staple lines.  A 28-French chest tube was placed through the original port incision and secured with a #  1 silk suture.  Dual lung ventilation was resumed.  The working incision was closed in standard fashion.  The chest tube was placed to a Pleura-Evac on water seal.  The patient then was placed back in the supine position.  He was extubated in the operating room and taken to the  post-anesthetic care unit in good condition.  All sponge, needle, and instrument counts were correct at the end of the procedure.   PUS D: 01/11/2024 5:20:04 pm T: 01/11/2024 5:50:00 pm  JOB: [id number]/ [id number]

## 2024-01-11 NOTE — Transfer of Care (Signed)
Immediate Anesthesia Transfer of Care Note  Patient: Mark Doyle  Procedure(s) Performed: VIDEO ASSISTED THORACOSCOPY (Right: Chest) LUNG BIOPSY (Right)  Patient Location: PACU  Anesthesia Type:General  Level of Consciousness: awake, alert , and oriented  Airway & Oxygen Therapy: Patient Spontanous Breathing and Patient connected to face mask oxygen  Post-op Assessment: Report given to RN and Post -op Vital signs reviewed and stable  Post vital signs: Reviewed and stable  Last Vitals:  Vitals Value Taken Time  BP 174/90 01/11/24 1507  Temp    Pulse 87 01/11/24 1510  Resp 19 01/11/24 1510  SpO2 91 % 01/11/24 1510  Vitals shown include unfiled device data.  Last Pain:  Vitals:   01/11/24 1115  TempSrc:   PainSc: 0-No pain         Complications: No notable events documented.

## 2024-01-11 NOTE — Brief Op Note (Signed)
01/11/2024  2:51 PM  PATIENT:  Keturah Barre  68 y.o. male  PRE-OPERATIVE DIAGNOSIS:  Interstitial Lung Disease  POST-OPERATIVE DIAGNOSIS:  Interstitial Lung Disease  PROCEDURE:  VIDEO ASSISTED THORACOSCOPY (Right) LUNG BIOPSY (Right)  SURGEON:  Surgeons and Role:    Loreli Slot, MD - Primary  PHYSICIAN ASSISTANT: Aloha Gell   ASSISTANTS: none   ANESTHESIA:   local and general  EBL:  10 mL   BLOOD ADMINISTERED:none  DRAINS:  right pleural tube    LOCAL MEDICATIONS USED:  OTHER exparel  SPECIMEN:  Source of Specimen:  Right lower lobe and right middle lobe wedge biopsy  DISPOSITION OF SPECIMEN:   pathology, fungal  COUNTS:  YES  DICTATION: .Dragon Dictation  PLAN OF CARE: Admit to inpatient   PATIENT DISPOSITION:  PACU - hemodynamically stable.   Delay start of Pharmacological VTE agent (>24hrs) due to surgical blood loss or risk of bleeding: no

## 2024-01-11 NOTE — Anesthesia Procedure Notes (Signed)
Procedure Name: Intubation Date/Time: 01/11/2024 1:10 PM  Performed by: Little Ishikawa, CRNAPre-anesthesia Checklist: Patient identified, Emergency Drugs available, Suction available, Timeout performed and Patient being monitored Patient Re-evaluated:Patient Re-evaluated prior to induction Oxygen Delivery Method: Circle system utilized Preoxygenation: Pre-oxygenation with 100% oxygen Induction Type: IV induction Ventilation: Mask ventilation without difficulty Laryngoscope Size: Mac and 4 Grade View: Grade I Tube type: Oral Endobronchial tube: Left, Double lumen EBT, EBT position confirmed by auscultation and EBT position confirmed by fiberoptic bronchoscope and 41 Fr Airway Equipment and Method: Stylet Placement Confirmation: ETT inserted through vocal cords under direct vision, positive ETCO2, CO2 detector and breath sounds checked- equal and bilateral Secured at: 28 cm Tube secured with: Tape Dental Injury: Teeth and Oropharynx as per pre-operative assessment

## 2024-01-12 ENCOUNTER — Inpatient Hospital Stay (HOSPITAL_COMMUNITY): Payer: 59

## 2024-01-12 ENCOUNTER — Encounter (HOSPITAL_COMMUNITY): Payer: Self-pay | Admitting: Thoracic Surgery (Cardiothoracic Vascular Surgery)

## 2024-01-12 ENCOUNTER — Ambulatory Visit: Payer: 59 | Admitting: Internal Medicine

## 2024-01-12 DIAGNOSIS — Z09 Encounter for follow-up examination after completed treatment for conditions other than malignant neoplasm: Secondary | ICD-10-CM

## 2024-01-12 LAB — BASIC METABOLIC PANEL
Anion gap: 9 (ref 5–15)
BUN: 12 mg/dL (ref 8–23)
CO2: 23 mmol/L (ref 22–32)
Calcium: 8.2 mg/dL — ABNORMAL LOW (ref 8.9–10.3)
Chloride: 99 mmol/L (ref 98–111)
Creatinine, Ser: 1.01 mg/dL (ref 0.61–1.24)
GFR, Estimated: >60 mL/min (ref >60)
Glucose, Bld: 146 mg/dL — ABNORMAL HIGH (ref 70–99)
Potassium: 4.4 mmol/L (ref 3.5–5.1)
Sodium: 131 mmol/L — ABNORMAL LOW (ref 135–145)

## 2024-01-12 LAB — CBC
HCT: 40.5 % (ref 39.0–52.0)
Hemoglobin: 13.8 g/dL (ref 13.0–17.0)
MCH: 33.9 pg (ref 26.0–34.0)
MCHC: 34.1 g/dL (ref 30.0–36.0)
MCV: 99.5 fL (ref 80.0–100.0)
Platelets: 223 10*3/uL (ref 150–400)
RBC: 4.07 MIL/uL — ABNORMAL LOW (ref 4.22–5.81)
RDW: 12.6 % (ref 11.5–15.5)
WBC: 12.1 10*3/uL — ABNORMAL HIGH (ref 4.0–10.5)
nRBC: 0 % (ref 0.0–0.2)

## 2024-01-12 NOTE — Progress Notes (Signed)
   01/12/24 1546  SDOH Interventions  Utilities Interventions Inpatient TOC   Resources added to AVS

## 2024-01-12 NOTE — Progress Notes (Addendum)
      [address]       [place] [address]             [phone]      1 Day Post-Op Procedure(s) (LRB): VIDEO ASSISTED THORACOSCOPY (Right) LUNG BIOPSY (Right)  Subjective:  Patient doing okay.  Pain is well controlled with medication.  Denies N/V.  Objective: Vital signs in last 24 hours: Temp:  [97.6 F (36.4 C)-98.5 F (36.9 C)] 97.6 F (36.4 C) (01/21 0241) Pulse Rate:  [67-108] 78 (01/21 0241) Cardiac Rhythm: Normal sinus rhythm (01/21 0721) Resp:  [11-18] 17 (01/21 0241) BP: (136-174)/(88-118) 140/88 (01/21 0241) SpO2:  [92 %-100 %] 96 % (01/21 0348) Weight:  [108.9 kg-110.5 kg] 110.5 kg (01/20 1604)  Intake/Output from previous day: 01/20 0701 - 01/21 0700 In: 3019.7 [P.O.:960; I.V.:1859.7; IV Piggyback:200] Out: 690 [Urine:650; Blood:10; Chest Tube:30]   General appearance: alert, cooperative, and no distress Heart: regular rate and rhythm Lungs: clear to auscultation bilaterally Abdomen: soft, non-tender; bowel sounds normal; no masses,  no organomegaly Wound: clean and dry  Lab Results: Recent Labs    01/12/24 0210  WBC 12.1*  HGB 13.8  HCT 40.5  PLT 223   BMET:  Recent Labs    01/12/24 0210  NA 131*  K 4.4  CL 99  CO2 23  GLUCOSE 146*  BUN 12  CREATININE 1.01  CALCIUM 8.2*    PT/INR: No results for input(s): "LABPROT", "INR" in the last 72 hours. ABG No results found for: "PHART", "HCO3", "TCO2", "ACIDBASEDEF", "O2SAT" CBG (last 3)  No results for input(s): "GLUCAP" in the last 72 hours.  Assessment/Plan: S/P Procedure(s) (LRB): VIDEO ASSISTED THORACOSCOPY (Right) LUNG BIOPSY (Right)  CV- Sinus Tach, + HTN- no documented history of blood pressure issues, continue prn medications for now if persists will start low dose hydrochlorothiazide and he will require follow up with PCP Pulm- CT on water seal, no air leak present, minimal output.Marland Kitchen CXR w/o pneumothorax.. will d/c chest tube today Renal- creatinine remains  normal, tolerating oral intake, stop IV fluids Lovenox/scds for DVT prophylaxis Dispo- patient stable, d/c chest tube today.. if remains stable will plan for d/c in AM   LOS: 1 day    Lowella Dandy, PA-C 01/12/2024  Patient seen and examined, agree with above Dc chest tube Ambulate  Viviann Spare C. Dorris Fetch, MD [facility] [phone]

## 2024-01-12 NOTE — Discharge Summary (Addendum)
Physician Discharge Summary  Patient ID: Mark Doyle MRN: [id number] DOB/AGE: 68-Apr-1957 68 y.o.  Admit date: 01/11/2024 Discharge date: 01/13/2024  Admission Diagnoses:  Patient Active Problem List   Diagnosis Date Noted   Interstitial lung disease (HCC) 01/11/2024   Preop cardiovascular exam 01/01/2024   Seasonal and perennial allergic rhinitis 12/25/2023   Cortical age-related cataract of both eyes 03/09/2023   Early dry stage nonexudative age-related macular degeneration of both eyes 03/09/2023   Myopia of both eyes with astigmatism and presbyopia 03/09/2023   Nuclear sclerotic cataract of both eyes 03/09/2023   ILD (interstitial lung disease) (HCC) 02/23/2023   Obstructive lung disease (HCC) 11/25/2022   Coronary artery calcification 11/25/2022   Dyspnea on exertion 08/02/2021   Environmental allergies 08/01/2021   Asthma 08/01/2021   Elevated LFTs 11/08/2019   Mild intermittent asthma without complication 11/08/2019   Pure hypercholesterolemia 11/08/2019   Discharge Diagnoses:   Patient Active Problem List   Diagnosis Date Noted   s/p Right Video Assisted Thoracoscopy with Lung Biopsy 01/12/2024   Interstitial lung disease (HCC) 01/11/2024   Preop cardiovascular exam 01/01/2024   Seasonal and perennial allergic rhinitis 12/25/2023   Cortical age-related cataract of both eyes 03/09/2023   Early dry stage nonexudative age-related macular degeneration of both eyes 03/09/2023   Myopia of both eyes with astigmatism and presbyopia 03/09/2023   Nuclear sclerotic cataract of both eyes 03/09/2023   ILD (interstitial lung disease) (HCC) 02/23/2023   Obstructive lung disease (HCC) 11/25/2022   Coronary artery calcification 11/25/2022   Dyspnea on exertion 08/02/2021   Environmental allergies 08/01/2021   Asthma 08/01/2021   Elevated LFTs 11/08/2019   Mild intermittent asthma without complication 11/08/2019   Pure hypercholesterolemia 11/08/2019   Discharged Condition:  good  HPI: Mark Doyle is sent for consultation for a lung biopsy for interstitial lung disease.   Mark Doyle is a 68 year old man with a history of asthma, COPD, hyperlipidemia, coronary calcification and interstitial lung disease.  He had asthma as a child.  Said he started noticing shortness of breath with exertion about 6 years ago but was mild initially.  Over the past 3 years has had progressive worsening of his shortness of breath.  He is never required home oxygen.  Gets short walking to his mailbox and back which is a total distance of about 50 yards.  Gets short of breath with walking up a flight of stairs.   He is a lifelong non-smoker.  Does have significant coronary calcification.  Saw Dr. Bing Matter over a year ago and had a stress test which showed no evidence of ischemia.  Dr. Dorris Fetch reviewed the patient's diagnostic studies and determined he would benefit from surgical intervention. He reviewed the treatment options as well as the risks and benefits of surgery with the patient. Mark Doyle was agreeable to proceed with surgery.    Hospital Course: Mark Doyle presented to [facility] and underwent Right VATS with right lower lobe and right middle lobe biopsy. He tolerated the procedure well and was transferred to the PACU in stable condition.  The patient has done well post operatively.  His chest tube remained on water seal without evidence of air leak.  Chest xray showed no pleural effusion or pneumothorax.  His chest tube was removed on POD #1.  Follow up CXR showed no evidence of pneumothorax.  He was noted to be mildly hypoxic with ambulation and home oxygen was arranged.  He was mildly hypertensive and tachycardic.  He had no  documented history of hypertension.  This was closely monitored.  He will follow up with his PCP.  He is ambulating without difficulty.  His surgical incisions are healing without evidence of infection.  He is stable for discharge home  today.  Consults: None  Significant Diagnostic Studies: radiology:  CT scan:    FINDINGS: Cardiovascular: Atherosclerotic calcification of the aorta and coronary arteries. Ascending aorta measures up to 4.0 cm (coronal image 95), stable. Heart is enlarged. No pericardial effusion.   Mediastinum/Nodes: No pathologically enlarged mediastinal or axillary lymph nodes. Hilar regions are difficult to definitively evaluate without IV contrast. Esophagus is grossly unremarkable.   Lungs/Pleura: Pleuroparenchymal scarring in the posterior lower right hemithorax. Superimposed mild subpleural reticular densities and ground-glass in the lateral and posterior aspects of the mid and lower lung zones, as on 05/28/2023. Probable minimal bronchiolectasis. No honeycombing. Calcified granulomas. No pleural fluid. Airway is unremarkable. There is air trapping.   Upper Abdomen: Liver may be slightly decreased in attenuation diffusely. Cholecystectomy. Visualized portions of the liver, adrenal glands, kidneys, spleen, pancreas, stomach and bowel are otherwise grossly unremarkable. No upper abdominal adenopathy.   Musculoskeletal: Degenerative changes in the spine. Old right rib fractures.   IMPRESSION: 1. Mild peripheral and basilar predominant subpleural reticulation and ground-glass, indicative of interstitial lung disease such as fibrotic nonspecific interstitial pneumonitis or usual interstitial pneumonitis. Findings are categorized as probable UIP per consensus guidelines: Diagnosis of Idiopathic Pulmonary Fibrosis: An Official ATS/ERS/JRS/ALAT Clinical Practice Guideline. Am Rosezetta Schlatter Crit Care Med Vol 198, Iss 5, [phone], Aug 22 2017. 2. Air trapping is indicative of small airways disease. 3. 4.0 cm ascending aortic aneurysm, stable. Recommend annual imaging followup by CTA or MRA. This recommendation follows 2010 ACCF/AHA/AATS/ACR/ASA/SCA/SCAI/SIR/STS/SVM Guidelines for the Diagnosis  and Management of Patients with Thoracic Aortic Disease. Circulation. 2010; 121: V409-W119. Aortic aneurysm NOS (ICD10-I71.9). 4. Liver may be mildly steatotic. 5. Aortic atherosclerosis (ICD10-I70.0). Coronary artery calcification.   Electronically Signed  By: Leanna Battles M.D.  On: 01/11/2024 14:02   Treatments: surgery:   Operative Report    DATE OF PROCEDURE: 01/11/2024   PREOPERATIVE DIAGNOSIS:  Interstitial lung disease.   POSTOPERATIVE DIAGNOSIS:  Interstitial lung disease.   PROCEDURE:  Right video-assisted thoracoscopy, lung biopsy, intercostal nerve blocks levels 4 through 10.   SURGEON:  Salvatore Decent. Dorris Fetch, MD   ASSISTANT:  Aloha Gell, PA   Experienced assistance was necessary for this case due to surgical complexity.  Aloha Gell assisted with camera management, retraction of delicate tissues, suctioning, and wound closure.  PATHOLOGY: Pending  Discharge Exam: Blood pressure (!) 153/96, pulse 83, temperature 97.6 F (36.4 C), temperature source Oral, resp. rate 16, height 6\' 2"  (1.88 m), weight 110.9 kg, SpO2 92%.    General appearance: alert, cooperative, and no distress Heart: regular rate and rhythm Lungs: clear to auscultation bilaterally Abdomen: soft, non-tender; bowel sounds normal; no masses,  no organomegaly Extremities: extremities normal, atraumatic, no cyanosis or edema Wound: clean and dry  Discharge disposition: 01-Home or Self Care  Allergies as of 01/13/2024   No Known Allergies      Medication List     TAKE these medications    acetaminophen 500 MG tablet Commonly known as: TYLENOL Take 1-2 tablets (500-1,000 mg total) by mouth every 6 (six) hours as needed.   AeroChamber MV inhaler Use as instructed What changed:  how much to take how to take this when to take this   albuterol (2.5 MG/3ML) 0.083% nebulizer solution Commonly known as:  PROVENTIL Take 3 mLs (2.5 mg total) by nebulization every 2 (two) hours as  needed for wheezing or shortness of breath.   albuterol 108 (90 Base) MCG/ACT inhaler Commonly known as: VENTOLIN HFA Inhale 2 puffs into the lungs every 4 (four) hours as needed for wheezing or shortness of breath.   Arnuity Ellipta 100 MCG/ACT Aepb Generic drug: Fluticasone Furoate TAKE 1 PUFF BY MOUTH EVERY DAY What changed: See the new instructions.   aspirin 81 MG chewable tablet Chew 81 mg by mouth in the morning.   atorvastatin 20 MG tablet Commonly known as: LIPITOR Take 1 tablet (20 mg total) by mouth daily.   Breztri Aerosphere 160-9-4.8 MCG/ACT Aero Generic drug: Budeson-Glycopyrrol-Formoterol Inhale 2 puffs into the lungs in the morning and at bedtime.   gabapentin 300 MG capsule Commonly known as: Neurontin Take 1 capsule (300 mg total) by mouth at bedtime.   levocetirizine 5 MG tablet Commonly known as: XYZAL Take 1 tablet (5 mg total) by mouth every evening.   montelukast 10 MG tablet Commonly known as: Singulair Take 1 tablet (10 mg total) by mouth at bedtime.   mupirocin ointment 2 % Commonly known as: BACTROBAN Place 1 Application into the nose 2 (two) times daily. X 4 days   omeprazole 20 MG capsule Commonly known as: PRILOSEC Take 1 capsule (20 mg total) by mouth daily.   oxyCODONE 5 MG immediate release tablet Commonly known as: Oxy IR/ROXICODONE Take 1 tablet (5 mg total) by mouth every 4 (four) hours as needed for moderate pain (pain score 4-6) or severe pain (pain score 7-10).   triamcinolone cream 0.1 % Commonly known as: KENALOG APPLY TO AFFECTED AREA TWICE A DAY What changed: See the new instructions.               Durable Medical Equipment  (From admission, onward)           Start     Ordered   01/12/24 1521  For home use only DME oxygen  Once       Question Answer Comment  Length of Need 6 Months   Mode or (Route) Nasal cannula   Liters per Minute 2   Frequency Continuous (stationary and portable oxygen unit needed)    Oxygen delivery system Gas      01/12/24 1520            Follow-up Information     [facility] Follow up on 01/26/2024.   Why: To get chest xray at 2:00PM Contact information: [address] [place] [address]        [facility] Follow up on 01/26/2024.   Specialty: Cardiothoracic Surgery Why: Follow up appointment is at 3:00PM Contact information: [address] [place], [address] [place] [address] [phone]                Signed: Lowella Dandy, PA-C  01/13/2024, 7:25 AM

## 2024-01-12 NOTE — Discharge Instructions (Addendum)
Discharge Instructions:  1. You may shower, please wash incisions daily with soap and water and keep dry.  If you wish to cover wounds with dressing you may do so but please keep clean and change daily.  No tub baths or swimming until incisions have completely healed.  If your incisions become red or develop any drainage please call our office at [phone]  2. No Driving until cleared by Dr. Sunday Corn office and you are no longer using narcotic pain medications  3. Fever of 101.5 for at least 24 hours with no source, please contact our office at [phone]  4. Activity- up as tolerated, please walk at least 3 times per day.  Avoid strenuous activity, no lifting, pushing, or pulling with your arms over 8-10 lbs for a minimum of 6 weeks  5. If any questions or concerns arise, please do not hesitate to contact our office at [phone]  [facility] assistance programs. If you are behind on your bills and expenses, and need some help to make it through a short term hardship or financial emergency, there are several organizations and charities in the [place] area that may be able to help. They range from the [employer], [employer], Landscape architect of [employer] and the local community action agency, the Intel, [employer]. These groups may be able to provide you resources to help pay your utility bills, rent, and they even offer housing assistance.  Crisis assistance program Find help for paying your rent, electric bills, free food, and even funds to pay your mortgage. The [employer] (443) 720-8553) offers several services to local families, as funding allows. The Emergency Assistance Program (EAP), which they administer, provides household goods, free food, clothing, and financial aid to people in need in the [facility] area. The EAP program does have some qualification, and counselors will interview  clients for financial assistance by written referral only. Referrals need to be made by the Department of Social Services or by other EAP approved human services agencies or charities in the area.  Money for resources for emergency assistance are available for security deposits for rent, water, electric, and gas, past due rent, utility bills, past due mortgage payments, food, and clothing. The [employer] also operates a Programme researcher, broadcasting/film/video on the site. More [employer].  Open Door Ministries of [employer], which can be reached at 469-280-9193, offers emergency assistance programs for those in need of help, such as food, rent assistance, a soup kitchen, shelter, and clothing. They are based in [facility] but provide a number of services to those that qualify for assistance. Continue with Open Door Ministries programs.  [facility] Department of Social Services may be able to offer temporary financial assistance and cash grants for paying rent and utilities. Help may be provided for local county residents who may be experiencing personal crisis when other resources, including government programs, are not available. Call (201) 138-4751  [place]. [place] [employer], which is based in [place], provides financial assistance of up to $50.00 to help pay for rent, utilities, cooling bills, rent, and prescription medications. The program also provides secondhand furniture to those in need. 878-471-0066  [employer] is a Geneticist, molecular. The organization can offer emergency assistance for paying rent, electric bills, utilities, food, household products and furniture. They offer extensive emergency and transitional housing for families, children and single women, and  also run a Boy's and Dole Food. [address] [employer], [employer], and other aid offered too. [address], [place], [place] [address], (360)[phone]  Additional locations of the [employer] are in [place] and other nearby communities. When you have an emergency, need free food, money for basic needs, or just need assistance around Christmas, then the [employer] may have the resources you need. Or they can refer you to nearby agencies. Learn more.  [place] Low Income Risk manager - This is offered for [facility] families. The federal government created [employer] Program provides a one-time cash grant payment to help eligible low-income families pay their electric and heating bills. [address] [place], [place] [address], [phone]  Government and [employer] - The county administers several emergency and self-sufficiency programs. Residents of [place] can get help with energy bills and food, rent, and other expenses. In addition, work with a Sports coach who may be able to help you find a job or improve your employment skills. More [place] public assistance.  [place] Emergency Assistance - A program offers emergency utility and rent funds for greater [employer] area residents. The program can also provide counseling and referrals to charities and government programs. Also provides food and a free meal program that serves lunch Mondays - Saturdays and dinner seven days per week to individuals in the community. [address] [place], [place] [address], [phone]  [employer] - Offers affordable apartment and housing communities across [place] and [place]. The low income and seniors can access public housing, rental assistance to qualified applicants, and apply for the section 8 rent subsidy program. Other programs include Chiropractor and Engineer, maintenance. [address], [place], [place] [address], dial [phone].  Basic needs such as clothing - Low income families can  receive free items (school supplies, clothes, holiday assistance, etc.) from clothing closets while more moderate income [address] families can shop at [employer]. Locations across the area help the needy. Get information on [place] free clothing centers.  The [facility] provides transitional housing to veterans and the disabled. Clients will also access other services too, including life skills classes, case management, and assistance in finding permanent housing. [address] [place], [place] [address], call [phone]  [facility] [place] Transitional Housing in [place] is for people who were just evicted or that are formerly homeless. The non-profit will also help then gain self-sufficiency, find a home or apartment to live in, and also provides information on rent assistance when needed. Dial [phone]  [employer] Partnership to End Homelessness is available in [employer]. Families that were evicted or that are homeless can gain shelter, food, clothing, furniture, and also emergency financial assistance. Other services include financial skills and life skills coaching, job training, and case management. [address], [place], [place] [address]. Telephone ([phone].  The [employer]ne[employer], [employer]. runs the [employer]. This can help people save money on their heating and summer cooling bills, and is free to low income families. Free upgrades can be made to your home. Phone [phone]  Many of the non-profits and programs mentioned above are all inclusive, meaning they can meet many needs of the low income, such as energy bills, food, rent, and more. However there are several organizations that focus just on rent and housing. Read more on rent assistance in [place] region.  Legal assistance for evictions, foreclosure, and more If you need free legal advice on civili issues, such as foreclosures, evictions,  Electronics engineer, government programs, domestic issues and more, Armed forces operational officer Aid of [facility]) is a Associate Professor firm that provides free legal services and counsel to lower income people, seniors, disabled, and others. The goal is to ensure everyone has access to justice and fair representation.  Call them at (860) 418-8400, or click here to learn more about [place] free legal assistance programs.  [place] [employer] and funds for emergency expenses The [employer] Stores is another organization that can provide people with De[employer] & Company and funds to pay bills. Their assistance depends on funding, and the demand for help is always very high. They can provide cash to help pay rent, a missed mortgage payment, or gas, electric, and water bills. But the assistance doesn't stop there. They also have a food pantry on site, which can provide food once every three (3) months to people who need help. The [employer] can also offer a Engineering geologist once every three (3) months for a maximum three (3) times. After receiving this voucher over that period of time, applicants can receive this aid one every six (6) months after that. [phone].  Kohl's action agency The [employer], [employer]. offers job and Dispensing optician. Resources are focused on helping students obtain the skills and experiences that are necessary to compete in today's challenging and tight job market. The non-profit faith-based community action agency offers internship trainings as well as classroom instruction. Economically disadvantaged and challenged individuals and potential employers can use their services. Classes are tailored to meet the needs of people in the [facility] region. [place], [place] [address], [phone]    Foreclosure prevention services Housing Counseling and Education is also offered by [employer] of the [place]. The agency (phone number is below) is  a Engineer, structural providing foreclosure advice and counseling. They offer mortgage resolution counseling and also reverse mortgage counseling. Counselors can direct people to both Kimberly[employer]ark, as well as [employer]euser Company foreclosure assistance options.  Warehouse manager has locations in [place] and [employer]. They run debt and foreclosure prevention programs for local families. A sampling of the programs offered include both Budget and Housing Counseling. This includes money management, financial advice, budget review and development of a written action plan with a Pensions consultant to help solve specific individual financial problems. In addition, housing and mortgage counselors can also provide pre- and post-purchase homeownership counseling, default resolution counseling (to prevent foreclosure) and reverse mortgage counseling. A Debt Management Program allows people and families with a high level of credit card or medical debt to consolidate and repay consumer debt and loans to creditors and rebuild positive credit ratings and scores. ([phone]  Debt assistance programs Receive free counseling and debt help from [facility] of the [place]. The [facility] based agency can be reached at ([phone]. The counselors provide free help, and the services include budget counseling. This will help people manage their expenses and set goals. They also offer a Forensic scientist, which will help individuals consolidate their debts and become debt free. Most of the workshops and services are free.  Community clinics in [place] of the leading health and dental centers are listed below. They may be able to provide medication, physicals, dental care, and general family care  to residents of all incomes and backgrounds across the region. Some of the programs focus  on the low income and underinsured. However if these clinics can't meet your needs, find information and details on more clinics in [facility].  Some of the options include [employer] of [employer]. This center provides free or low cost health care to low-income adults 18 - 64, who have no health insurance. Among other services offered include a pharmacy and eye clinic. Phone [phone]  [facility] [place], which is located in [place], is a community clinic that provides primary medical and health care to uninsured and underinsured adults and families, as well as the low income, in the greater [place] area on a sliding-fee scale. Call [phone]  [place] Adult Dental Program - They run a dental assistance program that is organized by [facility] to provide dental services and aid to [employer]. Services offered by the dental clinic are limited to extractions, pain management, and minor restorative care. [phone]  [place] [facility] Health has locations in [facility] and [employer]. The community clinics provide complete pediatric care including primary health, mental health, social work, neurology, cardiology, asthma. Dial [phone].  In addition to those [address] and [employer], find other free community clinics in [place] and across the county.  Food pantry and assistance Some of the local food pantries and distribution centers to call for free food and groceries include The Hive of [place] (phone [phone]), The [facility] (phone [phone]) and also [employer]. Dial [phone].  Several other food banks in the region provide clothing, free food and meals, access to soup kitchens and other help. Find the addresses and phone numbers of more food pantries in [place]. http://www.needhelppayingbills.com/html/[place]_assistance_pro.html  Low Income Public Housing  [facility] Housing Authority [employer] Supported Living Apts [phone] [facility] [phone]  [facility] [address], [place] [phone]  [facility] [address] [place] [phone]  [facility] [address] [place]. [place] [phone]  [place] [address] [place] [phone]  [place] [address]  [address], [place] [phone]  [facility] Homes [address] [place] [phone]  [facility] [address] [place] [phone]  [facility] [address], [place] [phone]  [facility] A[address]  [facility] [phone]  [facility]  [address] [place] [phone]  [place] [address]  [address] [place] [phone]   [place] [address] [place] [phone]  [facility] [facility] [address] [place] [phone] [address], [place] [phone]  [facility] Apts. [address], [place] [phone]  [place] [address] [place] [phone]  [facility] Apts. [address] [place] [phone]  [employer] Apts.  [address] [place] [phone]  [facility] Apts. [address] [place] [phone] Apts.  [address] [phone]  [facility] Apts. [address]  [phone]  [place] Apts[address]  [address] [facility] [phone]

## 2024-01-12 NOTE — Progress Notes (Signed)
Mobility Specialist Progress Note;   01/12/24 1145  Mobility  Activity Ambulated with assistance in hallway  Level of Assistance Minimal assist, patient does 75% or more  Assistive Device None  Distance Ambulated (ft) 150 ft  Activity Response Tolerated well  Mobility Referral Yes  Mobility visit 1 Mobility  Mobility Specialist Start Time (ACUTE ONLY) 1145  Mobility Specialist Stop Time (ACUTE ONLY) 1200  Mobility Specialist Time Calculation (min) (ACUTE ONLY) 15 min   RN requesting MS to walk w/ pt. On RA upon arrival. Required MinA during ambulation w/ 1 LOB noted upon standing. Ambulated on RA, pt displayed dyspnea eventually increased pt to 1LO2 to stay [facility]. No c/o throughout session. Pt returned back to bed with all needs met, on 1LO2. RN notified.  Caesar Bookman Mobility Specialist Please contact via SecureChat or [employer] [phone]

## 2024-01-12 NOTE — Progress Notes (Signed)
Nurse requested Mobility Specialist to perform oxygen saturation test with pt which includes removing pt from oxygen both at rest and while ambulating.  Below are the results from that testing.     Patient Saturations on Room Air at Rest = spO2 92%  Patient Saturations on Room Air while Ambulating = sp02 86% .  Patient Saturations on 1 Liters of oxygen while Ambulating = sp02 92%  At end of testing pt left in room on 1  Liters of oxygen.  Reported results to nurse.    Caesar Bookman Mobility Specialist Please contact via SecureChat or [employer] [phone]

## 2024-01-12 NOTE — TOC Initial Note (Signed)
Transition of Care [facility]) - Initial/Assessment Note    Patient Details  Name: Mark Doyle MRN: [id number] Date of Birth: August 14, 1956  [facility]) CM/SW Contact:    Kingsley Plan, RN Phone Number: 01/12/2024, 3:32 PM  Clinical Narrative:                  Anticipated discharge date 01/13/24 . Order for home oxygen. Same ordered through [facility] with [employer]. Explained to patient portable oxygen DME will be delivered to room prior to discharge  Expected Discharge Plan: Home/Self Care Barriers to Discharge: Continued Medical Work up   Patient Goals and CMS Choice Patient states their goals for this hospitalization and ongoing recovery are:: to return to home CMS Medicare.gov Compare Post Acute Care list provided to:: Patient Choice offered to / list presented to : Patient      Expected Discharge Plan and Services     Post Acute Care Choice: Durable Medical Equipment Living arrangements for the past 2 months: Single Family Home                 DME Arranged: Oxygen DME Agency: [employer] Date DME Agency Contacted: 01/12/24 Time DME Agency Contacted: 1531 Representative spoke with at DME Agency: Vaughan Basta HH Arranged: NA HH Agency: NA        Prior Living Arrangements/Services Living arrangements for the past 2 months: Single Family Home Lives with:: Spouse Patient language and need for interpreter reviewed:: Yes Do you feel safe going back to the place where you live?: Yes      Need for Family Participation in Patient Care: Yes (Comment) Care giver support system in place?: Yes (comment)   Criminal Activity/Legal Involvement Pertinent to Current Situation/Hospitalization: No - Comment as needed  Activities of Daily Living   ADL Screening (condition at time of admission) Independently performs ADLs?: Yes (appropriate for developmental age) Is the patient deaf or have difficulty hearing?: Yes Does the patient have difficulty seeing, even  when wearing glasses/contacts?: No Does the patient have difficulty concentrating, remembering, or making decisions?: No  Permission Sought/Granted   Permission granted to share information with : Yes, Verbal Permission Granted     Permission granted to share info w AGENCY: [employer]        Emotional Assessment Appearance:: Appears stated age   Affect (typically observed): Adaptable Orientation: : Oriented to Self, Oriented to Place, Oriented to  Time, Oriented to Situation Alcohol / Substance Use: Not Applicable Psych Involvement: No (comment)  Admission diagnosis:  ILD (interstitial lung disease) (HCC) [J84.9] Interstitial lung disease (HCC) [J84.9] Patient Active Problem List   Diagnosis Date Noted   s/p Right Video Assisted Thoracoscopy with Lung Biopsy 01/12/2024   Interstitial lung disease (HCC) 01/11/2024   Preop cardiovascular exam 01/01/2024   Seasonal and perennial allergic rhinitis 12/25/2023   Cortical age-related cataract of both eyes 03/09/2023   Early dry stage nonexudative age-related macular degeneration of both eyes 03/09/2023   Myopia of both eyes with astigmatism and presbyopia 03/09/2023   Nuclear sclerotic cataract of both eyes 03/09/2023   ILD (interstitial lung disease) (HCC) 02/23/2023   Obstructive lung disease (HCC) 11/25/2022   Coronary artery calcification 11/25/2022   Dyspnea on exertion 08/02/2021   Environmental allergies 08/01/2021   Asthma 08/01/2021   Elevated LFTs 11/08/2019   Mild intermittent asthma without complication 11/08/2019   Pure hypercholesterolemia 11/08/2019   PCP:  Olive Bass, FNP Pharmacy:   [id number] [place], [place] - [address] AT [facility] [address] [place] [address] Phone: [phone] Fax: [phone]  [id number] - [place], [place] - [address] [place] [address] Phone: [phone] Fax:  [phone]  [facility] [address] [place] [address] Phone: [phone] Fax: [phone]     Social Drivers of Health (SDOH) Social History: SDOH Screenings   Food Insecurity: No Food Insecurity (01/11/2024)  Housing: High Risk (01/11/2024)  Transportation Needs: No Transportation Needs (01/11/2024)  Utilities: At Risk (01/11/2024)  Alcohol Screen: Low Risk  (10/20/2023)  Depression (PHQ2-9): Low Risk  (10/20/2023)  Financial Resource Strain: Low Risk  (10/20/2023)  Physical Activity: Inactive (10/20/2023)  Social Connections: Moderately Isolated (01/11/2024)  Stress: No Stress Concern Present (10/20/2023)  Tobacco Use: High Risk (01/11/2024)  Health Literacy: Adequate Health Literacy (10/20/2023)   SDOH Interventions:     Readmission Risk Interventions     No data to display

## 2024-01-13 ENCOUNTER — Inpatient Hospital Stay (HOSPITAL_COMMUNITY): Payer: 59

## 2024-01-13 ENCOUNTER — Other Ambulatory Visit (HOSPITAL_COMMUNITY): Payer: Self-pay

## 2024-01-13 LAB — COMPREHENSIVE METABOLIC PANEL
ALT: 22 U/L (ref 0–44)
AST: 29 U/L (ref 15–41)
Albumin: 3.1 g/dL — ABNORMAL LOW (ref 3.5–5.0)
Alkaline Phosphatase: 67 U/L (ref 38–126)
Anion gap: 7 (ref 5–15)
BUN: 17 mg/dL (ref 8–23)
CO2: 29 mmol/L (ref 22–32)
Calcium: 8.6 mg/dL — ABNORMAL LOW (ref 8.9–10.3)
Chloride: 93 mmol/L — ABNORMAL LOW (ref 98–111)
Creatinine, Ser: 1.08 mg/dL (ref 0.61–1.24)
GFR, Estimated: >60 mL/min (ref >60)
Glucose, Bld: 126 mg/dL — ABNORMAL HIGH (ref 70–99)
Potassium: 4.1 mmol/L (ref 3.5–5.1)
Sodium: 129 mmol/L — ABNORMAL LOW (ref 135–145)
Total Bilirubin: 1.2 mg/dL (ref 0.0–1.2)
Total Protein: 6.4 g/dL — ABNORMAL LOW (ref 6.5–8.1)

## 2024-01-13 LAB — CBC
HCT: 39.2 % (ref 39.0–52.0)
Hemoglobin: 13.2 g/dL (ref 13.0–17.0)
MCH: 33.9 pg (ref 26.0–34.0)
MCHC: 33.7 g/dL (ref 30.0–36.0)
MCV: 100.8 fL — ABNORMAL HIGH (ref 80.0–100.0)
Platelets: 213 10*3/uL (ref 150–400)
RBC: 3.89 MIL/uL — ABNORMAL LOW (ref 4.22–5.81)
RDW: 13.2 % (ref 11.5–15.5)
WBC: 13.5 10*3/uL — ABNORMAL HIGH (ref 4.0–10.5)
nRBC: 0 % (ref 0.0–0.2)

## 2024-01-13 LAB — ACID FAST SMEAR (AFB, MYCOBACTERIA): Acid Fast Smear: NEGATIVE

## 2024-01-13 MED ORDER — OXYCODONE HCL 5 MG PO TABS
5.0000 mg | ORAL_TABLET | ORAL | 0 refills | Status: DC | PRN
  Filled 2024-01-13: qty 30, 5d supply, fill #0

## 2024-01-13 MED ORDER — ACETAMINOPHEN 500 MG PO TABS
500.0000 mg | ORAL_TABLET | Freq: Four times a day (QID) | ORAL | 0 refills | Status: AC | PRN
  Filled 2024-01-13: qty 30, 4d supply, fill #0

## 2024-01-13 MED ORDER — GABAPENTIN 300 MG PO CAPS
300.0000 mg | ORAL_CAPSULE | Freq: Every day | ORAL | 3 refills | Status: DC
  Filled 2024-01-13: qty 30, 30d supply, fill #0

## 2024-01-13 MED ORDER — MUPIROCIN 2 % EX OINT
1.0000 | TOPICAL_OINTMENT | Freq: Two times a day (BID) | CUTANEOUS | 0 refills | Status: DC
  Filled 2024-01-13: qty 22, 11d supply, fill #0

## 2024-01-13 NOTE — Progress Notes (Addendum)
      [address]       [place] [address]             [phone]      2 Days Post-Op Procedure(s) (LRB): VIDEO ASSISTED THORACOSCOPY (Right) LUNG BIOPSY (Right)  Subjective:  Patient is eating breakfast this morning.  He is ready to go home.  Objective: Vital signs in last 24 hours: Temp:  [97.6 F (36.4 C)-97.8 F (36.6 C)] 97.6 F (36.4 C) (01/22 0601) Pulse Rate:  [81-94] 83 (01/22 0601) Cardiac Rhythm: Normal sinus rhythm;Heart block (01/21 1900) Resp:  [15-23] 16 (01/22 0601) BP: (127-157)/(85-98) 153/96 (01/22 0601) SpO2:  [91 %-93 %] 92 % (01/22 0601) Weight:  [110.9 kg] 110.9 kg (01/22 0557)  Intake/Output from previous day: 01/21 0701 - 01/22 0700 In: 360 [P.O.:360] Out: 710 [Urine:700; Chest Tube:10]  General appearance: alert, cooperative, and no distress Heart: regular rate and rhythm Lungs: clear to auscultation bilaterally Abdomen: soft, non-tender; bowel sounds normal; no masses,  no organomegaly Extremities: extremities normal, atraumatic, no cyanosis or edema Wound: clean and dry  Lab Results: Recent Labs    01/12/24 0210 01/13/24 0212  WBC 12.1* 13.5*  HGB 13.8 13.2  HCT 40.5 39.2  PLT 223 213   BMET:  Recent Labs    01/12/24 0210 01/13/24 0212  NA 131* 129*  K 4.4 4.1  CL 99 93*  CO2 23 29  GLUCOSE 146* 126*  BUN 12 17  CREATININE 1.01 1.08  CALCIUM 8.2* 8.6*    PT/INR: No results for input(s): "LABPROT", "INR" in the last 72 hours. ABG No results found for: "PHART", "HCO3", "TCO2", "ACIDBASEDEF", "O2SAT" CBG (last 3)  No results for input(s): "GLUCAP" in the last 72 hours.  Assessment/Plan: S/P Procedure(s) (LRB): VIDEO ASSISTED THORACOSCOPY (Right) LUNG BIOPSY (Right)  CV- NSR, + HTN- patient states he had previous history, he states his blood pressure has been normal until now.. he will follow up with primary care physician and he is monitoring at home Pulm- will require oxygen at discharge.. CTs  have been removed, CXR without pneumothorax Renal-creatinine remains stable Lovenox for DVT prophylaxis  Dispo- patient stable, will d/c home today   LOS: 2 days    Lowella Dandy, PA-C 01/13/2024 Patient seen and examined, agree with assessment and plan outlined above Dc home  Octavia Mottola C. Dorris Fetch, MD [facility] [phone]

## 2024-01-13 NOTE — Anesthesia Postprocedure Evaluation (Signed)
Anesthesia Post Note  Patient: Mark Doyle  Procedure(s) Performed: VIDEO ASSISTED THORACOSCOPY (Right: Chest) LUNG BIOPSY (Right)     Patient location during evaluation: PACU Anesthesia Type: General Level of consciousness: awake and alert Pain management: pain level controlled Vital Signs Assessment: post-procedure vital signs reviewed and stable Respiratory status: spontaneous breathing, nonlabored ventilation, respiratory function stable and patient connected to nasal cannula oxygen Cardiovascular status: blood pressure returned to baseline and stable Postop Assessment: no apparent nausea or vomiting Anesthetic complications: no   No notable events documented.  Last Vitals:  Vitals:   01/13/24 0601 01/13/24 0737  BP: (!) 153/96   Pulse: 83   Resp: 16   Temp: 36.4 C 37.1 C  SpO2: 92%     Last Pain:  Vitals:   01/13/24 0853  TempSrc:   PainSc: 5                  Kennieth Rad

## 2024-01-13 NOTE — Plan of Care (Signed)

## 2024-01-14 ENCOUNTER — Telehealth: Payer: Self-pay

## 2024-01-14 NOTE — Progress Notes (Signed)
 [address]       [place] Doyle [address]             [phone]    HPI: Patient returns for routine postoperative follow-up having undergone Right VATS with lung biopsy on 01/11/2024 by Dr. Kerrin.  The patient's early postoperative recovery while in the hospital was notable for decreased oxygen  saturations with ambulation.  Home oxygen  was arranged. Since hospital discharge the patient reports he continues to have some pain with coughing and activity.  He states he is no longer using oxygen .  He has not yet followed up with Pulmonary as his appointment was when he was in the hospital.  They state no one has called them about pathology.  I said I can provide this today, however Pulmonology will determine treatment plan based on results.  He is requesting pain medication refill.  Current Outpatient Medications  Medication Sig Dispense Refill   acetaminophen  (TYLENOL ) 500 MG tablet Take 1-2 tablets (500-1,000 mg total) by mouth every 6 (six) hours as needed. 30 tablet 0   albuterol  (PROVENTIL ) (2.5 MG/3ML) 0.083% nebulizer solution Take 3 mLs (2.5 mg total) by nebulization every 2 (two) hours as needed for wheezing or shortness of breath. 150 mL 5   albuterol  (VENTOLIN  HFA) 108 (90 Base) MCG/ACT inhaler Inhale 2 puffs into the lungs every 4 (four) hours as needed for wheezing or shortness of breath. 8.5 each 2   ARNUITY ELLIPTA  100 MCG/ACT AEPB TAKE 1 PUFF BY MOUTH EVERY DAY (Patient taking differently: Inhale 1 puff into the lungs daily.) 30 each 5   aspirin 81 MG chewable tablet Chew 81 mg by mouth in the morning.     atorvastatin  (LIPITOR) 20 MG tablet Take 1 tablet (20 mg total) by mouth daily. 30 tablet 0   Budeson-Glycopyrrol-Formoterol  (BREZTRI  AEROSPHERE) 160-9-4.8 MCG/ACT AERO Inhale 2 puffs into the lungs in the morning and at bedtime.     gabapentin  (NEURONTIN ) 300 MG capsule Take 1 capsule (300 mg total) by mouth at bedtime. 30 capsule 3   levocetirizine  (XYZAL ) 5 MG tablet Take 1 tablet (5 mg total) by mouth every evening. 30 tablet 5   montelukast  (SINGULAIR ) 10 MG tablet Take 1 tablet (10 mg total) by mouth at bedtime. 32 tablet 5   mupirocin  ointment (BACTROBAN ) 2 % Place 1 Application into the nose 2 (two) times daily for 4 days 22 g 0   omeprazole  (PRILOSEC) 20 MG capsule Take 1 capsule (20 mg total) by mouth daily. 90 capsule 3   oxyCODONE  (OXY IR/ROXICODONE ) 5 MG immediate release tablet Take 1 tablet (5 mg total) by mouth every 4 (four) hours as needed for moderate pain (pain score 4-6) or severe pain (pain score 7-10). 30 tablet 0   Spacer/Aero-Holding Chambers (AEROCHAMBER MV) inhaler Use as instructed (Patient taking differently: 1 each by Other route as directed. Use as instructed) 1 each 0   triamcinolone  cream (KENALOG ) 0.1 % APPLY TO AFFECTED AREA TWICE A DAY (Patient taking differently: Apply 1 Application topically 2 (two) times daily.) 30 g 0   No current facility-administered medications for this visit.    Physical Exam:  BP 131/82 (BP Location: Right Arm, Patient Position: Sitting, Cuff Size: Normal)   Pulse (!) 107   Resp 20   Wt 245 lb 9.6 oz (111.4 kg)   SpO2 93% Comment: RA  BMI 31.53 kg/m   Gen: NAD Heart RRR Lungs: CTA bilaterally Incisions: well healed  Diagnostic Tests:  CXR: no pneumothorax post biopsy  Impression:  S/P VATS with lung biopsy x 3.  CXR is free from pneumothorax.  Surgical incisions are well healed.  In regards to pain he was encouraged to use Ibuprofen prn.  He was provided a final Tramadol  script for 50 mg every 8 hours as needed for pain.  His Pathology was provided... results show UIP.... they were instructed to contact Pulmonary office for follow up appointment and treatment plan  Hypoxia- resolved, saturations in our office were >92% at rest and with ambulation.  [facility] will be notified to pick up home oxygen    RTC prn   Rocky Shad, PA-C [facility] [phone]

## 2024-01-14 NOTE — Patient Instructions (Signed)
You may return to driving an automobile as long as you are no longer requiring oral narcotic pain relievers during the daytime.  It would be wise to start driving only short distances during the daylight and gradually increase from there as you feel comfortable. 

## 2024-01-14 NOTE — Transitions of Care (Post Inpatient/ED Visit) (Signed)
   01/14/2024  Name: King Parrado MRN: [id number] DOB: [birth year]  Today's TOC FU Call Status: Today's TOC FU Call Status:: Unsuccessful Call (1st Attempt) Unsuccessful Call (1st Attempt) Date: 01/14/24  Attempted to reach the patient regarding the most recent Inpatient/ED visit. Left HIPAA compliant confidential Voice Mail per DPR in EPIC.   Follow Up Plan: Additional outreach attempts will be made to reach the patient to complete the Transitions of Care (Post Inpatient/ED visit) call.    Gabriel Cirri MSN, RN RN Case Sales executive Health  [facility]Population Health Office Hours Wed/Thur  8:00 am-6:00 pm Direct Dial: [phone] Main Phone [phone]  Fax: [phone] [email or web address]

## 2024-01-15 ENCOUNTER — Telehealth: Payer: Self-pay | Admitting: *Deleted

## 2024-01-15 LAB — SURGICAL PATHOLOGY

## 2024-01-15 NOTE — Transitions of Care (Post Inpatient/ED Visit) (Signed)
01/15/2024  Name: Mark Doyle MRN: [id number] DOB: 27-May-1956  Today's TOC FU Call Status: Today's TOC FU Call Status:: Successful TOC FU Call Completed TOC FU Call Complete Date: 01/15/24 Patient's Name and Date of Birth confirmed.  Transition Care Management Follow-up Telephone Call Date of Discharge: 01/13/24 Discharge Facility: [facility]) Type of Discharge: Inpatient Admission Primary Inpatient Discharge Diagnosis:: S/P (R) VATS secondary to VATS How have you been since you were released from the hospital?: Better ("I am doing just fine, not having any problems.  Using the home oxygen like they told me to; only having pain when I cough.  My wife is helping me if I need anything, but really I am back to my usual self") Any questions or concerns?: No  Items Reviewed: Did you receive and understand the discharge instructions provided?: Yes (thoroughly reviewed with patient who verbalizes good understanding of same) Medications obtained,verified, and reconciled?: Yes (Medications Reviewed) (Full medication reconciliation/ review completed; no concerns or discrepancies identified; confirmed patient obtained/ is taking all newly Rx'd medications as instructed; self-manages medications and denies questions/ concerns around medications today) Any new allergies since your discharge?: No Dietary orders reviewed?: Yes Type of Diet Ordered:: "Regular" Do you have support at home?: Yes People in Home: spouse Name of Support/Comfort Primary Source: Reports independent in self-care activities; spouse assists as/ if needed/ indicated  Medications Reviewed Today: Medications Reviewed Today     Reviewed by Michaela Corner, RN (Registered Nurse) on 01/15/24 at 1257  Med List Status: <None>   Medication Order Taking? Sig Documenting Provider Last Dose Status Informant  acetaminophen (TYLENOL) 500 MG tablet [id number] Yes Take 1-2 tablets (500-1,000 mg total) by mouth every 6 (six) hours as  needed. Barrett, Rae Roam, PA-C Taking Active   albuterol (PROVENTIL) (2.5 MG/3ML) 0.083% nebulizer solution [id number] Yes Take 3 mLs (2.5 mg total) by nebulization every 2 (two) hours as needed for wheezing or shortness of breath. Kalman Shan, MD Taking Active Self  albuterol (VENTOLIN HFA) 108 (90 Base) MCG/ACT inhaler [id number] Yes Inhale 2 puffs into the lungs every 4 (four) hours as needed for wheezing or shortness of breath. Kalman Shan, MD Taking Active Self  ARNUITY ELLIPTA 100 MCG/ACT AEPB [id number] Yes TAKE 1 PUFF BY MOUTH EVERY DAY  Patient taking differently: Inhale 1 puff into the lungs daily.   [name], DO Taking Active Self  aspirin 81 MG chewable tablet [id number] Yes Chew 81 mg by mouth in the morning. [provider] Taking Active Self           Med Note Michaela Corner   Fri Jan 15, 2024 12:34 PM) 01/15/24: Reports during TOC call, he has been "holding for a few days" post-recent surgery; reports to resume taking "later on today"  atorvastatin (LIPITOR) 20 MG tablet [id number] Yes Take 1 tablet (20 mg total) by mouth daily. Olive Bass, FNP Taking Active Self  Budeson-Glycopyrrol-Formoterol (BREZTRI AEROSPHERE) 160-9-4.8 MCG/ACT Sandrea Matte [id number] Yes Inhale 2 puffs into the lungs in the morning and at bedtime. [provider] Taking Active Self  gabapentin (NEURONTIN) 300 MG capsule [id number] Yes Take 1 capsule (300 mg total) by mouth at bedtime. Barrett, Rae Roam, PA-C Taking Active            Med Note Michaela Corner   Fri Jan 15, 2024 12:51 PM) 01/15/24: Reports during TOC call, his bottle from outpatient pharmacy instructs to take TID- so "that is what he is doing;" however, when he  looked at the bottle during phone call- he corrects himself- confirms it says to take at bedtime: reports he will start taking at bedtime as instructed   levocetirizine (XYZAL) 5 MG tablet [id number] Yes Take 1 tablet (5 mg total) by mouth every evening.  Verlee Monte, MD Taking Active Self  montelukast (SINGULAIR) 10 MG tablet [id number] Yes Take 1 tablet (10 mg total) by mouth at bedtime. Verlee Monte, MD Taking Active Self  mupirocin ointment (BACTROBAN) 2 % [id number] Yes Place 1 Application into the nose 2 (two) times daily for 4 days Barrett, Rae Roam, PA-C Taking Active   omeprazole (PRILOSEC) 20 MG capsule [id number] Yes Take 1 capsule (20 mg total) by mouth daily. Olive Bass, FNP Taking Active Self  oxyCODONE (OXY IR/ROXICODONE) 5 MG immediate release tablet [id number] Yes Take 1 tablet (5 mg total) by mouth every 4 (four) hours as needed for moderate pain (pain score 4-6) or severe pain (pain score 7-10). Barrett, Rae Roam, PA-C Taking Active   Spacer/Aero-Holding Chambers (AEROCHAMBER MV) inhaler [id number] Yes Use as instructed  Patient taking differently: 1 each by Other route as directed. Use as instructed   Icard, Rachel Bo, DO Taking Active Self  triamcinolone cream (KENALOG) 0.1 % [id number] Yes APPLY TO AFFECTED AREA TWICE A DAY  Patient taking differently: Apply 1 Application topically 2 (two) times daily.   Olive Bass, FNP Taking Active Self           Home Care and Equipment/Supplies: Were Home Health Services Ordered?: No Any new equipment or medical supplies ordered?: Yes (home O2) Name of Medical supply agency?: [employer] Were you able to get the equipment/medical supplies?: Yes Do you have any questions related to the use of the equipment/supplies?: No  Functional Questionnaire: Do you need assistance with bathing/showering or dressing?: No Do you need assistance with meal preparation?: No (spouse assisting as indicated) Do you need assistance with eating?: No Do you have difficulty maintaining continence: No Do you need assistance with getting out of bed/getting out of a chair/moving?: No Do you have difficulty managing or taking your medications?: No  Follow up appointments reviewed: PCP  Follow-up appointment confirmed?: NA (verified not indicated per hospital discharging provider discharge notes) Specialist Hospital Follow-up appointment confirmed?: Yes Date of Specialist follow-up appointment?: 01/26/24 (verified this is recommended time frame for follow up per hospital discharging provider notes) Follow-Up Specialty Provider:: [facility] surgical provider Do you need transportation to your follow-up appointment?: No Do you understand care options if your condition(s) worsen?: Yes-patient verbalized understanding  SDOH Interventions Today    Flowsheet Row Most Recent Value  SDOH Interventions   Food Insecurity Interventions Intervention Not Indicated  Housing Interventions Intervention Not Indicated, Other (Comment)  [confirms he obtained resources for housing costs at time of hospital discharge: reviewed with patient: he denies additional needs during [facility] call today,  states he will call resources provided "soon"]  Transportation Interventions Intervention Not Indicated  [Reports drives self]  Utilities Interventions Other (Comment)  [confirms he obtained resources for housing costs at time of hospital discharge: reviewed with patient: he denies additional needs during [facility] call today,  states he will call resources provided "soon"]      Interventions Today    Flowsheet Row Most Recent Value  Chronic Disease   Chronic disease during today's visit Other  [ILD- (R) VATS- biopsy]  General Interventions   General Interventions Discussed/Reviewed General Interventions Discussed, Durable Medical Equipment (DME), Doctor Visits  Doctor Visits Discussed/Reviewed PCP,  Specialist, Doctor Visits Discussed  Durable Medical Equipment (DME) Oxygen  PCP/Specialist Visits Compliance with follow-up visit  Exercise Interventions   Exercise Discussed/Reviewed Exercise Discussed  [benefit of conservative post-op activity,  need to pace activity without over-doing- confirmed patient is ambulating  frequently around home as instructed at time of hospital discharge]  Education Interventions   Education Provided Provided Education  Provided Verbal Education On Other, Medication  [Scheduling of gabapentin clarified/ reviewed,  safe use of home O2]  Nutrition Interventions   Nutrition Discussed/Reviewed Nutrition Discussed  Pharmacy Interventions   Pharmacy Dicussed/Reviewed Pharmacy Topics Discussed  [Full medication review with updating medication list in EHR per patient report]  Safety Interventions   Safety Discussed/Reviewed Safety Discussed, Fall Risk       TOC Interventions Today    Flowsheet Row Most Recent Value  TOC Interventions   TOC Interventions Discussed/Reviewed TOC Interventions Discussed, Post op wound/incision care, S/S of infection  [Patient declines need for ongoing/ further care management outreach,  declines enrollment in 30-day TOC program,  provided my direct contact information should questions/ concerns/ needs arise post-TOC call]      Caryl Pina, RN, BSN, CCRN Alumnus RN Care Manager  Transitions of Care  [facility] - Population Health  [place] [phone]: direct office

## 2024-01-15 NOTE — Transitions of Care (Post Inpatient/ED Visit) (Signed)
   01/15/2024  Name: Mark Doyle MRN: [id number] DOB: 1956-10-22  Today's TOC FU Call Status: Today's TOC FU Call Status:: Unsuccessful Call (2nd Attempt) Unsuccessful Call (2nd Attempt) Date: 01/15/24  Attempted to reach the patient regarding the most recent Inpatient visit; ; left HIPAA compliant voice message requesting call back  Follow Up Plan: Additional outreach attempts will be made to reach the patient to complete the Transitions of Care (Post Inpatient visit) call.   Caryl Pina, RN, BSN, Media planner  Transitions of Care  [facility] - [facility] [phone]: direct office

## 2024-01-16 LAB — AEROBIC/ANAEROBIC CULTURE W GRAM STAIN (SURGICAL/DEEP WOUND): Culture: NO GROWTH

## 2024-01-20 ENCOUNTER — Other Ambulatory Visit: Payer: Self-pay | Admitting: *Deleted

## 2024-01-20 MED ORDER — TRAMADOL HCL 50 MG PO TABS: 50.0000 mg | ORAL_TABLET | Freq: Four times a day (QID) | ORAL | 0 refills | Status: DC | PRN

## 2024-01-20 NOTE — Progress Notes (Signed)
Patient contacted the office requesting a refill of pain medication. States he has been coughing up phlegm which causes pain at his surgical incisions. Per Jacques Earthly, PA, Oxycodone discontinued and Tramadol sent to patient's preferred pharmacy. Patient aware.

## 2024-01-25 ENCOUNTER — Other Ambulatory Visit: Payer: Self-pay | Admitting: Thoracic Surgery (Cardiothoracic Vascular Surgery)

## 2024-01-25 DIAGNOSIS — J849 Interstitial pulmonary disease, unspecified: Secondary | ICD-10-CM

## 2024-01-26 ENCOUNTER — Ambulatory Visit
Admission: RE | Admit: 2024-01-26 | Discharge: 2024-01-26 | Disposition: A | Discharge status: Home / Self Care | Payer: 59 | Source: Ambulatory Visit | Attending: Thoracic Surgery (Cardiothoracic Vascular Surgery) | Admitting: Thoracic Surgery (Cardiothoracic Vascular Surgery)

## 2024-01-26 ENCOUNTER — Ambulatory Visit (INDEPENDENT_AMBULATORY_CARE_PROVIDER_SITE_OTHER): Payer: Self-pay | Admitting: Physician Assistant

## 2024-01-26 VITALS — BP 131/82 | HR 107 | Resp 20 | Wt 245.6 lb

## 2024-01-26 DIAGNOSIS — J90 Pleural effusion, not elsewhere classified: Secondary | ICD-10-CM | POA: Diagnosis not present

## 2024-01-26 DIAGNOSIS — J9811 Atelectasis: Secondary | ICD-10-CM | POA: Diagnosis not present

## 2024-01-26 DIAGNOSIS — Z09 Encounter for follow-up examination after completed treatment for conditions other than malignant neoplasm: Secondary | ICD-10-CM

## 2024-01-26 DIAGNOSIS — J849 Interstitial pulmonary disease, unspecified: Secondary | ICD-10-CM

## 2024-01-26 MED ORDER — TRAMADOL HCL 50 MG PO TABS: 50.0000 mg | ORAL_TABLET | Freq: Three times a day (TID) | ORAL | 0 refills | Status: DC | PRN

## 2024-02-21 ENCOUNTER — Other Ambulatory Visit: Payer: Self-pay | Admitting: Internal Medicine

## 2024-02-23 ENCOUNTER — Ambulatory Visit: Payer: 59 | Admitting: Internal Medicine

## 2024-02-25 ENCOUNTER — Encounter: Payer: Self-pay | Admitting: Nurse Practitioner

## 2024-02-25 ENCOUNTER — Ambulatory Visit: Payer: 59 | Admitting: Nurse Practitioner

## 2024-02-25 ENCOUNTER — Other Ambulatory Visit (INDEPENDENT_AMBULATORY_CARE_PROVIDER_SITE_OTHER)

## 2024-02-25 VITALS — BP 142/91 | HR 93 | Ht 74.0 in | Wt 243.2 lb

## 2024-02-25 DIAGNOSIS — J849 Interstitial pulmonary disease, unspecified: Secondary | ICD-10-CM

## 2024-02-25 DIAGNOSIS — J84112 Idiopathic pulmonary fibrosis: Secondary | ICD-10-CM

## 2024-02-25 DIAGNOSIS — J3089 Other allergic rhinitis: Secondary | ICD-10-CM | POA: Diagnosis not present

## 2024-02-25 DIAGNOSIS — J302 Other seasonal allergic rhinitis: Secondary | ICD-10-CM

## 2024-02-25 DIAGNOSIS — J4550 Severe persistent asthma, uncomplicated: Secondary | ICD-10-CM

## 2024-02-25 DIAGNOSIS — F17220 Nicotine dependence, chewing tobacco, uncomplicated: Secondary | ICD-10-CM

## 2024-02-25 LAB — COMPREHENSIVE METABOLIC PANEL
ALT: 21 U/L (ref 0–53)
AST: 19 U/L (ref 0–37)
Albumin: 4 g/dL (ref 3.5–5.2)
Alkaline Phosphatase: 102 U/L (ref 39–117)
BUN: 5 mg/dL — ABNORMAL LOW (ref 6–23)
CO2: 21 meq/L (ref 19–32)
Calcium: 9 mg/dL (ref 8.4–10.5)
Chloride: 98 meq/L (ref 96–112)
Creatinine, Ser: 0.62 mg/dL (ref 0.40–1.50)
GFR: 98.87 mL/min (ref >60.00)
Glucose, Bld: 95 mg/dL (ref 70–99)
Potassium: 3.9 meq/L (ref 3.5–5.1)
Sodium: 130 meq/L — ABNORMAL LOW (ref 135–145)
Total Bilirubin: 0.7 mg/dL (ref 0.2–1.2)
Total Protein: 7.5 g/dL (ref 6.0–8.3)

## 2024-02-25 MED ORDER — OFEV 150 MG PO CAPS: 150.0000 mg | ORAL_CAPSULE | Freq: Two times a day (BID) | ORAL | Status: DC

## 2024-02-25 NOTE — Patient Instructions (Addendum)
 Start Ofev 150 mg Twice daily. Take with food  You will need lab monitoring once a month during the first six months and then every 3 months afterwards  Labs today Please call us if you haven't heard something about this is the next 2 weeks from our pharmacy team   Overnight oxygen study on room air  Continue Breztri 2 puffs Twice daily. Brush tongue and rinse mouth afterwards Continue Albuterol inhaler 2 puffs or 3 mL neb every 6 hours as needed for shortness of breath or wheezing. Notify if symptoms persist despite rescue inhaler/neb use.  Continue singulair 1 tab At bedtime Continue daily allergy pill Consider allergy shots  I think you would be a good candidate for biologic therapy with injectable asthma medications. I would like to see how you tolerate your antifibrotic first and then we can consider starting Dupixent at your follow up  Let me know if you want to be referred to pulmonary rehab, as I think this would help you  Follow up in 8 weeks with Florentina Addison Mariah Gerstenberger,NP to see how Mark Doyle is working and then with Dr. Marchelle Gearing in 16 weeks after repeat 30 min PFT. If symptoms do not improve or worsen, please contact office for sooner follow up or seek emergency care.

## 2024-02-25 NOTE — Assessment & Plan Note (Signed)
 Multiple significant environmental allergies. He was seen by Dr. Maurine Minister to establish care 12/2023. Recommendation for immunotherapy but he had declined at the time. We discussed this again today. He is willing to consider it, especially as he lives with two dogs and this is a known allergen for him. He will address this with her at his upcoming f/u.

## 2024-02-25 NOTE — Progress Notes (Signed)
 @Patient  ID: Mark Doyle, male    DOB: May 05, 1956, 68 y.o.   MRN: [id number]  Chief Complaint  Patient presents with   Follow-up    Biopsy f/u    Referring provider: Olive Bass,*  HPI: 68 year old male, never smoker followed for severe obstructive lung disease with longstanding history of asthma and IPF.  He is a patient of Dr. Jane Canary and last seen in office 10/19/2023.  Past medical history significant for CAD, HLD, environmental allergies.  TEST/EVENTS:  01/07/2023 echo: EF 60-65%. LVH. GIDD. RV size and function nl. 01/15/2023 PFT: FVC 46, FEV1 40, ratio 67, TLC 75, DLCOcor 59.  No BD.  Mixed severe restriction and obstruction with diffusion defect 02/23/2023 : no desaturations on room air 05/28/2023 HRCT chest: atherosclerosis. Widespread chronic changes categorized as indeterminate for UIP. Mild air trapping. Mild tracheobronchomalacia. Hepatic steatosis.  06/01/2023 PFT: FVC 55, FEV1 51, ratio 69, DLCOcor 72 09/23/2023 PFT: FVC 53, FEV1 50, ratio 70, DLCOcor 60 06/2023 serologies negative 12/25/2023 positive RAST to multiple environmental allergens; IgE 185; eos 400 01/01/2024 HRCT chest: stable aortic dilation. Atherosclerosis. Pleuroparenchymal scarring in posterior lower right hemithorax. Superimposed mild subpleural reticular densities and ground glass in later and posterior aspects of mid and lower lung zones, as on 05/28/2023. Probable minimal bronchiolectasis. Calcified granulomas. Air trapping. Probable UIP.  01/11/2024 lung biopsy >> chronic interstitial fibroinflammatory process, UIP.  Spatial and temporal heterogeneity.  No granulomas or organizing pneumonia  11/24/2022: OV with Dr. Tonia Brooms.  Follow-up regarding severe obstructive disease with longstanding history of asthma.  Has been using Breztri regularly.  Had a recent CT scan of the chest that showed some peripheral interstitial changes. 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  Possible underlying interstitial lung disease.  Will have him follow-up with Dr. Marchelle Gearing in the ILD clinic.  Provided with ILD questionnaire.  Plan to repeat PFTs and obtain HRCT in 6 months.  02/23/2023: OV with Diquan Kassis NP for follow-up after 6-minute walk test, ordered by Dr. Tonia Brooms.  He also had overnight oximetry, which hhe completed last night and returned this morning.  He did not have any desaturations on his with SpO2 low of 94%.  ONO results have yet to be received.  He tells me today that he is feeling relatively the same as when he was here last.  Still has shortness of breath with exertion, including ADLs and walking on a flat surface at his own speed.  He has a daily, productive cough with clear sputum.  No increased chest congestion or wheezing.  He is using his Breztri inhaler, usually 4 times a day.  Does not use his albuterol rescue inhaler much.  He does feel like he benefits from use of the [place].  No recent hospitalizations.  Has not required any steroids or antibiotics in the last 6 months.  06/23/2023: OV with Dr. Marchelle Gearing.  Referred to ILD clinic.  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  Over 30 years ago.  Moved to [place] after this.  Currently retired.  Told that he had pulmonary lung disease which resulted in a CT scan of his chest that showed "fibrosis".  Symptoms are progressive.  Since his last visit no significant change.  Has an insidious onset of shortness of breath gradually getting worse for the last 3 years.  Has had asthma his whole life.  Cough is worse at night.  Does  produce some yellow phlegm.  He is on inhaler for this.  Does clear his throat. Concern for ILD.  Serologies obtained and negative.  Walk test without desaturations. ONO ordered on room air.  Plan to repeat spirometry/DLCO in 2 to 3 months.  RAST allergy panel also ordered for further evaluation [facility] ILD  Questionnaire    Past Medical History :  -He states he was born with asthma.  He is also suffers from obesity - Denies any collagen vascular disease or connective tissue disease or vasculitis - Denies any heart disease Denies pulmonary hypertension diabetes thyroid disease.  Denies any strokes denies kidney disease. -Never had COVID-vaccine.  Never had COVID disease.     ROS:  Detailed review of systems is negative for any symptoms other than shortness of breath.   FAMILY HISTORY of LUNG DISEASE:  -Asthma present to the mother but otherwise detailed family history for pulmonary disease including connective tissue disease and premature graying of the head is negative.   PERSONAL EXPOSURE HISTORY:  -Never smoked any cigarettes.  He has done some passive smoking.  He did smoke some marijuana 1974 very little.  No cocaine no intravenous drug use   HOME  EXPOSURE and HOBBY DETAILS :  -He lives in a single-[place] home in the [place] for the last 17 years.  Home was built in [place].  There is mold/mildew in the main bathroom on the ceiling and sometimes uses bleach to get rid of it.  He did have a water leak under the house in 2022 but it dried out.  Otherwise detail organic antigen exposure history in the house is negative   OCCUPATIONAL HISTORY (122 questions) : -He is retired.  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  He has been exposed to diabetic condition spaces.  Exposed to warehouses.  Has done some woodwork.  Exposed to asbestos.  Exposed to metal grinding   PULMONARY TOXICITY HISTORY (27 items):   = Detail organic antigen exposure history in the house is negative.  No medications for pulmonary eosinophilia   INVESTIGATIONS: x    10/19/2023: OV with Dr. Marchelle Gearing.  Reviewed at ILD conference 08/04/2023.  Consensus was to undergo biopsy if serologies were negative.  Reviewed results at follow-up.  Serologies were  negative.  CT is read as indeterminate for UIP.  Could be IPF versus HP versus NSIP.  Discussed getting a definitive biopsy with surgical lung biopsy.  Does have elevated peripheral eosinophils and significant positives on his RAST panel.  Referral to allergist placed.  Advised to continue [facility] for asthma management.  Referred to cardiothoracic surgery.  SYMPTOM SCALE - ILD 06/24/2023 10/19/2023    Current weight      O2 use ra ra  Shortness of Breath 0 -> 5 scale with 5 being worst (score 6 If unable to do)    At rest 3 1  Simple tasks - showers, clothes change, eating, shaving 3 3  Household (dishes, doing bed, laundry) 3 3  Shopping 3 4  Walking level at own pace 4 4  Walking up Stairs 5 5  Total (30-36) Dyspnea Score 21 20     Non-dyspnea symptoms (0-> 5 scale) 06/24/2023 10/19/2023    How bad is your cough? 4 3  How bad is your fatigue 4 3  How bad is nausea 0 0  How bad is vomiting?   0 0  How  bad is diarrhea? 0 0  How bad is anxiety? 4 0  How bad is depression 3 0  Any chronic pain - if so where and how bad x 0    Simple office walk 224 (66+46 x 2) feet Pod A at [facility] x  3 laps goal with forehead probe 06/23/2023   10/19/2023    O2 used ra ra  Number laps completed Sit stand  x 10 Sit and stand x 10  Comments about pace good    Resting Pulse Ox/HR 96% and 77/min 99% and nd 93  Final Pulse Ox/HR 96% and 91/min 97% and 112  Desaturated </= 88% no    Desaturated <= 3% points no    Got Tachycardic >/= 90/min yes    Symptoms at end of test Mild dyspnea    Miscellaneous comments x     02/25/2024: Today-follow-up Patient presents today for overdue follow-up with his sister-in-law, who is a former Engineer, civil (consulting).  Since his last visit he had surgical lung biopsy with Dr. Dorris Fetch 01/11/2024.  Biopsy revealed chronic interstitial fibroinflammatory process, most consistent with UIP.  No granulomas were present.  He has done relatively well since his surgery aside from persistent right  sided pain, especially with coughing.  He has been taking tramadol for this.  He is almost out of this.  Otherwise, his breathing has been stable.  He does get short winded walking to the mailbox and back.  Does have an occasional cough with white to yellow phlegm, which is baseline for him.  Has not noticed any wheezing, fevers, chills, night sweats.  No issues with lower extremity swelling, orthopnea, PND.  He is on any supplemental oxygen.  Previous ONO on room air was ordered but I do not see any results for this documented in the chart.  He also had allergy workup with Dr. Maurine Minister in January 2025.  Has multiple environmental allergies.  She recommended immunotherapy as well as possible biologic therapy for his asthma management.  He has not been started on either 1 of these.  Does have 2 dogs at the house and had positive RAST to dog dander.  At the time, he did not really want to consider immunotherapy but was open to injectable medicines for asthma management.  Has not been started on anything at this point.  Still using his Breztri twice a day.  He does use his albuterol a couple times a day as well, which has been normal for him for quite some time now.  Does feel like this helps.  Has not had any steroids or antibiotics recently.  He is taking Xyzal and Singulair daily.  No Known Allergies   There is no immunization history on file for this patient.  Past Medical History:  Diagnosis Date   Asthma    COPD (chronic obstructive pulmonary disease) (HCC)    Coronary artery disease    Dyslipidemia    Dyspnea    with exertion   Environmental allergies    GERD (gastroesophageal reflux disease)    Hypertension    hx but no current meds per pt as of 01/07/24   ILD (interstitial lung disease) (HCC)    right   Macular degeneration     Tobacco History: Social History   Tobacco Use  Smoking Status Never  Smokeless Tobacco Current   Types: Chew   Ready to quit: Not Answered Counseling  given: Not Answered   Outpatient Medications Prior to Visit  Medication Sig Dispense Refill  acetaminophen (TYLENOL) 500 MG tablet Take 1-2 tablets (500-1,000 mg total) by mouth every 6 (six) hours as needed. 30 tablet 0   albuterol (PROVENTIL) (2.5 MG/3ML) 0.083% nebulizer solution Take 3 mLs (2.5 mg total) by nebulization every 2 (two) hours as needed for wheezing or shortness of breath. 150 mL 5   albuterol (VENTOLIN HFA) 108 (90 Base) MCG/ACT inhaler INHALE 2 PUFFS INTO THE LUNGS EVERY 4 HOURS AS NEEDED FOR WHEEZING OR SHORTNESS OF BREATH. 18 each 2   aspirin 81 MG chewable tablet Chew 81 mg by mouth in the morning.     atorvastatin (LIPITOR) 20 MG tablet Take 1 tablet (20 mg total) by mouth daily. 30 tablet 0   Budeson-Glycopyrrol-Formoterol (BREZTRI AEROSPHERE) 160-9-4.8 MCG/ACT AERO Inhale 2 puffs into the lungs in the morning and at bedtime.     gabapentin (NEURONTIN) 300 MG capsule Take 1 capsule (300 mg total) by mouth at bedtime. 30 capsule 3   levocetirizine (XYZAL) 5 MG tablet Take 1 tablet (5 mg total) by mouth every evening. 30 tablet 5   montelukast (SINGULAIR) 10 MG tablet Take 1 tablet (10 mg total) by mouth at bedtime. 32 tablet 5   mupirocin ointment (BACTROBAN) 2 % Place 1 Application into the nose 2 (two) times daily for 4 days 22 g 0   omeprazole (PRILOSEC) 20 MG capsule Take 1 capsule (20 mg total) by mouth daily. 90 capsule 3   Spacer/Aero-Holding Chambers (AEROCHAMBER MV) inhaler Use as instructed (Patient taking differently: 1 each by Other route as directed. Use as instructed) 1 each 0   traMADol (ULTRAM) 50 MG tablet Take 1 tablet (50 mg total) by mouth every 8 (eight) hours as needed. 20 tablet 0   triamcinolone cream (KENALOG) 0.1 % APPLY TO AFFECTED AREA TWICE A DAY (Patient taking differently: Apply 1 Application topically 2 (two) times daily.) 30 g 0   ARNUITY ELLIPTA 100 MCG/ACT AEPB TAKE 1 PUFF BY MOUTH EVERY DAY (Patient taking differently: Inhale 1 puff into  the lungs daily.) 30 each 5   No facility-administered medications prior to visit.     Review of Systems:   Constitutional: No weight loss or gain, night sweats, fevers, chills, or lassitude. +fatigue (baseline) HEENT: No headaches, difficulty swallowing, tooth/dental problems, or sore throat. No sneezing, itching, ear ache +nasal congestion, post nasal drip CV:  No chest pain, orthopnea, PND, swelling in lower extremities, anasarca, dizziness, palpitations, syncope Resp: +shortness of breath with exertion; chronic productive cough. No hemoptysis. No wheezing.  No chest wall deformity GI:  No heartburn, indigestion, abdominal pain, nausea, vomiting, diarrhea, change in bowel habits, loss of appetite GU: No dysuria, change in color of urine, urgency or frequency.   Skin: No rash, lesions, ulcerations MSK:  No joint pain or swelling. +right sided chest wall pain post op Neuro: No dizziness or lightheadedness.  Psych: No depression or anxiety. Mood stable.     Physical Exam:  BP (!) 142/91 (BP Location: Right Arm, Patient Position: Sitting, Cuff Size: Large)   Pulse 93   Ht 6\' 2"  (1.88 m)   Wt 243 lb 3.2 oz (110.3 kg)   SpO2 96%   BMI 31.23 kg/m   GEN: Pleasant, interactive, well-appearing; in no acute distress. HEENT:  Normocephalic and atraumatic. PERRLA. Sclera white. Nasal turbinates pink, moist and patent bilaterally. No rhinorrhea present. Oropharynx pink and moist, without exudate or edema. No lesions, ulcerations, or postnasal drip.  NECK:  Supple w/ fair ROM. No JVD present. Normal carotid impulses  w/o bruits. Thyroid symmetrical with no goiter or nodules palpated. No lymphadenopathy.   CV: RRR, no m/r/g, no peripheral edema. Pulses intact, +2 bilaterally. No cyanosis, pallor or clubbing. PULMONARY:  Unlabored, regular breathing. Minimal bibasilar crackles otherwise clear bilaterally A&P w/o wheezes/rales/rhonchi. No accessory muscle use.  GI: BS present and normoactive.  Soft, non-tender to palpation. No organomegaly or masses detected.  MSK: No erythema, warmth or tenderness. Cap refil <2 sec all extrem. No deformities or joint swelling noted.  Neuro: A/Ox3. No focal deficits noted.   Skin: Warm, no lesions or rashe Psych: Normal affect and behavior. Judgement and thought content appropriate.     Lab Results:  CBC    Component Value Date/Time   WBC 13.5 (H) 01/13/2024 0212   RBC 3.89 (L) 01/13/2024 0212   HGB 13.2 01/13/2024 0212   HGB 16.2 12/25/2023 1009   HCT 39.2 01/13/2024 0212   HCT 47.1 12/25/2023 1009   PLT 213 01/13/2024 0212   PLT 325 12/25/2023 1009   MCV 100.8 (H) 01/13/2024 0212   MCV 98 (H) 12/25/2023 1009   MCH 33.9 01/13/2024 0212   MCHC 33.7 01/13/2024 0212   RDW 13.2 01/13/2024 0212   RDW 12.3 12/25/2023 1009   LYMPHSABS 1.3 12/25/2023 1009   MONOABS 0.8 06/23/2023 1215   EOSABS 0.4 12/25/2023 1009   BASOSABS 0.1 12/25/2023 1009    BMET    Component Value Date/Time   NA 130 (L) 02/25/2024 1532   K 3.9 02/25/2024 1532   CL 98 02/25/2024 1532   CO2 21 02/25/2024 1532   GLUCOSE 95 02/25/2024 1532   BUN 5 (L) 02/25/2024 1532   CREATININE 0.62 02/25/2024 1532   CALCIUM 9.0 02/25/2024 1532   GFRNONAA >60 01/13/2024 0212    BNP No results found for: "BNP"   Imaging:  No results found.   regadenoson (LEXISCAN) injection SOLN 0.4 mg     Date Action Dose Route User   Discharged on 01/13/2024   Admitted on 01/11/2024   01/05/2024 0940 Given 0.4 mg Intravenous Lance Sell E      technetium tetrofosmin (TC-MYOVIEW) injection 10.7 millicurie     Date Action Dose Route User   Discharged on 01/13/2024   Admitted on 01/11/2024   01/05/2024 0815 Contrast Given 10.7 millicurie Intravenous Lance Sell E      technetium tetrofosmin (TC-MYOVIEW) injection 32.1 millicurie     Date Action Dose Route User   Discharged on 01/13/2024   Admitted on 01/11/2024   01/05/2024 0940 Contrast Given 32.1 millicurie Intravenous  Mercy Moore          Latest Ref Rng & Units 09/23/2023    9:59 AM 06/01/2023    2:42 PM 01/15/2023   10:42 AM 10/02/2021    9:14 AM  PFT Results  FVC-Pre L 2.80  2.94  2.38  2.10   FVC-Predicted Pre % 53  55  46  40   FVC-Post L   2.35  2.28   FVC-Predicted Post %   46  44   Pre FEV1/FVC % % 70  69  65  58   Post FEV1/FCV % %   67  60   FEV1-Pre L 1.97  2.02  1.54  1.22   FEV1-Predicted Pre % 50  51  40  31   FEV1-Post L   1.58  1.38   DLCO uncorrected ml/min/mmHg 18.09  21.73  17.32  17.43   DLCO UNC% % 60  72  59  59  DLCO corrected ml/min/mmHg 18.09  21.73  17.27  17.43   DLCO COR %Predicted % 60  72  59  59   DLVA Predicted % 83  99  93  97   TLC L   5.72  5.84   TLC % Predicted %   75  76   RV % Predicted %   111  132     No results found for: "NITRICOXIDE"      Assessment & Plan:   ILD (interstitial lung disease) (HCC) ILD; initially indeterminate for UIP on initial CT imaging. Most recent HRCT chest from 12/2023 with probable UIP. He underwent surgical lung biopsy on 01/11/2024 with Dr. Dorris Fetch. Pathology report consistent with UIP. There are no granulomas present which rules out chronic HP, no asbestos plaques which rules out asbestosis, and serologies were negative ruling out CT disease process such as RA which leaves him with a formal diagnosis of IPF. We reviewed disease process at length and progression of disease. Advised initiating therapy with antifibrotic. He does have a history of CAD; however, no hx of significant obstructive disease and recent stress testing was normal. Advised to seek emergency care if chest pain developed. Reviewed safety profile/side effect profile of Ofev with pt and family, who verbalized understanding. Enrollment paperwork completed and sent to pharmacy team to process. CMET today. Will need monthly monitoring for 6 months then every 3 months while on therapy.  Encouraged to work on graded exercises and consider referral to  pulmonary rehab. Advised him to avoid sick exposures and triggers. Stay up to date on vaccines. He has declined the flu vaccine; reviewed risks of influenza infection.  Repeat spiro/DLCO 3 months after initiation of antifibrotic therapy.  Pt consented with [facility] for potential clinical trial   Patient Instructions  Start Ofev 150 mg Twice daily. Take with food  You will need lab monitoring once a month during the first six months and then every 3 months afterwards  Labs today Please call us if you haven't heard something about this is the next 2 weeks from our pharmacy team   Overnight oxygen study on room air  Continue Breztri 2 puffs Twice daily. Brush tongue and rinse mouth afterwards Continue Albuterol inhaler 2 puffs or 3 mL neb every 6 hours as needed for shortness of breath or wheezing. Notify if symptoms persist despite rescue inhaler/neb use.  Continue singulair 1 tab At bedtime Continue daily allergy pill Consider allergy shots  I think you would be a good candidate for biologic therapy with injectable asthma medications. I would like to see how you tolerate your antifibrotic first and then we can consider starting Dupixent at your follow up  Let me know if you want to be referred to pulmonary rehab, as I think this would help you  Follow up in 8 weeks with Florentina Addison Dennisha Mouser,NP to see how Durel Salts is working and then with Dr. Marchelle Gearing in 16 weeks after repeat 30 min PFT. If symptoms do not improve or worsen, please contact office for sooner follow up or seek emergency care.    Asthma Severe COPD in never smoker with longstanding asthma history. Significant allergic component. Suspect he would benefit from biologic therapy with Dupixent. We discussed this today and he would be open to therapy. No contraindications to start with Ofev but would like to have him establish antifibrotic therapy first and monitor response to avoid confusing any potential side effects. If doing well at follow up,  will start Dupixent therapy. Side  effect profile reviewed. In interim, continue aggressive allergy regimen and triple therapy inhaler with Breztri. Action plan in place.  Seasonal and perennial allergic rhinitis Multiple significant environmental allergies. He was seen by Dr. Maurine Minister to establish care 12/2023. Recommendation for immunotherapy but he had declined at the time. We discussed this again today. He is willing to consider it, especially as he lives with two dogs and this is a known allergen for him. He will address this with her at his upcoming f/u.     I spent 60 minutes of dedicated to the care of this patient on the date of this encounter to include pre-visit review of records, face-to-face time with the patient discussing conditions above, post visit ordering of testing, clinical documentation with the electronic health record, making appropriate referrals as documented, and communicating necessary findings to members of the patients care team.  Noemi Chapel, NP 02/25/2024  Pt aware and understands NP's role.

## 2024-02-25 NOTE — Assessment & Plan Note (Signed)
 Severe COPD in never smoker with longstanding asthma history. Significant allergic component. Suspect he would benefit from biologic therapy with Dupixent. We discussed this today and he would be open to therapy. No contraindications to start with Ofev but would like to have him establish antifibrotic therapy first and monitor response to avoid confusing any potential side effects. If doing well at follow up, will start Dupixent therapy. Side effect profile reviewed. In interim, continue aggressive allergy regimen and triple therapy inhaler with Breztri. Action plan in place.

## 2024-02-25 NOTE — Assessment & Plan Note (Addendum)
 ILD; initially indeterminate for UIP on initial CT imaging. Most recent HRCT chest from 12/2023 with probable UIP. He underwent surgical lung biopsy on 01/11/2024 with Dr. Dorris Fetch. Pathology report consistent with UIP. There are no granulomas present which rules out chronic HP, no asbestos plaques which rules out asbestosis, and serologies were negative ruling out CT disease process such as RA which leaves him with a formal diagnosis of IPF. We reviewed disease process at length and progression of disease. Advised initiating therapy with antifibrotic. He does have a history of CAD; however, no hx of significant obstructive disease and recent stress testing was normal. Advised to seek emergency care if chest pain developed. Reviewed safety profile/side effect profile of Ofev with pt and family, who verbalized understanding. Enrollment paperwork completed and sent to pharmacy team to process. CMET today. Will need monthly monitoring for 6 months then every 3 months while on therapy.  Encouraged to work on graded exercises and consider referral to pulmonary rehab. Advised him to avoid sick exposures and triggers. Stay up to date on vaccines. He has declined the flu vaccine; reviewed risks of influenza infection.  Repeat spiro/DLCO 3 months after initiation of antifibrotic therapy.  Pt consented with [facility] for potential clinical trial   Patient Instructions  Start Ofev 150 mg Twice daily. Take with food  You will need lab monitoring once a month during the first six months and then every 3 months afterwards  Labs today Please call us if you haven't heard something about this is the next 2 weeks from our pharmacy team   Overnight oxygen study on room air  Continue Breztri 2 puffs Twice daily. Brush tongue and rinse mouth afterwards Continue Albuterol inhaler 2 puffs or 3 mL neb every 6 hours as needed for shortness of breath or wheezing. Notify if symptoms persist despite rescue inhaler/neb use.   Continue singulair 1 tab At bedtime Continue daily allergy pill Consider allergy shots  I think you would be a good candidate for biologic therapy with injectable asthma medications. I would like to see how you tolerate your antifibrotic first and then we can consider starting Dupixent at your follow up  Let me know if you want to be referred to pulmonary rehab, as I think this would help you  Follow up in 8 weeks with Florentina Addison Lannie Heaps,NP to see how Durel Salts is working and then with Dr. Marchelle Gearing in 16 weeks after repeat 30 min PFT. If symptoms do not improve or worsen, please contact office for sooner follow up or seek emergency care.

## 2024-02-26 ENCOUNTER — Encounter: Payer: Self-pay | Admitting: Nurse Practitioner

## 2024-02-26 ENCOUNTER — Telehealth: Payer: Self-pay

## 2024-02-26 DIAGNOSIS — Z5181 Encounter for therapeutic drug level monitoring: Secondary | ICD-10-CM

## 2024-02-26 DIAGNOSIS — J84112 Idiopathic pulmonary fibrosis: Secondary | ICD-10-CM

## 2024-02-26 LAB — ACID FAST CULTURE WITH REFLEXED SENSITIVITIES (MYCOBACTERIA): Acid Fast Culture: NEGATIVE

## 2024-02-26 NOTE — Telephone Encounter (Signed)
 Received New start paperwork for OFEV. Will update as we work through the benefits process.  Submitted a Prior Authorization request to [facility] for OFEV via CoverMyMeds. Will update once we receive a response.  Key: [id number]

## 2024-03-02 ENCOUNTER — Other Ambulatory Visit: Payer: Self-pay | Admitting: Family

## 2024-03-02 ENCOUNTER — Other Ambulatory Visit (HOSPITAL_COMMUNITY): Payer: Self-pay

## 2024-03-02 ENCOUNTER — Other Ambulatory Visit: Payer: Self-pay | Admitting: Pulmonary Disease

## 2024-03-02 MED ORDER — OFEV 150 MG PO CAPS
150.0000 mg | ORAL_CAPSULE | Freq: Two times a day (BID) | ORAL | 1 refills | Status: DC
Start: 2024-03-02 — End: 2024-11-01

## 2024-03-02 NOTE — Telephone Encounter (Signed)
 Rx for Ofev sent 150mg  twice daily sent to [facility]. ATC patient to discuss. Unable to reach as phone went straight to VM. Left VM with pharmacy phone and my callback number  Chesley Mires, PharmD, MPH, BCPS, CPP Clinical Pharmacist (Rheumatology and Pulmonology)

## 2024-03-02 NOTE — Telephone Encounter (Signed)
 Received notification from [facility] regarding a prior authorization for OFEV. Authorization has been APPROVED from 02/26/2024 to 12/21/2024. Approval letter sent to scan center.  Per test claim, copay for 30 days supply is $0.00  Patient can fill through [facility]: [phone]   Authorization # [id number]

## 2024-03-07 NOTE — Telephone Encounter (Signed)
 Spoke with patient regarding Ofev. He states he was planning to start allergy shots tomorrow. Based on Katie [name]'s note, she wanted him to start Ofev first and then initiate allergy shots in about a month. This is to ensure tolerability with Ofev and minimize overlap of any possible side effects (unlikely).  Patient will call [facility] back to schedule shipment of Ofev to home. He will call allergist to r/s appt for one month after he start Ofev. He will call clinic when he plans to get labs completed with PCP  Chesley Mires, PharmD, MPH, BCPS, CPP Clinical Pharmacist (Rheumatology and Pulmonology)

## 2024-03-08 ENCOUNTER — Ambulatory Visit: Admitting: Internal Medicine

## 2024-03-21 DIAGNOSIS — G4730 Sleep apnea, unspecified: Secondary | ICD-10-CM | POA: Diagnosis not present

## 2024-03-21 DIAGNOSIS — R0902 Hypoxemia: Secondary | ICD-10-CM | POA: Diagnosis not present

## 2024-03-24 ENCOUNTER — Ambulatory Visit: Payer: 59 | Admitting: Internal Medicine

## 2024-03-25 ENCOUNTER — Other Ambulatory Visit: Payer: Self-pay | Admitting: Family

## 2024-03-25 ENCOUNTER — Ambulatory Visit: Payer: Self-pay | Admitting: Nurse Practitioner

## 2024-03-25 ENCOUNTER — Other Ambulatory Visit: Payer: Self-pay | Admitting: Nurse Practitioner

## 2024-03-25 ENCOUNTER — Telehealth: Payer: Self-pay

## 2024-03-25 NOTE — Telephone Encounter (Signed)
 Routing to DOD

## 2024-03-25 NOTE — Telephone Encounter (Addendum)
 See NT note.  Copied from CRM [id number]. Topic: Clinical - Red Word Triage >> Mar 25, 2024 12:05 PM Payton Doughty wrote: Red Word that prompted transfer to Nurse Triage: med side effects

## 2024-03-25 NOTE — Telephone Encounter (Signed)
 Chief Complaint: medication side effects Symptoms: diarrhea, urinary hesitancy Frequency: x 1 week Pertinent Negatives: Patient denies fever, URI sx, SOB, CP Disposition: [] ED /[] Urgent Care (no appt availability in office) / [] Appointment(In office/virtual)/ []  [facility]/ [] Home Care/ [] Refused Recommended Disposition /[] [facility]/ [x]  Follow-up with Pulm Additional Notes: Pt c/o medication side effect of diarrhea and urinary hesitancy x 1 week. Pt reports starting Nintedanib (OFEV) 150 MG CAPS 2.5 weeks ago. Pt denies any signs of dehydration or acute respiratory sx. Pt no longer wishes to continue medication d/t side effects. Triager will forward encounter for [name], NP to review and advise. Patient verbalized understanding and is expecting call back from office for next steps. Triager also advised pt to call back for any worsening of sx/signs of dehydration.   Copied from CRM [id number]. Topic: Clinical - Red Word Triage >> Mar 25, 2024 12:05 PM Mark Doyle wrote: Red Word that prompted transfer to Nurse Triage: med side effects Reason for Disposition  [1] Caller has URGENT medicine question about med that PCP or specialist prescribed AND [2] triager unable to answer question  Answer Assessment - Initial Assessment Questions E2C2 Pulmonary Triage - Initial Assessment Questions "Chief Complaint (e.g., cough, sob, wheezing, fever, chills, sweat or additional symptoms) *Go to specific symptom protocol after initial questions. Medication side effects - Nintedanib (OFEV) 150 MG CAPS Diarrhea, inability to urinate  "How long have symptoms been present?" X 1 week Pt reports being on medication x 2.5 weeks   "Have you used your inhalers/maintenance medication?" Yes If yes, "What medications?" Albuterol PRN - reports daily use Breztri - twice daily, 2-3 puffs  If inhaler, ask "How many puffs and how often?" Note: Review instructions on medication in the chart. See  above  OXYGEN: "Do you wear supplemental oxygen?" No If yes, "How many liters are you supposed to use?" N/a  "Do you monitor your oxygen levels?" No If yes, "What is your reading (oxygen level) today?" N/a  "What is your usual oxygen saturation reading?"  (Note: Pulmonary O2 sats should be 90% or greater) N/a   1. NAME of MEDICINE: "What medicine(s) are you calling about?"     Nintedanib (OFEV) 150 MG CAPS 2. QUESTION: "What is your question?" (e.g., double dose of medicine, side effect)     Diarrhea, troubles urinating 3. PRESCRIBER: "Who prescribed the medicine?" Reason: if prescribed by specialist, call should be referred to that group.     Murrell Redden, RPH-CPP - per chart Pt reports Mark Croft, NP 4. SYMPTOMS: "Do you have any symptoms?" If Yes, ask: "What symptoms are you having?"  "How bad are the symptoms (e.g., mild, moderate, severe)     See above  Answer Assessment - Initial Assessment Questions 1. DIARRHEA SEVERITY: "How bad is the diarrhea?" "How many more stools have you had in the past 24 hours than normal?"    - NO DIARRHEA (SCALE 0)   - MILD (SCALE 1-3): Few loose or mushy BMs; increase of 1-3 stools over normal daily number of stools; mild increase in ostomy output.   -  MODERATE (SCALE 4-7): Increase of 4-6 stools daily over normal; moderate increase in ostomy output.   -  SEVERE (SCALE 8-10; OR "WORST POSSIBLE"): Increase of 7 or more stools daily over normal; moderate increase in ostomy output; incontinence.     2 x in past 24 hrs  3. BM CONSISTENCY: "How loose or watery is the diarrhea?"      Liquid brown  4. VOMITING: "Are you also vomiting?" If Yes, ask: "How many times in the past 24 hours?"      denies 5. ABDOMEN PAIN: "Are you having any abdomen pain?" If Yes, ask: "What does it feel like?" (e.g., crampy, dull, intermittent, constant)      denies 6. ABDOMEN PAIN SEVERITY: If present, ask: "How bad is the pain?"  (e.g., Scale 1-10; mild, moderate, or  severe)   - MILD (1-3): doesn't interfere with normal activities, abdomen soft and not tender to touch    - MODERATE (4-7): interferes with normal activities or awakens from sleep, abdomen tender to touch    - SEVERE (8-10): excruciating pain, doubled over, unable to do any normal activities       denies  8. HYDRATION: "Any signs of dehydration?" (e.g., dry mouth [not just dry lips], too weak to stand, dizziness, new weight loss) "When did you last urinate?"     denies 11. OTHER SYMPTOMS: "Do you have any other symptoms?" (e.g., fever, blood in stool)       Urinary issues - "not coming out when I need to go" "I have to stand there and wait for it" Reports decreased UOP "a little" Reports having UOP  about 3x daily  Protocols used: Medication Question Call-A-AH, Diarrhea-A-AH

## 2024-03-25 NOTE — Telephone Encounter (Signed)
 Copied from CRM [id number]. Topic: Clinical - Prescription Issue >> Mar 24, 2024 10:46 AM Duncan Dull wrote: Reason for CRM: patient wants to speak to a nurse regarding the [facility] medicine he was prescribed, he says it is causing him side affects he dont like and he no longer wants to take it, please call patient at [phone]  ATC x1 went to voicemail unable to leave message.

## 2024-03-25 NOTE — Telephone Encounter (Signed)
 I called and spoke with the pt and notified of response from [facility]  He verbalized understanding  Nothing further needed

## 2024-03-25 NOTE — Telephone Encounter (Addendum)
 Please ask patient to reach out to [facility] clinical nurse educator to help manage his symptoms.  Debbra Riding [phone]   If persistent we can try stopping for a week or two and restarting at 100mg  twice daily.

## 2024-03-28 ENCOUNTER — Other Ambulatory Visit: Payer: Self-pay | Admitting: Nurse Practitioner

## 2024-03-28 ENCOUNTER — Telehealth: Payer: Self-pay

## 2024-03-28 ENCOUNTER — Other Ambulatory Visit: Payer: Self-pay

## 2024-03-28 DIAGNOSIS — J849 Interstitial pulmonary disease, unspecified: Secondary | ICD-10-CM

## 2024-03-28 MED ORDER — BREZTRI AEROSPHERE 160-9-4.8 MCG/ACT IN AERO: 2.0000 | INHALATION_SPRAY | Freq: Two times a day (BID) | RESPIRATORY_TRACT | 3 refills | Status: DC

## 2024-03-28 NOTE — Telephone Encounter (Signed)
 Copied from CRM [id number]. Topic: Clinical - Medication Refill >> Mar 28, 2024 10:45 AM Gaetano Hawthorne wrote: Most Recent Primary Care Visit:  Provider: Ria Clock [facility]  Department: [facility]  Visit Type: OFFICE VISIT  Date: 10/27/2023  Medication: Budeson-Glycopyrrol-Formoterol (BREZTRI AEROSPHERE) 160-9-4.8 MCG/ACT AERO  Has the patient contacted their pharmacy? Yes, pharmacy received a denial message, refill request was sent under old doctor's name. (Agent: If no, request that the patient contact the pharmacy for the refill. If patient does not wish to contact the pharmacy document the reason why and proceed with request.) (Agent: If yes, when and what did the pharmacy advise?)  Is this the correct pharmacy for this prescription? Yes If no, delete pharmacy and type the correct one.  This is the patient's preferred pharmacy:  [id number] - [place], [place] - [address] [place] [address] Phone: [phone] Fax: [phone]   Has the prescription been filled recently? No  Is the patient out of the medication? Yes  Has the patient been seen for an appointment in the last year OR does the patient have an upcoming appointment? Yes  Can we respond through MyChart? Yes  Agent: Please be advised that Rx refills may take up to 3 business days. We ask that you follow-up with your pharmacy.

## 2024-03-28 NOTE — Telephone Encounter (Signed)
**Note De-identified  [place] Obfuscation** Please advise 

## 2024-03-28 NOTE — Telephone Encounter (Signed)
 Last Fill: Unknown, Historical Provider  Last OV: 02/25/24 Next OV: 04/27/24  Routing to provider for review/authorization.

## 2024-03-28 NOTE — Telephone Encounter (Signed)
 Reason for CRM: Marchelle Folks with answering service calling to relay after hours message. Patient wanted to advise Dr. Allison Quarry he will not be saking OSEV anymore sure to side effects and would like to discuss.                Pt calling again due to OFEV side affects.  Pt has been complaining of side affects including problems urinating (not able to urinate or takes a while) and diarrhea.  Pt stopped OFEV over 1 week ago & has also not contacted [facility] clinical nurse educator [place] yet.   Routing to DOD please advise.

## 2024-03-28 NOTE — Telephone Encounter (Signed)
 Fine to stay off it for now. Mark Doyle can address when she gets back. Not urgent. Still recommend he reach out to [facility] clinical nurse educator that can help manage his symptoms while taking antifibrotic medication   Mark Doyle [phone]

## 2024-03-28 NOTE — Telephone Encounter (Signed)
 Spoke with patient regarding prior message advised patient per Beth   Fine to stay off it for now. Florentina Addison can address when she gets back. Not urgent. Still recommend he reach out to [facility] clinical nurse educator that can help manage his symptoms while taking antifibrotic medication    Debbra Riding [phone]     Sent In a refill for [facility] for patient to pharmacy of choice . Patient's voice understanding.Nothing else further needed.

## 2024-04-01 ENCOUNTER — Telehealth: Payer: Self-pay | Admitting: Nurse Practitioner

## 2024-04-01 DIAGNOSIS — J9611 Chronic respiratory failure with hypoxia: Secondary | ICD-10-CM

## 2024-04-01 NOTE — Telephone Encounter (Signed)
 03/21/2024 ONO on room air: 1 hr 12 min </88%, SpO2 low 77%  Recommend he start supplemental oxygen 2 lpm at night. Will need new start oxygen order for home concentrator   Spoke with pt and notified of results per [facility].  Pt verbalized understanding and denied any questions. O2 order sent.

## 2024-04-04 NOTE — Telephone Encounter (Signed)
 Copied from CRM [id number]. Topic: Clinical - Medical Advice >> Apr 01, 2024  1:50 PM Alverda Joe S wrote: Reason for CRM: patient says he received a call from a nurse letting him know he neds to schedule overnight breathing test, patient states he can breathe fine but want to know if its truly necessary. Please call patient back.  ATC X1 lvm for patient to call our office back regarding prior message .

## 2024-04-05 NOTE — Telephone Encounter (Signed)
 Please advise on patient's request to cancel O2 order. Thank you!

## 2024-04-05 NOTE — Telephone Encounter (Signed)
 Copied from CRM [id number]. Topic: Clinical - Medical Advice >> Apr 04, 2024  1:32 PM Hilton Lucky wrote: Reason for CRM: Patient is return calling once again for nurse regarding oxygen at night. Patient states he does not think he needs an O2 machine and would like to canca the order. He states he would be open to testing again but feels he breathes fine. States his breath only gets rough when he is active. Please advise.

## 2024-04-06 NOTE — Telephone Encounter (Signed)
 This would be against medical advise and I would not recommend it. He spent over an hour 88% or less with a low oxygen level of 77%. Normal is above 90%. This puts a lot of strain on the heart and can cause long term heart problems as well as pulmonary hypertension, decreased oxygen to the brain which can increase dementia risks/memory problems, negative impacts on the kidneys, etc. He may not necessarily wake up short winded at night because of this; again, it is the long term negative impacts. Thanks.

## 2024-04-06 NOTE — Telephone Encounter (Signed)
 ATC X1. LMTCB

## 2024-04-07 NOTE — Telephone Encounter (Signed)
 I called and spoke to Mark Doyle. Mark Doyle states he is a light sleeper and maybe the watch he had was on wrong. He states he does not have a hard time breathing and has not had any issues. Mark Doyle states he is uncomfortable with having to sleep at night with the O2. Mark Doyle states he did research on the watch and stated they are not 100% accurate. I informed the Mark Doyle that it would be best to keep his O2 for now, and at his upcoming appointment with Canary Ceo, NP on 04-27-24, he could discuss things further with her then. Mark Doyle states he did not like this and did not want to keep the O2 but he would just have to wait. NFN

## 2024-04-15 ENCOUNTER — Encounter: Payer: Self-pay | Admitting: Internal Medicine

## 2024-04-15 ENCOUNTER — Ambulatory Visit (INDEPENDENT_AMBULATORY_CARE_PROVIDER_SITE_OTHER): Admitting: Internal Medicine

## 2024-04-15 VITALS — BP 122/80 | HR 107 | Temp 97.7°F | Resp 19 | Wt 241.6 lb

## 2024-04-15 DIAGNOSIS — J4520 Mild intermittent asthma, uncomplicated: Secondary | ICD-10-CM | POA: Diagnosis not present

## 2024-04-15 DIAGNOSIS — J302 Other seasonal allergic rhinitis: Secondary | ICD-10-CM | POA: Diagnosis not present

## 2024-04-15 DIAGNOSIS — J3089 Other allergic rhinitis: Secondary | ICD-10-CM | POA: Diagnosis not present

## 2024-04-15 DIAGNOSIS — J849 Interstitial pulmonary disease, unspecified: Secondary | ICD-10-CM

## 2024-04-15 MED ORDER — MONTELUKAST SODIUM 10 MG PO TABS: 10.0000 mg | ORAL_TABLET | Freq: Every day | ORAL | 11 refills | Status: AC

## 2024-04-15 MED ORDER — LEVOCETIRIZINE DIHYDROCHLORIDE 5 MG PO TABS: 5.0000 mg | ORAL_TABLET | Freq: Every evening | ORAL | 11 refills | Status: AC

## 2024-04-15 NOTE — Progress Notes (Signed)
 FOLLOW UP Date of Service/Encounter:  04/15/24  Subjective:  Mark Doyle (DOB: 1956/09/02) is a 68 y.o. male who [facility] in re-evaluation of the following: ILD , asthma, allergic rhinitis History obtained from: chart review and patient.  For Review, LV was on 12/25/23  with Dr.Lashauna Arpin seen for intial visit for severe asthma with interstitial lung disease, and allergic rhinitis . See below for summary of history and diagnostics.   Therapeutic plans/changes recommended: discussed consideration of biologic dupixent, pending discussion with pulmonary, and maximizing allergy care-not a candidate for AIT and patient not interested. ----------------------------------------------------- Pertinent History/Diagnostics:  Asthma, severe persistent, not well controlled-with ILD; managed by pulmonary Longstanding history, currently on Breztri  inhaler (2 puffs twice daily). Patient reports significant shortness of breath with physical activity. No recent hospitalizations, ER visits, or oral steroid use. Discussed potential benefit of biologic therapy for asthma control. -current plan: Breztri , now on antifibrolytic from Pulmonary -labs 12/25/23: total IgE 185, AEC 400 -initial spirometry 12/25/23: R 0.68, FEV1 38% pre and 41% post-poor effort. -Spirometry 09/23/23: R 70%, FEV1 50%, FVC 2. 8 L (53%)-airways obstruction with decreased diffusing capacity noted -CT chest 05/28/23:  The appearance of the lungs is stable compared to the prior study, once again categorized as indeterminate for usual interstitial pneumonia (UIP) per current ATS guidelines. Repeat high-resolution chest CT should be considered in 12 months to assess for temporal changes in the appearance of the lung parenchyma. 2. Mild air trapping indicative of small airways disease. 3. Mild tracheobronchomalacia. 4. Hepatic steatosis. 5. Aortic atherosclerosis, in addition to left main and  three-vessel coronary artery disease. Please note that although the presence of coronary artery calcium  documents the presence of coronary artery disease, the severity of this disease and any potential stenosis cannot be assessed on this non-gated CT examination. Assessment for potential risk factor modification, dietary therapy or pharmacologic therapy may be warranted, if clinically indicated. -06/23/23: negative quantiferon gold, ESR elevated (28), normal CCP, negative HP panel, normal aldolase, CK, ANCA profile negative, negative ANA, ENA, DNA/DS, Scl 70, SjoSSA/B, environmental allergy panel (positive to cat dog, indoor and outdoor molds, borderline to dust mites, no pollens obtained) Allergic Rhinitis Patient reports lifelong allergies, including to dogs, molds, and likely pollens. Currently not on any allergy medications. Patient reports sneezing and postnasal drainage. -Start Levocetirizine (Xyzal ) 5 mg for allergy symptoms daily. -labs 12/25/23: positive to dust mite, cat, dog, grass pollen, molds, tree pollen weed pollen -Not a candidate currently for allergy injections due to poor lung function and control, patient also not interested in allergy injections. Interstitial Lung Disease Patient is under the care of a pulmonologist and is scheduled for a lung biopsy. Patient reports rapid decline in respiratory function. Lung biopsy obtained 01/11/24:  LUNG, RIGHT LOWER LOBE WEDGE RESECTION:  - Chronic interstitial fibroinflammatory process, most consistent with  usual interstitial pneumonia (UIP). see comment  B. LUNG, RIGHT LOWER LOBE #2 WEDGE RESECTION:  - Chronic interstitial fibroinflammatory process, most consistent with  usual interstitial pneumonia (UIP). see comment  C. LUNG, RIGHT MIDDLE LOBE WEDGE RESECTION:  - Chronic interstitial fibroinflammatory process, most consistent with  usual interstitial pneumonia (UIP). see comment   -Coordinate care with pulmonologist,  particularly regarding potential use of biologic therapy for asthma in the context of interstitial lung disease. --------------------------------------------------- Today presents for follow-up. Discussed the use of AI scribe software for clinical note transcription with the patient, who gave verbal consent to proceed.  History of Present Illness  Mark Doyle is a 68 year old male with interstitial lung disease and asthma who presents for management of his allergy symptoms.  He has a history of interstitial lung disease and fibrosis, confirmed by a recent biopsy. He was previously on Ofev  (nintedanib) 150 mg but discontinued it a month ago due to severe side effects, including incontinence and difficulty urinating. Despite a suggestion to reduce the dose to 100 mg, he is reluctant to resume due to the severity of the side effects.  He also has allergic rhinitis and is currently taking Xyzal  (levocetirizine) and montelukast  (Singulair ) for allergy symptoms. He is uncertain about their effectiveness but notes no harm from these medications. He experiences shortness of breath during physical activity, which he attributes to asthma and lung fibrosis, but can perform activities like walking in a grocery store and mowing the lawn on a riding mower, albeit slowly.  He manages multiple allergies with prescribed medications. No nasal congestion.       Chart Review: 01/11/24: hospital procedure - lung biopsies Pulm visit 02/25/24: started on Ofev  150 mg BID, continued on Breztri , singulair . Consider Dupixent.  All medications reviewed by clinical staff and updated in chart. No new pertinent medical or surgical history except as noted in HPI.  ROS: All others negative except as noted per HPI.   Objective:  BP 122/80   Pulse (!) 107   Temp 97.7 F (36.5 C) (Temporal)   Resp 19   Wt 241 lb 9.6 oz (109.6 kg)   SpO2 96%   BMI 31.02 kg/m  Body mass index is 31.02 kg/m. Physical  Exam: General Appearance:  Alert, cooperative, no distress, appears stated age  Head:  Normocephalic, without obvious abnormality, atraumatic  Eyes:  Conjunctiva clear, EOM's intact  Ears EACs normal bilaterally and normal TMs bilaterally  Nose: Nares normal, hypertrophic turbinates, normal mucosa, and no visible anterior polyps  Throat: Lips, tongue normal; teeth and gums normal, normal posterior oropharynx  Neck: Supple, symmetrical  Lungs:   clear to auscultation bilaterally, Respirations unlabored, no coughing  Heart:  regular rate and rhythm and no murmur, Appears well perfused  Extremities: No edema  Skin: Skin color, texture, turgor normal and no rashes or lesions on visualized portions of skin  Neurologic: No gross deficits   Labs:  Lab Orders  No laboratory test(s) ordered today    Spirometry:  Tracings reviewed. His effort: Good reproducible efforts. FVC: 2.09L FEV1: 1.56L, 42% predicted FEV1/FVC ratio: 0.70 Interpretation: Nonobstructive ratio, low FEV1, possible restriction.  Please see scanned spirometry results for details.  Assessment/Plan   Asthma and interstitial lung disease with fibrosis. Dupixent discussed but decision deferred to pulmonologist. Allergy injections not an option due to lung fibrosis and poor lung function.  Allergic Rhinitis-at goal Patient reports lifelong allergies, including to dogs, molds, and likely pollens. Currently not on any allergy medications. Patient reports sneezing and postnasal drainage. Interval Hx: taking xyzal  and montelukast  with no rhinitis symptoms.  -continue Levocetirizine (Xyzal ) 5 mg for allergy symptoms daily. -continue montelukast  10 mg nightly as needed-stop if nightmares or behavior problems. -allergy testing positive to  positive to dust mite, cat, dog, grass pollen, molds, tree pollen weed pollen. Allergen avoidance measures. -Consider use of nasal spray if symptoms persist, despite patient's preference for oral  medication. -Not a candidate currently for allergy injections due to poor lung function and control, patient also not interested in allergy injections.  Interstitial Lung Disease-not at goal, managed by Pulm Patient is under the care of  a pulmonologist and is scheduled for a lung biopsy. Patient reports rapid decline in respiratory function. Interval hx: biopsy confirmed ILD. He did not tolerate antifibrinolytic and is now off. Per pulmonary notes, dupixent may be considered as well. Deferring treatment to pulmonary. - Defer management to pulmonologist. - Discuss alternative treatment options with pulmonologist.  Asthma-not at goal, complicated by ILD; deferring care to pulmonary Longstanding history, currently on Breztri  inhaler (2 puffs twice daily). Patient reports significant shortness of breath with physical activity. No recent hospitalizations, ER visits, or oral steroid use. Discussed potential benefit of biologic therapy for asthma control. -deferring care to pulmonary due to concomitant ILD to reduce multiple specialists  Follow-up in 12 months, sooner if needed.    It was a pleasure  seeing you again in clinic today! Thank you for allowing me to participate in your care.  Other: none  Jonathon Neighbors, MD  [facility] 

## 2024-04-15 NOTE — Patient Instructions (Addendum)
 Allergic Rhinitis Patient reports lifelong allergies, including to dogs, molds, and likely pollens. Currently not on any allergy medications. Patient reports sneezing and postnasal drainage. Interval Hx: taking xyzal  and montelukast  with no rhinitis symptoms.  -continue Levocetirizine (Xyzal ) 5 mg for allergy symptoms daily. -continue montelukast  10 mg nightly as needed-stop if nightmares or behavior problems. -allergy testing positive to  positive to dust mite, cat, dog, grass pollen, molds, tree pollen weed pollen. Allergen avoidance measures. -Consider use of nasal spray if symptoms persist, despite patient's preference for oral medication. -Not a candidate currently for allergy injections due to poor lung function and control, patient also not interested in allergy injections.  Interstitial Lung Disease Patient is under the care of a pulmonologist and is scheduled for a lung biopsy. Patient reports rapid decline in respiratory function. Interval hx: biopsy confirmed ILD. He did not tolerate antifibrinolytic and is now off. Per pulmonary notes, dupixent may be considered as well. Deferring treatment to pulmonary. - Defer management to pulmonologist. - Discuss alternative treatment options with pulmonologist.  Asthma Longstanding history, currently on Breztri  inhaler (2 puffs twice daily). Patient reports significant shortness of breath with physical activity. No recent hospitalizations, ER visits, or oral steroid use. Discussed potential benefit of biologic therapy for asthma control. -deferring care to pulmonary due to concomitant ILD to reduce multiple specialists  Follow-up in 12 months, sooner if needed.    It was a pleasure  seeing you again in clinic today! Thank you for allowing me to participate in your care.  Jonathon Neighbors, MD [facility]

## 2024-04-27 ENCOUNTER — Ambulatory Visit: Admitting: Nurse Practitioner

## 2024-04-28 ENCOUNTER — Telehealth: Payer: Self-pay

## 2024-04-28 NOTE — Telephone Encounter (Signed)
 No documentation found in chart to indicate any outbound calls made to patient. No result notes open. No upcoming appts that need to be rescheduled.   Of note, patient had PFT done on 04/21/24, ordered by Dr. Cornel Diesel. Unsure if they are trying to contact him about results.   Will route note to their office also.

## 2024-04-28 NOTE — Telephone Encounter (Signed)
 Copied from CRM [id number]. Topic: General - Other >> Apr 28, 2024  9:27 AM Crist Dominion wrote: Reason for CRM: Patient states today makes 3 times that the clinic has called him but left no documentation in his chart as to why we are calling. Patient states if someone calls him again, please leave the details of your call on his voicemail or to please leave a message in his chart.

## 2024-04-29 NOTE — Telephone Encounter (Signed)
 Noted-note sure who would be calling. Thanks for update.

## 2024-05-03 ENCOUNTER — Telehealth: Payer: Self-pay

## 2024-05-03 NOTE — Telephone Encounter (Signed)
 Copied from CRM [id number]. Topic: Clinical - Order For Equipment >> Apr 29, 2024  3:41 PM Ilene Malling wrote: Reason for CRM: Patient [phone] states was suppose to get oxygen  machine was from  [facility] medical supply and they have not contact patient. Patient contact our the office  a few time and had missed a call that was at 8 am. Patient asked to call back mid morning, he stays up late and will not answer call too early in the morning. Please advise and call back. >> May 03, 2024 12:45 PM Chantha C wrote: Patient has not heard from the office, called last week on oxygen  machine from [facility] medical supply. Also, patient saw the allergist the end of April 2025 and advised patient to see pulmonologist for the allergy shots. Patient wants to speak with nurse today, please advise and call back [phone].  >> May 02, 2024  9:32 AM Rosia Cook wrote: ONO order was sent to [facility], results show [facility] did the ONO, and order was to [facility]. I have refaxed the referral, notes and ONO to [employer] per CRM. Patient does have [facility] Medicare which normally goes to [facility]

## 2024-05-03 NOTE — Telephone Encounter (Signed)
 Spoke with patient and advised order for O2 has been sent to [facility]. He can contact them for updates on request for O2.   Also reviewed Dr. Cornel Diesel' 04/15/24 OV note with him: Not a candidate currently for allergy injections due to poor lung function and control, patient also not interested in allergy injections.   He was under the impression he was a candidate for allergy shots. Explained allergy shots carry risks of side effects and that he is likely not a good candidate at this time, that this may change if his lung function improves. He will keep this in mind, and possibly discuss at upcoming OV with Katie.  Nothing further needed at this time.

## 2024-05-13 NOTE — Telephone Encounter (Signed)
 I called [facility] and spoke with [facility] she confirmed they have the 02 order and will get it processed

## 2024-05-14 ENCOUNTER — Other Ambulatory Visit: Payer: Self-pay | Admitting: Internal Medicine

## 2024-05-23 ENCOUNTER — Other Ambulatory Visit (HOSPITAL_COMMUNITY): Payer: Self-pay

## 2024-05-23 ENCOUNTER — Telehealth: Payer: Self-pay | Admitting: *Deleted

## 2024-05-23 ENCOUNTER — Other Ambulatory Visit: Payer: Self-pay | Admitting: Physician Assistant

## 2024-05-23 NOTE — Telephone Encounter (Signed)
 Patient called requesting a refill of mupirocin . States he has been using daily for "dry nose". Advised patient he was to stop applying to nostrils after 4 days use. Patient states he is also using on cuts/scrapes. Advised patient he needs to discuss refill of ointment with PCP. Patient verbalized understanding.

## 2024-05-26 ENCOUNTER — Encounter: Payer: Self-pay | Admitting: Family

## 2024-05-26 ENCOUNTER — Ambulatory Visit (INDEPENDENT_AMBULATORY_CARE_PROVIDER_SITE_OTHER): Admitting: Family

## 2024-05-26 VITALS — BP 138/86 | HR 93 | Ht 74.0 in | Wt 244.8 lb

## 2024-05-26 DIAGNOSIS — J3489 Other specified disorders of nose and nasal sinuses: Secondary | ICD-10-CM

## 2024-05-26 DIAGNOSIS — J849 Interstitial pulmonary disease, unspecified: Secondary | ICD-10-CM

## 2024-05-26 MED ORDER — MUPIROCIN 2 % EX OINT: 1.0000 | TOPICAL_OINTMENT | Freq: Two times a day (BID) | CUTANEOUS | 0 refills | Status: DC

## 2024-05-26 NOTE — Progress Notes (Signed)
 Mark Doyle is a 68 y.o. male with the following history as recorded in EpicCare:  Patient Active Problem List   Diagnosis Date Noted   s/p Right Video Assisted Thoracoscopy with Lung Biopsy 01/12/2024   Interstitial lung disease (HCC) 01/11/2024   Preop cardiovascular exam 01/01/2024   Seasonal and perennial allergic rhinitis 12/25/2023   Cortical age-related cataract of both eyes 03/09/2023   Early dry stage nonexudative age-related macular degeneration of both eyes 03/09/2023   Myopia of both eyes with astigmatism and presbyopia 03/09/2023   Nuclear sclerotic cataract of both eyes 03/09/2023   ILD (interstitial lung disease) (HCC) 02/23/2023   Obstructive lung disease (HCC) 11/25/2022   Coronary artery calcification 11/25/2022   Dyspnea on exertion 08/02/2021   Environmental allergies 08/01/2021   Asthma 08/01/2021   Elevated LFTs 11/08/2019   Mild intermittent asthma without complication 11/08/2019   Pure hypercholesterolemia 11/08/2019    Current Outpatient Medications  Medication Sig Dispense Refill   acetaminophen  (TYLENOL ) 500 MG tablet Take 1-2 tablets (500-1,000 mg total) by mouth every 6 (six) hours as needed. 30 tablet 0   gabapentin  (NEURONTIN ) 300 MG capsule Take 1 capsule (300 mg total) by mouth at bedtime. 30 capsule 3   Spacer/Aero-Holding Chambers (AEROCHAMBER MV) inhaler Use as instructed (Patient taking differently: 1 each by Other route as directed. Use as instructed) 1 each 0   albuterol  (PROVENTIL ) (2.5 MG/3ML) 0.083% nebulizer solution Take 3 mLs (2.5 mg total) by nebulization every 2 (two) hours as needed for wheezing or shortness of breath. (Patient not taking: Reported on 05/26/2024) 150 mL 5   albuterol  (VENTOLIN  HFA) 108 (90 Base) MCG/ACT inhaler INHALE 2 PUFFS INTO THE LUNGS EVERY 4 HOURS AS NEEDED FOR WHEEZING OR SHORTNESS OF BREATH. (Patient not taking: Reported on 05/26/2024) 18 each 11   aspirin 81 MG chewable tablet Chew 81 mg by mouth in the morning.  (Patient not taking: Reported on 05/26/2024)     atorvastatin  (LIPITOR) 20 MG tablet Take 1 tablet (20 mg total) by mouth daily. (Patient not taking: Reported on 05/26/2024) 30 tablet 0   budeson-glycopyrrolate-formoterol  (BREZTRI  AEROSPHERE) 160-9-4.8 MCG/ACT AERO Inhale 2 puffs into the lungs in the morning and at bedtime. (Patient not taking: Reported on 05/26/2024) 10.7 g 3   levocetirizine (XYZAL ) 5 MG tablet Take 1 tablet (5 mg total) by mouth every evening. (Patient not taking: Reported on 05/26/2024) 30 tablet 11   montelukast  (SINGULAIR ) 10 MG tablet Take 1 tablet (10 mg total) by mouth at bedtime. (Patient not taking: Reported on 05/26/2024) 32 tablet 11   mupirocin  ointment (BACTROBAN ) 2 % Place 1 Application into the nose 2 (two) times daily for 4 days 22 g 0   Nintedanib (OFEV ) 150 MG CAPS Take 1 capsule (150 mg total) by mouth 2 (two) times daily. (Patient not taking: Reported on 05/26/2024) 180 capsule 1   omeprazole  (PRILOSEC) 20 MG capsule Take 1 capsule (20 mg total) by mouth daily. (Patient not taking: Reported on 05/26/2024) 90 capsule 3   triamcinolone  cream (KENALOG ) 0.1 % APPLY TO AFFECTED AREA TWICE A DAY (Patient not taking: Reported on 05/26/2024) 30 g 0   No current facility-administered medications for this visit.    Allergies: Patient has no known allergies.  Past Medical History:  Diagnosis Date   Asthma    COPD (chronic obstructive pulmonary disease) (HCC)    Coronary artery disease    Dyslipidemia    Dyspnea    with exertion   Environmental allergies  GERD (gastroesophageal reflux disease)    Hypertension    hx but no current meds per pt as of 01/07/24   ILD (interstitial lung disease) (HCC)    right   Macular degeneration     Past Surgical History:  Procedure Laterality Date   CHOLECYSTECTOMY     LUNG BIOPSY Right 01/11/2024   Procedure: LUNG BIOPSY;  Surgeon: Zelphia Higashi, MD;  Location: [facility] OR;  Service: Thoracic;  Laterality: Right;   MULTIPLE TOOTH  EXTRACTIONS     all upper with anesthesia   SPINE SURGERY     TONSILLECTOMY     VIDEO ASSISTED THORACOSCOPY Right 01/11/2024   Procedure: VIDEO ASSISTED THORACOSCOPY;  Surgeon: Zelphia Higashi, MD;  Location: [facility] OR;  Service: Thoracic;  Laterality: Right;    Family History  Problem Relation Age of Onset   Asthma Mother    Heart disease Father    Stroke Father     Social History   Tobacco Use   Smoking status: Never   Smokeless tobacco: Current    Types: Chew  Substance Use Topics   Alcohol use: Yes    Alcohol/week: 42.0 standard drinks of alcohol    Types: 42 Cans of beer per week    Comment: 6 pack a day,1 case per week per pt on 01/07/24    Subjective:   Requesting refill on Mucopiricin ointment; uses occasionally to help with nasal sores- notes that his nose "gets dry and then he will get a sore." No acute concerns today;  Has seen cardiology and pulmonology since last OV here;    Objective:  Vitals:   05/26/24 1326  BP: 138/86  Pulse: 93  SpO2: 95%  Weight: 244 lb 12.8 oz (111 kg)  Height: 6\' 2"  (1.88 m)    General: Well developed, well nourished, in no acute distress  Skin : Warm and dry.  Head: Normocephalic and atraumatic  Lungs: Respirations unlabored; clear to auscultation bilaterally without wheeze, rales, rhonchi  CVS exam: normal rate and regular rhythm.  Neurologic: Alert and oriented; speech intact; face symmetrical; moves all extremities well; CNII-XII intact without focal deficit   Assessment:  1. Nasal sore   2. Interstitial lung disease (HCC)     Plan:  Rx for Mucopiricin to use as needed for recurrent nasal sores; no acute symptoms today; Keep planned follow up with pulmonology for later this summer.   Return in about 6 months (around 11/25/2024) for CPE.  No orders of the defined types were placed in this encounter.   Requested Prescriptions   Signed Prescriptions Disp Refills   mupirocin  ointment (BACTROBAN ) 2 % 22 g 0    Sig:  Place 1 Application into the nose 2 (two) times daily for 4 days

## 2024-06-04 ENCOUNTER — Other Ambulatory Visit: Payer: Self-pay | Admitting: Family

## 2024-06-21 ENCOUNTER — Ambulatory Visit: Payer: Self-pay

## 2024-06-21 NOTE — Telephone Encounter (Signed)
 Copied from CRM [id number]. Topic: Clinical - Red Word Triage >> Jun 21, 2024  3:36 PM Nathanel DEL wrote: Red Word that prompted transfer to Nurse Triage: pt says chronic cough,  throat is raw..    FYI Only or Action Required?: Action required by provider: Patient requesting medication for his cough until his upcomming appointment.  Patient is followed in Pulmonology for Asthma, last seen on 02/25/2024 by Malachy Comer GAILS, NP. Called Nurse Triage reporting Cough. Symptoms began several weeks ago.Symptoms are: gradually worsening.  Triage Disposition: See PCP When Office is Open (Within 3 Days)  Patient/caregiver understands and will follow disposition?: Yes with modifications     Reason for Disposition  History of asthma or has mild wheezing  Answer Assessment - Initial Assessment Questions Appointment made for 7/28 (earliest available) and added to wait list. Patient would like to have something called in to his pharmacy for his cough to help until his appointment. Please advise.        1. ONSET: When did the cough begin?      2.5 weeks ago  2. SEVERITY: How bad is the cough today?      Moderate to severe  3. SPUTUM: Describe the color of your sputum (none, dry cough; clear, white, yellow, green)     White  4. HEMOPTYSIS: Are you coughing up any blood? If so ask: How much? (flecks, streaks, tablespoons, etc.)     No 5. DIFFICULTY BREATHING: Are you having difficulty breathing? If Yes, ask: How bad is it? (e.g., mild, moderate, severe)    - MILD: No SOB at rest, mild SOB with walking, speaks normally in sentences, can lie down, no retractions, pulse < 100.    - MODERATE: SOB at rest, SOB with minimal exertion and prefers to sit, cannot lie down flat, speaks in phrases, mild retractions, audible wheezing, pulse 100-120.    - SEVERE: Very SOB at rest, speaks in single words, struggling to breathe, sitting hunched forward, retractions, pulse > 120      Mild 6. FEVER: Do  you have a fever? If Yes, ask: What is your temperature, how was it measured, and when did it start?     No 7. CARDIAC HISTORY: Do you have any history of heart disease? (e.g., heart attack, congestive heart failure)      Yes 8. LUNG HISTORY: Do you have any history of lung disease?  (e.g., pulmonary embolus, asthma, emphysema)     Yes 9. PE RISK FACTORS: Do you have a history of blood clots? (or: recent major surgery, recent prolonged travel, bedridden)     No 10. OTHER SYMPTOMS: Do you have any other symptoms? (e.g., runny nose, wheezing, chest pain)       Sore throat, difficulty sleeping  Protocols used: Cough - Chronic-A-AH

## 2024-06-21 NOTE — Telephone Encounter (Signed)
 Patient called back for an update on his medication request. I advised patient of the home care instructions below. Patient states he is unhappy with that solution and stated he will call back again tomorrow to try again.      Noted, NFN, tried to call patient but call will not go through at this time. Providers have recommended salt water gargles 2-3 times a day and to take motrin as needed for pain if tolerated until he sees his PCP or Izetta Rouleau, NP.

## 2024-06-21 NOTE — Telephone Encounter (Signed)
 Noted, NFN, tried to call patient but call will not go through at this time. Providers have recommended salt water gargles 2-3 times a day and to take motrin as needed for pain if tolerated until he sees his PCP or Izetta Rouleau, NP.

## 2024-07-18 ENCOUNTER — Ambulatory Visit: Admitting: Nurse Practitioner

## 2024-07-19 ENCOUNTER — Encounter: Payer: Self-pay | Admitting: Nurse Practitioner

## 2024-07-20 ENCOUNTER — Ambulatory Visit: Admitting: Pulmonary Disease

## 2024-07-28 ENCOUNTER — Other Ambulatory Visit: Payer: Self-pay | Admitting: Nurse Practitioner

## 2024-07-28 DIAGNOSIS — J849 Interstitial pulmonary disease, unspecified: Secondary | ICD-10-CM

## 2024-07-29 ENCOUNTER — Other Ambulatory Visit: Payer: Self-pay | Admitting: Family

## 2024-08-18 ENCOUNTER — Ambulatory Visit: Admitting: Nurse Practitioner

## 2024-08-22 ENCOUNTER — Other Ambulatory Visit: Payer: Self-pay | Admitting: Physician Assistant

## 2024-08-22 ENCOUNTER — Other Ambulatory Visit: Payer: Self-pay | Admitting: Family

## 2024-09-10 ENCOUNTER — Other Ambulatory Visit: Payer: Self-pay | Admitting: Family

## 2024-09-20 ENCOUNTER — Other Ambulatory Visit: Payer: Self-pay | Admitting: Family

## 2024-09-20 ENCOUNTER — Telehealth: Payer: Self-pay | Admitting: Family

## 2024-09-20 NOTE — Telephone Encounter (Signed)
 Copied from CRM [id number]. Topic: Medicare AWV >> Sep 20, 2024  1:20 PM Nathanel DEL wrote: Reason for CRM: Called LVM 09/20/24 to change AWV appt on 10/25/24 from in person to telephone due to [facility] working remote. New AWV appt 10/27/24 at 1pm. Please call to confirm date change.  Nathanel Paschal; Care Guide Ambulatory Clinical Support [place] [facility] Direct Dial: [phone]

## 2024-10-25 ENCOUNTER — Telehealth: Payer: Self-pay | Admitting: Family

## 2024-10-25 ENCOUNTER — Ambulatory Visit: Payer: Self-pay | Admitting: Family

## 2024-10-25 MED ORDER — MUPIROCIN 2 % EX OINT
1.0000 | TOPICAL_OINTMENT | Freq: Two times a day (BID) | CUTANEOUS | 0 refills | Status: AC
Start: 2024-10-25 — End: ?

## 2024-10-25 NOTE — Telephone Encounter (Signed)
 Pt has been scheduled with Harlene Jolly, NP on 11/11 at 2:20

## 2024-10-25 NOTE — Telephone Encounter (Signed)
 Patient/caller partially completed triage.  Reason for refusal: upset pcp left and no TOC appointments until 2026, upset cannot be seen acutely until toc.   FYI Only or Action Required?: Action required by provider: request for appointment and medication refill request.  Patient was last seen in primary care on 05/26/2024 by Jason Leita Repine, FNP.  Called Nurse Triage reporting Medication Refill and Transitions Of Care.  Symptoms began flair, refused full triage for chronic condition.  Interventions attempted: Other: called for refill.  Symptoms are: declined to answer.  Triage Disposition: Call PCP When Office is Open  Patient/caregiver understands and will follow disposition?: No, refuses disposition    Reason for Disposition  [1] Prescription refill request for NON-ESSENTIAL medicine (i.e., no harm to patient if med not taken) AND [2] triager unable to refill per department policy  Answer Assessment - Initial Assessment Questions Additional info: Patient is very frustrated that PCP has left and will need a TOC. He states he was not aware pcp has left the clinic. Advised patient letter was sent via MyChart in August but patient states I have problems with my chart. Next available provider for [facility] is not until February 2026. Patient declined this visit, wants appointment asap for [facility], what are you telling me I can't get sick or have refill because my provider decided to quit. Apologized to patient that he missed communication that pcp left and again offered to schedule TOC, patient declines and wants clinic staff to reassign a pcp, call him with sooner appointment, and refill mupirocin  for chronic condition managed by in pcp clinic.     1. DRUG NAME: What medicine do you need to have refilled?     Mupirocin   2. REFILLS REMAINING: How many refills are remaining? Notes: The label on the medicine or pill bottle will show how many refills are remaining. If there are no  refills remaining, then a renewal may be needed.     none 3. EXPIRATION DATE: What is the expiration date? Note: The label states when the prescription will expire, and thus can no longer be refilled.)      4. PRESCRIBER: Who prescribed it? Note: The prescribing doctor or group is responsible for refill approvals.SABRA     PCP-Laura Jason Repine 5. PHARMACY: Have you contacted your pharmacy (drugstore)? Note: Some pharmacies will contact the doctor (or NP/PA).       6. SYMPTOMS: Do you have any symptoms?     Dry patch inner nostril. Same flair as per ususal.  Protocols used: Medication Refill and Renewal Call-A-AH             Copied from patient calls folder.  Copied from CRM [id number]. Topic: Clinical - Medication Refill >> Oct 25, 2024 11:29 AM Viola F wrote: Medication: mupirocin  ointment (BACTROBAN ) 2 % [[id number]]   Has the patient contacted their pharmacy? Yes (Agent: If no, request that the patient contact the pharmacy for the refill. If patient does not wish to contact the pharmacy document the reason why and proceed with request.) (Agent: If yes, when and what did the pharmacy advise?)   This is the patient's preferred pharmacy:    [id number] [place] - [address] [place] [address] Phone: [phone] Fax: [phone]

## 2024-10-25 NOTE — Telephone Encounter (Signed)
 Unable to convert to nurse triage encounter from patient call folder. See nurse triage encounter 10/25/24. Signing to close chart.

## 2024-10-25 NOTE — Telephone Encounter (Signed)
 Copied from CRM [id number]. Topic: Clinical - Medication Refill >> Oct 25, 2024 11:29 AM Viola F wrote: Medication: mupirocin  ointment (BACTROBAN ) 2 % [[id number]]  Has the patient contacted their pharmacy? Yes (Agent: If no, request that the patient contact the pharmacy for the refill. If patient does not wish to contact the pharmacy document the reason why and proceed with request.) (Agent: If yes, when and what did the pharmacy advise?)  This is the patient's preferred pharmacy:   [id number] [place], [place] - [address] [place] [address] Phone: [phone] Fax: [phone]  Is this the correct pharmacy for this prescription? Yes If no, delete pharmacy and type the correct one.   Has the prescription been filled recently? Yes  Is the patient out of the medication? Yes  Has the patient been seen for an appointment in the last year OR does the patient have an upcoming appointment? Yes  Can we respond through MyChart? Yes  Agent: Please be advised that Rx refills may take up to 3 business days. We ask that you follow-up with your pharmacy.

## 2024-10-26 ENCOUNTER — Telehealth: Payer: Self-pay | Admitting: Family

## 2024-10-26 NOTE — Telephone Encounter (Signed)
 Patient returned call requesting refill of medication bactroban  ointment. See previous NT encounter from 10/25/24. Patient needs TOC appt and to see new provider due to previous provider not at practice. Patient reports he has appt scheduled 11/01/24. Recommended patient go to pharmacy and request which OTC medication may be recommended until patient evaluated by new provider.  Patient reports he has same dry and crusty skin around nose.  Patient was upset by protocol recommended and hung up phone.      Copied from CRM #[id number]. Topic: Clinical - Red Word Triage >> Oct 26, 2024  8:19 AM Aleatha BROCKS wrote: Red Word that prompted transfer to Nurse Triage: Patient said he spoke to a nurse stating that his  mupirocin  ointment (BACTROBAN ) 2 %   [facility] and [facility] at [facility] and its not there

## 2024-10-27 ENCOUNTER — Ambulatory Visit: Admitting: *Deleted

## 2024-10-27 VITALS — Ht 74.0 in | Wt 235.0 lb

## 2024-10-27 DIAGNOSIS — Z0000 Encounter for general adult medical examination without abnormal findings: Secondary | ICD-10-CM

## 2024-10-27 NOTE — Progress Notes (Signed)
 Please attest this visit in the absence of patient primary care provider.    Subjective:   Mark Doyle is a 68 y.o. male who presents for a Medicare Annual Wellness Visit.  Allergies (verified) Patient has no known allergies.   History: Past Medical History:  Diagnosis Date   Asthma    COPD (chronic obstructive pulmonary disease) (HCC)    Coronary artery disease    Dyslipidemia    Dyspnea    with exertion   Environmental allergies    GERD (gastroesophageal reflux disease)    Hypertension    hx but no current meds per pt as of 01/07/24   ILD (interstitial lung disease) (HCC)    right   Macular degeneration    Past Surgical History:  Procedure Laterality Date   CHOLECYSTECTOMY     LUNG BIOPSY Right 01/11/2024   Procedure: LUNG BIOPSY;  Surgeon: Kerrin Elspeth BROCKS, MD;  Location: [facility] OR;  Service: Thoracic;  Laterality: Right;   MULTIPLE TOOTH EXTRACTIONS     all upper with anesthesia   SPINE SURGERY     TONSILLECTOMY     VIDEO ASSISTED THORACOSCOPY Right 01/11/2024   Procedure: VIDEO ASSISTED THORACOSCOPY;  Surgeon: Kerrin Elspeth BROCKS, MD;  Location: [facility] OR;  Service: Thoracic;  Laterality: Right;   Family History  Problem Relation Age of Onset   Asthma Mother    Heart disease Father    Stroke Father    Social History   Occupational History   Not on file  Tobacco Use   Smoking status: Never   Smokeless tobacco: Current    Types: Chew  Vaping Use   Vaping status: Never Used  Substance and Sexual Activity   Alcohol use: Yes    Alcohol/week: 42.0 standard drinks of alcohol    Types: 42 Cans of beer per week    Comment: 6 pack a day,1 case per week per pt on 01/07/24   Drug use: Never   Sexual activity: Not Currently   Tobacco Counseling Ready to quit: Not Answered Counseling given: Not Answered  SDOH Screenings   Food Insecurity: No Food Insecurity (01/15/2024)  Housing: High Risk (01/15/2024)  Transportation Needs: No Transportation Needs  (01/15/2024)  Utilities: At Risk (01/15/2024)  Alcohol Screen: Low Risk  (10/20/2023)  Depression (PHQ2-9): Low Risk  (10/27/2024)  Financial Resource Strain: Low Risk  (10/20/2023)  Physical Activity: Insufficiently Active (10/27/2024)  Social Connections: Moderately Isolated (10/27/2024)  Stress: Stress Concern Present (10/27/2024)  Tobacco Use: High Risk (10/27/2024)  Health Literacy: Adequate Health Literacy (10/20/2023)   Depression Screen    10/27/2024    1:18 PM 10/20/2023    3:07 PM 10/17/2022    9:29 AM 10/15/2022    2:24 PM 09/23/2021   10:37 AM 07/09/2021   10:43 AM  PHQ 2/9 Scores  PHQ - 2 Score 0 0 0 0 0 0  PHQ- 9 Score 3          Goals Addressed   None    Visit info / Clinical Intake: Medicare Wellness Visit Type:: Subsequent Annual Wellness Visit Medicare Wellness Visit Mode:: Telephone If telephone:: video declined If telephone or video:: pt reported vitals Interpreter Needed?: No Pre-visit prep was completed: yes AWV questionnaire completed by patient prior to visit?: no Living arrangements:: lives with spouse/significant other Patient's Overall Health Status Rating: (!) fair (gets very short of breath when walks. good when sitting) Typical amount of pain: none Does pain affect daily life?: no Are you currently prescribed opioids?: no  Dietary Habits and Nutritional Risks How many meals a day?: 2 (eats snacks between) Eats fruit and vegetables daily?: yes (sometimes eats bananna on sandwich) In the last 2 weeks, have you had any of the following?: (!) nausea, vomiting, diarrhea (reports intermittent constipation then very loose stools) Diabetic:: no  Functional Status Activities of Daily Living (to include ambulation/medication): Independent Ambulation: Independent Medication Administration: Independent Home Management: Independent Manage your own finances?: yes Primary transportation is: driving Concerns about vision?: (!) yes (Up to date with [facility]. Sees specialist for macular degeneration but can't think of name) Concerns about hearing?: no  Fall Screening Falls in the past year?: 0 Number of falls in past year: 0 Was there an injury with Fall?: 0 Fall Risk Category Calculator: 0 Patient Fall Risk Level: Low Fall Risk  Fall Risk Patient at Risk for Falls Due to: Impaired mobility; Impaired vision Fall risk Follow up: Falls evaluation completed  Home and Transportation Safety: All rugs have non-skid backing?: yes Grab bars in the bathtub or shower?: (!) no Have non-skid surface in bathtub or shower?: yes Good home lighting?: yes Regular seat belt use?: yes Hospital stays in the last year:: no  Cognitive Assessment Difficulty concentrating, remembering, or making decisions? : no Will 6CIT or Mini Cog be Completed: yes What year is it?: 0 points What month is it?: 0 points Give patient an address phrase to remember (5 components): [address], [place]  About what time is it?: 0 points Count backwards from 20 to 1: 0 points Say the months of the year in reverse: 2 points Repeat the address phrase from earlier: 0 points 6 CIT Score: 2 points  Advance Directives (For Healthcare) Does Patient Have a Medical Advance Directive?: No Would patient like information on creating a medical advance directive?: No - Patient declined  Reviewed/Updated  Reviewed/Updated: All        Objective:    Today's Vitals   10/27/24 1303  Weight: 235 lb (106.6 kg)  Height: 6' 2 (1.88 m)   Body mass index is 30.17 kg/m.  Current Medications (verified) Outpatient Encounter Medications as of 10/27/2024  Medication Sig   acetaminophen  (TYLENOL ) 500 MG tablet Take 1-2 tablets (500-1,000 mg total) by mouth every 6 (six) hours as needed.   albuterol  (PROVENTIL ) (2.5 MG/3ML) 0.083% nebulizer solution Take 3 mLs (2.5 mg total) by nebulization every 2 (two) hours as needed for wheezing or shortness of breath.   albuterol   (VENTOLIN  HFA) 108 (90 Base) MCG/ACT inhaler INHALE 2 PUFFS INTO THE LUNGS EVERY 4 HOURS AS NEEDED FOR WHEEZING OR SHORTNESS OF BREATH.   aspirin 81 MG chewable tablet Chew 81 mg by mouth in the morning.   atorvastatin  (LIPITOR) 20 MG tablet TAKE 1 TABLET(20 MG) BY MOUTH DAILY   BREZTRI  AEROSPHERE 160-9-4.8 MCG/ACT AERO inhaler INHALE 2 PUFFS INTO THE LUNGS IN THE MORNING AND AT BEDTIME.   levocetirizine (XYZAL ) 5 MG tablet Take 1 tablet (5 mg total) by mouth every evening.   montelukast  (SINGULAIR ) 10 MG tablet Take 1 tablet (10 mg total) by mouth at bedtime.   mupirocin  ointment (BACTROBAN ) 2 % Place 1 Application into the nose 2 (two) times daily.   omeprazole  (PRILOSEC) 20 MG capsule Take 1 capsule (20 mg total) by mouth daily.   triamcinolone  cream (KENALOG ) 0.1 % APPLY TO AFFECTED AREA TWICE A DAY   gabapentin  (NEURONTIN ) 300 MG capsule Take 1 capsule (300 mg total) by mouth at bedtime. (Patient not taking: Reported on 10/27/2024)  Nintedanib (OFEV ) 150 MG CAPS Take 1 capsule (150 mg total) by mouth 2 (two) times daily. (Patient not taking: Reported on 10/27/2024)   Spacer/Aero-Holding Chambers (AEROCHAMBER MV) inhaler Use as instructed (Patient taking differently: 1 each by Other route as directed. Use as instructed)   No facility-administered encounter medications on file as of 10/27/2024.   Hearing/Vision screen No results found. Immunizations and Health Maintenance Health Maintenance  Topic Date Due   Hepatitis C Screening  Never done   DTaP/Tdap/Td (1 - Tdap) Never done   Pneumococcal Vaccine: 50+ Years (1 of 2 - PCV) 02/24/2025 (Originally 08/07/1975)   Influenza Vaccine  03/21/2025 (Originally 07/22/2024)   COVID-19 Vaccine (1 - 2025-26 season) 10/27/2025 (Originally 08/22/2024)   Medicare Annual Wellness (AWV)  10/27/2025   Fecal DNA (Cologuard)  11/07/2026   Meningococcal B Vaccine  Aged Out   Zoster Vaccines- Shingrix  Discontinued        Assessment/Plan:  This is a routine  wellness examination for [place].  Patient Care Team: Jason Leita Repine, FNP (Inactive) as PCP - General (Internal Medicine) [place], [facility]  I have personally reviewed and noted the following in the patient's chart:   Medical and social history Use of alcohol, tobacco or illicit drugs  Current medications and supplements including opioid prescriptions. Functional ability and status Nutritional status Physical activity Advanced directives List of other physicians Hospitalizations, surgeries, and ER visits in previous 12 months Vitals Screenings to include cognitive, depression, and falls Referrals and appointments  No orders of the defined types were placed in this encounter.  In addition, I have reviewed and discussed with patient certain preventive protocols, quality metrics, and best practice recommendations. A written personalized care plan for preventive services as well as general preventive health recommendations were provided to patient.   Lolita Libra, CMA   10/27/2024   Return in 1 year (on 10/27/2025).  After Visit Summary: (Mail) Due to this being a telephonic visit, the after visit summary with patients personalized plan was offered to patient via mail   Nurse Notes: nothing significant to report

## 2024-10-27 NOTE — Patient Instructions (Addendum)
 [name],  Thank you for taking the time for your Medicare Wellness Visit. I appreciate your continued commitment to your health goals. Please review the care plan we discussed, and feel free to reach out if I can assist you further.  Please note that Annual Wellness Visits do not include a physical exam. Some assessments may be limited, especially if the visit was conducted virtually. If needed, we may recommend an in-person follow-up with your provider.  Ongoing Care Seeing your primary care provider every 3 to 6 months helps us  monitor your health and provide consistent, personalized care.   Mark Jolly, NP: 11/01/24 2:20pm Annual Wellness Visit: 11/01/25 1pm, telephone  Referrals If a referral was made during today's visit and you haven't received any updates within two weeks, please contact the referred provider directly to check on the status.  Please schedule a follow up with [facility], Dr Janit: [phone]  Recommended Screenings:  You will need to get the following vaccines at your local pharmacy: tetanus   Health Maintenance  Topic Date Due   Hepatitis C Screening  Never done   DTaP/Tdap/Td vaccine (1 - Tdap) Never done   Flu Shot  Never done   COVID-19 Vaccine (1 - 2025-26 season) Never done   Medicare Annual Wellness Visit  10/19/2024   Pneumococcal Vaccine for age over 79 (1 of 2 - PCV) 02/24/2025*   Cologuard (Stool DNA test)  11/07/2026   Meningitis B Vaccine  Aged Out   Zoster (Shingles) Vaccine  Discontinued  *Topic was postponed. The date shown is not the original due date.       01/11/2024    4:00 PM  Advanced Directives  Does Patient Have a Medical Advance Directive? No  Would patient like information on creating a medical advance directive? No - Patient declined    Vision: Annual vision screenings are recommended for early detection of glaucoma, cataracts, and diabetic retinopathy. These exams can also reveal signs of chronic  conditions such as diabetes and high blood pressure.  Dental: Annual dental screenings help detect early signs of oral cancer, gum disease, and other conditions linked to overall health, including heart disease and diabetes.  Please see the attached documents for additional preventive care recommendations.

## 2024-10-28 NOTE — Progress Notes (Signed)
 Please attest this visit in the absence of patient primary care provider.   I connected with  Mark Doyle on 10/28/24 by a audio enabled telemedicine application and verified that I am speaking with the correct person using two identifiers.  Patient Location: Home  Provider Location: Office/Clinic  Persons Participating in Visit: Patient.  I discussed the limitations of evaluation and management by telemedicine. The patient expressed understanding and agreed to proceed.  Vital Signs: Because this visit was a virtual/telehealth visit, some criteria may be missing or patient reported. Any vitals not documented were not able to be obtained and vitals that have been documented are patient reported.   Subjective:   Mark Doyle is a 68 y.o. male who presents for a Medicare Annual Wellness Visit.  Allergies (verified) Patient has no known allergies.   History: Past Medical History:  Diagnosis Date   Asthma    COPD (chronic obstructive pulmonary disease) (HCC)    Coronary artery disease    Dyslipidemia    Dyspnea    with exertion   Environmental allergies    GERD (gastroesophageal reflux disease)    Hypertension    hx but no current meds per pt as of 01/07/24   ILD (interstitial lung disease) (HCC)    right   Macular degeneration    Past Surgical History:  Procedure Laterality Date   CHOLECYSTECTOMY     LUNG BIOPSY Right 01/11/2024   Procedure: LUNG BIOPSY;  Surgeon: Kerrin Elspeth BROCKS, MD;  Location: [facility] OR;  Service: Thoracic;  Laterality: Right;   MULTIPLE TOOTH EXTRACTIONS     all upper with anesthesia   SPINE SURGERY     TONSILLECTOMY     VIDEO ASSISTED THORACOSCOPY Right 01/11/2024   Procedure: VIDEO ASSISTED THORACOSCOPY;  Surgeon: Kerrin Elspeth BROCKS, MD;  Location: [facility] OR;  Service: Thoracic;  Laterality: Right;   Family History  Problem Relation Age of Onset   Asthma Mother    Heart disease Father    Stroke Father    Social History   Occupational  History   Not on file  Tobacco Use   Smoking status: Never   Smokeless tobacco: Current    Types: Chew  Vaping Use   Vaping status: Never Used  Substance and Sexual Activity   Alcohol use: Yes    Alcohol/week: 42.0 standard drinks of alcohol    Types: 42 Cans of beer per week    Comment: 6 pack a day,1 case per week per pt on 01/07/24   Drug use: Never   Sexual activity: Not Currently   Tobacco Counseling Ready to quit: Not Answered Counseling given: Not Answered  SDOH Screenings   Food Insecurity: No Food Insecurity (01/15/2024)  Housing: High Risk (01/15/2024)  Transportation Needs: No Transportation Needs (01/15/2024)  Utilities: At Risk (01/15/2024)  Alcohol Screen: Low Risk  (10/20/2023)  Depression (PHQ2-9): Low Risk  (10/27/2024)  Financial Resource Strain: Low Risk  (10/20/2023)  Physical Activity: Insufficiently Active (10/27/2024)  Social Connections: Moderately Isolated (10/27/2024)  Stress: Stress Concern Present (10/27/2024)  Tobacco Use: High Risk (10/27/2024)  Health Literacy: Adequate Health Literacy (10/20/2023)   Depression Screen    10/27/2024    1:18 PM 10/20/2023    3:07 PM 10/17/2022    9:29 AM 10/15/2022    2:24 PM 09/23/2021   10:37 AM 07/09/2021   10:43 AM  PHQ 2/9 Scores  PHQ - 2 Score 0 0 0 0 0 0  PHQ- 9 Score 3  Goals Addressed   None    Visit info / Clinical Intake: Medicare Wellness Visit Type:: Subsequent Annual Wellness Visit Medicare Wellness Visit Mode:: Telephone If telephone:: video declined If telephone or video:: pt reported vitals Interpreter Needed?: No Pre-visit prep was completed: yes AWV questionnaire completed by patient prior to visit?: no Living arrangements:: lives with spouse/significant other Patient's Overall Health Status Rating: (!) fair (gets very short of breath when walks. good when sitting) Typical amount of pain: none Does pain affect daily life?: no Are you currently prescribed opioids?: no  Dietary  Habits and Nutritional Risks How many meals a day?: 2 (eats snacks between) Eats fruit and vegetables daily?: yes (sometimes eats bananna on sandwich) In the last 2 weeks, have you had any of the following?: (!) nausea, vomiting, diarrhea (reports intermittent constipation then very loose stools) Diabetic:: no  Functional Status Activities of Daily Living (to include ambulation/medication): Independent Ambulation: Independent Medication Administration: Independent Home Management: Independent Manage your own finances?: yes Primary transportation is: driving Concerns about vision?: (!) yes (Up to date with [facility]. Sees specialist for macular degeneration but can't think of name) Concerns about hearing?: no  Fall Screening Falls in the past year?: 0 Number of falls in past year: 0 Was there an injury with Fall?: 0 Fall Risk Category Calculator: 0 Patient Fall Risk Level: Low Fall Risk  Fall Risk Patient at Risk for Falls Due to: Impaired mobility; Impaired vision Fall risk Follow up: Falls evaluation completed  Home and Transportation Safety: All rugs have non-skid backing?: yes Grab bars in the bathtub or shower?: (!) no Have non-skid surface in bathtub or shower?: yes Good home lighting?: yes Regular seat belt use?: yes Hospital stays in the last year:: no  Cognitive Assessment Difficulty concentrating, remembering, or making decisions? : no Will 6CIT or Mini Cog be Completed: yes What year is it?: 0 points What month is it?: 0 points Give patient an address phrase to remember (5 components): [address], [place]  About what time is it?: 0 points Count backwards from 20 to 1: 0 points Say the months of the year in reverse: 2 points Repeat the address phrase from earlier: 0 points 6 CIT Score: 2 points  Advance Directives (For Healthcare) Does Patient Have a Medical Advance Directive?: No Would patient like information on creating a medical advance  directive?: No - Patient declined  Reviewed/Updated  Reviewed/Updated: All        Objective:    Today's Vitals   10/27/24 1303  Weight: 235 lb (106.6 kg)  Height: 6' 2 (1.88 m)   Body mass index is 30.17 kg/m.  Current Medications (verified) Outpatient Encounter Medications as of 10/27/2024  Medication Sig   acetaminophen  (TYLENOL ) 500 MG tablet Take 1-2 tablets (500-1,000 mg total) by mouth every 6 (six) hours as needed.   albuterol  (PROVENTIL ) (2.5 MG/3ML) 0.083% nebulizer solution Take 3 mLs (2.5 mg total) by nebulization every 2 (two) hours as needed for wheezing or shortness of breath.   albuterol  (VENTOLIN  HFA) 108 (90 Base) MCG/ACT inhaler INHALE 2 PUFFS INTO THE LUNGS EVERY 4 HOURS AS NEEDED FOR WHEEZING OR SHORTNESS OF BREATH.   aspirin 81 MG chewable tablet Chew 81 mg by mouth in the morning.   atorvastatin  (LIPITOR) 20 MG tablet TAKE 1 TABLET(20 MG) BY MOUTH DAILY   BREZTRI  AEROSPHERE 160-9-4.8 MCG/ACT AERO inhaler INHALE 2 PUFFS INTO THE LUNGS IN THE MORNING AND AT BEDTIME.   levocetirizine (XYZAL ) 5 MG tablet Take 1  tablet (5 mg total) by mouth every evening.   montelukast  (SINGULAIR ) 10 MG tablet Take 1 tablet (10 mg total) by mouth at bedtime.   mupirocin  ointment (BACTROBAN ) 2 % Place 1 Application into the nose 2 (two) times daily.   omeprazole  (PRILOSEC) 20 MG capsule Take 1 capsule (20 mg total) by mouth daily.   triamcinolone  cream (KENALOG ) 0.1 % APPLY TO AFFECTED AREA TWICE A DAY   gabapentin  (NEURONTIN ) 300 MG capsule Take 1 capsule (300 mg total) by mouth at bedtime. (Patient not taking: Reported on 10/27/2024)   Nintedanib (OFEV ) 150 MG CAPS Take 1 capsule (150 mg total) by mouth 2 (two) times daily. (Patient not taking: Reported on 10/27/2024)   Spacer/Aero-Holding Chambers (AEROCHAMBER MV) inhaler Use as instructed (Patient taking differently: 1 each by Other route as directed. Use as instructed)   No facility-administered encounter medications on file as  of 10/27/2024.   Hearing/Vision screen No results found. Immunizations and Health Maintenance Health Maintenance  Topic Date Due   Hepatitis C Screening  Never done   DTaP/Tdap/Td (1 - Tdap) Never done   Pneumococcal Vaccine: 50+ Years (1 of 2 - PCV) 02/24/2025 (Originally 08/07/1975)   Influenza Vaccine  03/21/2025 (Originally 07/22/2024)   COVID-19 Vaccine (1 - 2025-26 season) 10/27/2025 (Originally 08/22/2024)   Medicare Annual Wellness (AWV)  10/27/2025   Fecal DNA (Cologuard)  11/07/2026   Meningococcal B Vaccine  Aged Out   Zoster Vaccines- Shingrix  Discontinued        Assessment/Plan:  This is a routine wellness examination for [place].  Patient Care Team: Jason Leita Repine, FNP (Inactive) as PCP - General (Internal Medicine) [place], [facility]  I have personally reviewed and noted the following in the patient's chart:   Medical and social history Use of alcohol, tobacco or illicit drugs  Current medications and supplements including opioid prescriptions. Functional ability and status Nutritional status Physical activity Advanced directives List of other physicians Hospitalizations, surgeries, and ER visits in previous 12 months Vitals Screenings to include cognitive, depression, and falls Referrals and appointments  No orders of the defined types were placed in this encounter.  In addition, I have reviewed and discussed with patient certain preventive protocols, quality metrics, and best practice recommendations. A written personalized care plan for preventive services as well as general preventive health recommendations were provided to patient.   Lolita Libra, CMA   10/28/2024   Return in 1 year (on 10/27/2025).  After Visit Summary: (Mail) Due to this being a telephonic visit, the after visit summary with patients personalized plan was offered to patient via mail   Nurse Notes: nothing significant to report

## 2024-10-28 NOTE — Progress Notes (Unsigned)
 Subjective:     Patient ID: Mark Doyle, male    DOB: 05/16/1956, 68 y.o.   MRN: [id number]  No chief complaint on file.   HPI  Discussed the use of AI scribe software for clinical note transcription with the patient, who gave verbal consent to proceed.  Follows with: Pulmonology, cardiology, allergy  Bactroban  ointment?  Married/ Kids/ Working?  Specialists?  COPD and interstitial lung disease-follows with pulmonology Breztri  daily and albuterol  as needed Hx of DOE, VATS procedure-12/2023  HLD-atorvastatin  20 mg daily  GERD- Omeprazole  20 mg daily  Allergies-Singulair , Xyzal   Patient denies fever, chills, SOB, CP, palpitations, dyspnea, edema, HA, vision changes, N/V/D, abdominal pain, urinary symptoms, rash, weight changes, and recent illness or hospitalizations.     History of Present Illness              Health Maintenance Due  Topic Date Due   Hepatitis C Screening  Never done   DTaP/Tdap/Td (1 - Tdap) Never done    Past Medical History:  Diagnosis Date   Asthma    COPD (chronic obstructive pulmonary disease) (HCC)    Coronary artery disease    Dyslipidemia    Dyspnea    with exertion   Environmental allergies    GERD (gastroesophageal reflux disease)    Hypertension    hx but no current meds per pt as of 01/07/24   ILD (interstitial lung disease) (HCC)    right   Macular degeneration     Past Surgical History:  Procedure Laterality Date   CHOLECYSTECTOMY     LUNG BIOPSY Right 01/11/2024   Procedure: LUNG BIOPSY;  Surgeon: Kerrin Elspeth BROCKS, MD;  Location: [facility] OR;  Service: Thoracic;  Laterality: Right;   MULTIPLE TOOTH EXTRACTIONS     all upper with anesthesia   SPINE SURGERY     TONSILLECTOMY     VIDEO ASSISTED THORACOSCOPY Right 01/11/2024   Procedure: VIDEO ASSISTED THORACOSCOPY;  Surgeon: Kerrin Elspeth BROCKS, MD;  Location: [facility] OR;  Service: Thoracic;  Laterality: Right;    Family History  Problem Relation Age of Onset    Asthma Mother    Heart disease Father    Stroke Father     Social History   Socioeconomic History   Marital status: Married    Spouse name: Not on file   Number of children: Not on file   Years of education: Not on file   Highest education level: Not on file  Occupational History   Not on file  Tobacco Use   Smoking status: Never   Smokeless tobacco: Current    Types: Chew  Vaping Use   Vaping status: Never Used  Substance and Sexual Activity   Alcohol use: Yes    Alcohol/week: 42.0 standard drinks of alcohol    Types: 42 Cans of beer per week    Comment: 6 pack a day,1 case per week per pt on 01/07/24   Drug use: Never   Sexual activity: Not Currently  Other Topics Concern   Not on file  Social History Narrative   Not on file   Social Drivers of Health   Financial Resource Strain: Low Risk  (10/20/2023)   Overall Financial Resource Strain (CARDIA)    Difficulty of Paying Living Expenses: Not hard at all  Food Insecurity: No Food Insecurity (01/15/2024)   Hunger Vital Sign    Worried About Running Out of Food in the Last Year: Never true    Ran Out of Food in  the Last Year: Never true  Transportation Needs: No Transportation Needs (01/15/2024)   PRAPARE - Administrator, Civil Service (Medical): No    Lack of Transportation (Non-Medical): No  Physical Activity: Insufficiently Active (10/27/2024)   Exercise Vital Sign    Days of Exercise per Week: 7 days    Minutes of Exercise per Session: 10 min  Stress: Stress Concern Present (10/27/2024)   [employer] of Occupational Health - Occupational Stress Questionnaire    Feeling of Stress: To some extent  Social Connections: Moderately Isolated (10/27/2024)   Social Connection and Isolation Panel    Frequency of Communication with Friends and Family: More than three times a week    Frequency of Social Gatherings with Friends and Family: More than three times a week    Attends Religious Services: Never     Database Administrator or Organizations: No    Attends Banker Meetings: Never    Marital Status: Married  Catering Manager Violence: Not At Risk (01/15/2024)   Humiliation, Afraid, Rape, and Kick questionnaire    Fear of Current or Ex-Partner: No    Emotionally Abused: No    Physically Abused: No    Sexually Abused: No    Outpatient Medications Prior to Visit  Medication Sig Dispense Refill   acetaminophen  (TYLENOL ) 500 MG tablet Take 1-2 tablets (500-1,000 mg total) by mouth every 6 (six) hours as needed. 30 tablet 0   albuterol  (PROVENTIL ) (2.5 MG/3ML) 0.083% nebulizer solution Take 3 mLs (2.5 mg total) by nebulization every 2 (two) hours as needed for wheezing or shortness of breath. 150 mL 5   albuterol  (VENTOLIN  HFA) 108 (90 Base) MCG/ACT inhaler INHALE 2 PUFFS INTO THE LUNGS EVERY 4 HOURS AS NEEDED FOR WHEEZING OR SHORTNESS OF BREATH. 18 each 11   aspirin 81 MG chewable tablet Chew 81 mg by mouth in the morning.     atorvastatin  (LIPITOR) 20 MG tablet TAKE 1 TABLET(20 MG) BY MOUTH DAILY 90 tablet 0   BREZTRI  AEROSPHERE 160-9-4.8 MCG/ACT AERO inhaler INHALE 2 PUFFS INTO THE LUNGS IN THE MORNING AND AT BEDTIME. 10.7 each 3   gabapentin  (NEURONTIN ) 300 MG capsule Take 1 capsule (300 mg total) by mouth at bedtime. (Patient not taking: Reported on 10/27/2024) 30 capsule 3   levocetirizine (XYZAL ) 5 MG tablet Take 1 tablet (5 mg total) by mouth every evening. 30 tablet 11   montelukast  (SINGULAIR ) 10 MG tablet Take 1 tablet (10 mg total) by mouth at bedtime. 32 tablet 11   mupirocin  ointment (BACTROBAN ) 2 % Place 1 Application into the nose 2 (two) times daily. 22 g 0   Nintedanib (OFEV ) 150 MG CAPS Take 1 capsule (150 mg total) by mouth 2 (two) times daily. (Patient not taking: Reported on 10/27/2024) 180 capsule 1   omeprazole  (PRILOSEC) 20 MG capsule Take 1 capsule (20 mg total) by mouth daily. 90 capsule 3   Spacer/Aero-Holding Chambers (AEROCHAMBER MV) inhaler Use as  instructed (Patient taking differently: 1 each by Other route as directed. Use as instructed) 1 each 0   triamcinolone  cream (KENALOG ) 0.1 % APPLY TO AFFECTED AREA TWICE A DAY 30 g 0   No facility-administered medications prior to visit.    No Known Allergies  ROS    See HPI Objective:    General: No acute distress. Awake and conversant.  Eyes: Normal conjunctiva, anicteric. Round symmetric pupils.  ENT: Hearing grossly intact. No nasal discharge.  Neck: Neck is supple. No masses  or thyromegaly.  Respiratory: CTAB. Respirations are non-labored. No wheezing.  Skin: Warm. No rashes or ulcers.  Psych: Alert and oriented. Cooperative, Appropriate mood and affect, Normal judgment.  CV: RRR. No murmur. No lower extremity edema.  MSK: Normal ambulation. No clubbing or cyanosis.  Neuro:  CN II-XII grossly normal.     There were no vitals taken for this visit. Wt Readings from Last 3 Encounters:  10/27/24 235 lb (106.6 kg)  05/26/24 244 lb 12.8 oz (111 kg)  04/15/24 241 lb 9.6 oz (109.6 kg)       Assessment & Plan:   Problem List Items Addressed This Visit     ILD (interstitial lung disease) (HCC) - Primary   Pure hypercholesterolemia    I am having [name] maintain his aspirin, AeroChamber MV, omeprazole , albuterol , acetaminophen , gabapentin , Ofev , triamcinolone  cream, montelukast , levocetirizine, albuterol , Breztri  Aerosphere, atorvastatin , and mupirocin  ointment.  No orders of the defined types were placed in this encounter.

## 2024-10-31 DIAGNOSIS — K219 Gastro-esophageal reflux disease without esophagitis: Secondary | ICD-10-CM | POA: Insufficient documentation

## 2024-11-01 ENCOUNTER — Ambulatory Visit (INDEPENDENT_AMBULATORY_CARE_PROVIDER_SITE_OTHER): Admitting: Student

## 2024-11-01 ENCOUNTER — Encounter: Payer: Self-pay | Admitting: Student

## 2024-11-01 VITALS — BP 129/77 | HR 79 | Ht 74.0 in | Wt 238.6 lb

## 2024-11-01 DIAGNOSIS — E7800 Pure hypercholesterolemia, unspecified: Secondary | ICD-10-CM | POA: Diagnosis not present

## 2024-11-01 DIAGNOSIS — K219 Gastro-esophageal reflux disease without esophagitis: Secondary | ICD-10-CM

## 2024-11-01 DIAGNOSIS — R739 Hyperglycemia, unspecified: Secondary | ICD-10-CM | POA: Diagnosis not present

## 2024-11-01 DIAGNOSIS — J849 Interstitial pulmonary disease, unspecified: Secondary | ICD-10-CM | POA: Diagnosis not present

## 2024-11-01 DIAGNOSIS — Z13220 Encounter for screening for lipoid disorders: Secondary | ICD-10-CM | POA: Diagnosis not present

## 2024-11-01 DIAGNOSIS — G4700 Insomnia, unspecified: Secondary | ICD-10-CM

## 2024-11-01 DIAGNOSIS — Z0000 Encounter for general adult medical examination without abnormal findings: Secondary | ICD-10-CM

## 2024-11-01 MED ORDER — GABAPENTIN 300 MG PO CAPS: 300.0000 mg | ORAL_CAPSULE | Freq: Every day | ORAL | 3 refills | Status: AC

## 2024-11-01 NOTE — Assessment & Plan Note (Addendum)
 H/O VATS 12/2023 with chronic DOE.  Follows with Pulmonology.   Chronic interstitial lung disease with pulmonary fibrosis and COPD. Previous antifibrotic therapy with Ofev  was discontinued due to severe diarrhea pet PT.  - Follow up with pulmonology for further management and potential alternative therapies.

## 2024-11-01 NOTE — Assessment & Plan Note (Signed)
 Tolerating Statin. Encourage heart healthy diet such as MIND or DASH diet, increase exercise, avoid trans fats, simple carbohydrates and processed foods, consider a krill or fish or flaxseed oil cap daily.

## 2024-11-01 NOTE — Assessment & Plan Note (Signed)
 Stable on current medications

## 2024-11-02 ENCOUNTER — Other Ambulatory Visit: Payer: Self-pay | Admitting: Internal Medicine

## 2024-11-02 DIAGNOSIS — J849 Interstitial pulmonary disease, unspecified: Secondary | ICD-10-CM

## 2024-11-02 LAB — COMPREHENSIVE METABOLIC PANEL WITH GFR
ALT: 22 U/L (ref 0–53)
AST: 19 U/L (ref 0–37)
Albumin: 4.2 g/dL (ref 3.5–5.2)
Alkaline Phosphatase: 95 U/L (ref 39–117)
BUN: 4 mg/dL — ABNORMAL LOW (ref 6–23)
CO2: 29 meq/L (ref 19–32)
Calcium: 9.1 mg/dL (ref 8.4–10.5)
Chloride: 98 meq/L (ref 96–112)
Creatinine, Ser: 0.7 mg/dL (ref 0.40–1.50)
GFR: 94.86 mL/min (ref >60.00)
Glucose, Bld: 86 mg/dL (ref 70–99)
Potassium: 4 meq/L (ref 3.5–5.1)
Sodium: 137 meq/L (ref 135–145)
Total Bilirubin: 0.7 mg/dL (ref 0.2–1.2)
Total Protein: 7.2 g/dL (ref 6.0–8.3)

## 2024-11-02 LAB — CBC WITH DIFFERENTIAL/PLATELET
Basophils Absolute: 0.1 K/uL (ref 0.0–0.1)
Basophils Relative: 1.3 % (ref 0.0–3.0)
Eosinophils Absolute: 0.4 K/uL (ref 0.0–0.7)
Eosinophils Relative: 5.4 % — ABNORMAL HIGH (ref 0.0–5.0)
HCT: 44.7 % (ref 39.0–52.0)
Hemoglobin: 15.3 g/dL (ref 13.0–17.0)
Lymphocytes Relative: 16.5 % (ref 12.0–46.0)
Lymphs Abs: 1.2 K/uL (ref 0.7–4.0)
MCHC: 34.2 g/dL (ref 30.0–36.0)
MCV: 99.5 fl (ref 78.0–100.0)
Monocytes Absolute: 0.8 K/uL (ref 0.1–1.0)
Monocytes Relative: 11.4 % (ref 3.0–12.0)
Neutro Abs: 4.7 K/uL (ref 1.4–7.7)
Neutrophils Relative %: 65.4 % (ref 43.0–77.0)
Platelets: 250 K/uL (ref 150.0–400.0)
RBC: 4.49 Mil/uL (ref 4.22–5.81)
RDW: 13.6 % (ref 11.5–15.5)
WBC: 7.1 K/uL (ref 4.0–10.5)

## 2024-11-02 LAB — LIPID PANEL
Cholesterol: 169 mg/dL (ref 0–200)
HDL: 77.8 mg/dL (ref >39.00)
LDL Cholesterol: 70 mg/dL (ref 0–99)
NonHDL: 91.11
Total CHOL/HDL Ratio: 2
Triglycerides: 104 mg/dL (ref 0.0–149.0)
VLDL: 20.8 mg/dL (ref 0.0–40.0)

## 2024-11-02 LAB — HEMOGLOBIN A1C: Hgb A1c MFr Bld: 5.3 % (ref 4.6–6.5)

## 2024-11-02 MED ORDER — BREZTRI AEROSPHERE 160-9-4.8 MCG/ACT IN AERO: 2.0000 | INHALATION_SPRAY | Freq: Two times a day (BID) | RESPIRATORY_TRACT | 3 refills | Status: DC

## 2024-11-02 NOTE — Telephone Encounter (Signed)
 Copied from CRM [id number]. Topic: Clinical - Medication Refill >> Nov 02, 2024  8:52 AM Leila BROCKS wrote: Most Recent Pulmonary Care Visit:  Provider: NP, Almarie Rouleau 02/25/24 and Dr. Dorethia Cave 10/19/23 Department: [facility] Pulmonary Care Date:   Medication: BREZTRI  AEROSPHERE 160-9-4.8 MCG/ACT AERO inhaler   Has the patient contacted their pharmacy? Yes, was advised to contact the office.  (Agent: If no, request that the patient contact the pharmacy for the refill. If patient does not wish to contact the pharmacy document the reason why and proceed with request.) (Agent: If yes, when and what did the pharmacy advise?)  This is the patient's preferred pharmacy:  [id number] - [place], [place] - [address] [place] [address] Phone: [phone] Fax: [phone]  Is this the correct pharmacy for this prescription? Yes If no, delete pharmacy and type the correct one.   Has the prescription been filled recently? No  Is the patient out of the medication? Yes, ran out of medication yesterday and wants refill today, states the pharmacy needs 2 days to order the medication.  Has the patient been seen for an appointment in the last year OR does the patient have an upcoming appointment? Yes, 01/24/25  Can we respond through MyChart? No, please call [phone]  Agent: Please be advised that Rx refills may take up to 3 business days. We ask that you follow-up with your pharmacy.

## 2024-11-03 ENCOUNTER — Ambulatory Visit: Payer: Self-pay | Admitting: Student

## 2024-11-03 LAB — HEPATITIS C ANTIBODY: Hepatitis C Ab: NONREACTIVE

## 2024-11-03 NOTE — Progress Notes (Signed)
Labs stable no new concerns, no changes

## 2024-11-28 ENCOUNTER — Encounter: Payer: Self-pay | Admitting: Internal Medicine

## 2024-12-19 ENCOUNTER — Other Ambulatory Visit: Payer: Self-pay | Admitting: Student

## 2024-12-19 MED ORDER — OMEPRAZOLE 20 MG PO CPDR: 20.0000 mg | DELAYED_RELEASE_CAPSULE | Freq: Every day | ORAL | 1 refills | Status: AC

## 2024-12-19 NOTE — Telephone Encounter (Signed)
 Copied from CRM #[id number]. Topic: Clinical - Medication Refill >> Dec 19, 2024 11:53 AM Corin V wrote: Medication: omeprazole  (PRILOSEC) 20 MG capsule  Has the patient contacted their pharmacy? Yes- no refills remaining (Agent: If no, request that the patient contact the pharmacy for the refill. If patient does not wish to contact the pharmacy document the reason why and proceed with request.) (Agent: If yes, when and what did the pharmacy advise?)  This is the patient's preferred pharmacy:  [id number] - [place], [place] - [address] [place] [address] Phone: [phone] Fax: [phone]  Is this the correct pharmacy for this prescription? Yes If no, delete pharmacy and type the correct one.   Has the prescription been filled recently? No  Is the patient out of the medication? No  Has the patient been seen for an appointment in the last year OR does the patient have an upcoming appointment? Yes  Can we respond through MyChart? No  Agent: Please be advised that Rx refills may take up to 3 business days. We ask that you follow-up with your pharmacy.

## 2024-12-29 ENCOUNTER — Other Ambulatory Visit: Payer: Self-pay | Admitting: Internal Medicine

## 2024-12-29 DIAGNOSIS — J849 Interstitial pulmonary disease, unspecified: Secondary | ICD-10-CM

## 2024-12-29 NOTE — Telephone Encounter (Signed)
 Copied from CRM #[id number]. Topic: Clinical - Medication Refill >> Dec 29, 2024 10:27 AM Rilla B wrote: Medication: budesonide -glycopyrrolate-formoterol  (BREZTRI  AEROSPHERE) 160-9-4.8 MCG/ACT AERO inhaler  Has the patient contacted their pharmacy? Yes (Agent: If no, request that the patient contact the pharmacy for the refill. If patient does not wish to contact the pharmacy document the reason why and proceed with request.) (Agent: If yes, when and what did the pharmacy advise?)  This is the patient's preferred pharmacy:   [id number] - [place], [place] - [address] [place] [address] Phone: [phone] Fax: [phone]  Is this the correct pharmacy for this prescription? Yes If no, delete pharmacy and type the correct one.   Has the prescription been filled recently? Yes  Is the patient out of the medication? No  Has the patient been seen for an appointment in the last year OR does the patient have an upcoming appointment? Yes  Can we respond through MyChart? No  Agent: Please be advised that Rx refills may take up to 3 business days. We ask that you follow-up with your pharmacy.

## 2024-12-30 MED ORDER — BREZTRI AEROSPHERE 160-9-4.8 MCG/ACT IN AERO: 2.0000 | INHALATION_SPRAY | Freq: Two times a day (BID) | RESPIRATORY_TRACT | 2 refills | Status: AC

## 2024-12-30 NOTE — Telephone Encounter (Signed)
 Pt will need an appt for further refills. LOV 02/25/24

## 2025-01-18 ENCOUNTER — Ambulatory Visit (INDEPENDENT_AMBULATORY_CARE_PROVIDER_SITE_OTHER): Admitting: Internal Medicine

## 2025-01-18 ENCOUNTER — Encounter: Payer: Self-pay | Admitting: Internal Medicine

## 2025-01-18 VITALS — BP 112/84 | HR 104 | Ht 74.0 in | Wt 242.0 lb

## 2025-01-18 DIAGNOSIS — Z9109 Other allergy status, other than to drugs and biological substances: Secondary | ICD-10-CM

## 2025-01-18 DIAGNOSIS — J849 Interstitial pulmonary disease, unspecified: Secondary | ICD-10-CM

## 2025-01-18 DIAGNOSIS — J84112 Idiopathic pulmonary fibrosis: Secondary | ICD-10-CM | POA: Diagnosis not present

## 2025-01-18 NOTE — Patient Instructions (Addendum)
"    ICD-10-CM   1. IPF (idiopathic pulmonary fibrosis) (HCC)  J84.112     2. ILD (interstitial lung disease) (HCC)  J84.9     3. Environmental allergies  Z91.09       IPF (idiopathic pulmonary fibrosis) (HCC)   Stable but noted prior intolerance to nintedanib and based on that reluctance to try pirfenidone or the newly approved Nerandomilast  Plan  - Consider clinical trials as a care option  - We discussed extensively about this and you might be interested - Take consent form for a study called MEDIAR/WISPER study and reviewed that  - Given to you on 01/18/2025 and research team will call and update you -Do spirometry and DLCO in 6 months open  - Cancel pft if in a research visit  Environmental allergies   - stble  Plan - Continue Singulair  as before = Continue Breztri  as before  FOLLOWUP  - 6 months 15-minute visit  "

## 2025-01-18 NOTE — Progress Notes (Addendum)
 "       11/24/2022:    69 year old male, never smoker followed for severe obstructive lung disease with longstanding history of asthma and IPF.  He is a patient of [name] and last seen in office 10/19/2023.  Past medical history significant for CAD, HLD, environmental allergies.  OV with [name].  Follow-up regarding severe obstructive disease with longstanding history of asthma.  Has been using Breztri  regularly.  Had a recent CT scan of the chest that showed some peripheral interstitial changes. 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  Possible underlying interstitial lung disease.  Will have him follow-up with [name] in the ILD clinic.  Provided with ILD questionnaire.  Plan to repeat PFTs and obtain HRCT in 6 months.  02/23/2023: OV with [name] for follow-up after 6-minute walk test, ordered by [name].  He also had overnight oximetry, which hhe completed last night and returned this morning.  He did not have any desaturations on his with SpO2 low of 94%.  ONO results have yet to be received.  He tells me today that he is feeling relatively the same as when he was here last.  Still has shortness of breath with exertion, including ADLs and walking on a flat surface at his own speed.  He has a daily, productive cough with clear sputum.  No increased chest congestion or wheezing.  He is using his Breztri  inhaler, usually 4 times a day.  Does not use his albuterol  rescue inhaler much.  He does feel like he benefits from use of the Breztri .  No recent hospitalizations.  Has not required any steroids or antibiotics in the last 6 months.  06/23/2023: OV with [name].  Referred to ILD clinic.  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  Moved to [place] after this.  Currently retired.  Told that he had pulmonary lung disease which resulted in a CT scan of his chest  that showed fibrosis.  Symptoms are progressive.  Since his last visit no significant change.  Has an insidious onset of shortness of breath gradually getting worse for the last 3 years.  Has had asthma his whole life.  Cough is worse at night.  Does produce some yellow phlegm.  He is on inhaler for this.  Does clear his throat. Concern for ILD.  Serologies obtained and negative.  Walk test without desaturations. ONO ordered on room air.  Plan to repeat spirometry/DLCO in 2 to 3 months.  RAST allergy panel also ordered for further evaluation   [facility] Comprehensive ILD Questionnaire    Past Medical History :  -He states he was born with asthma.  He is also suffers from obesity - Denies any collagen vascular disease or connective tissue disease or vasculitis - Denies any heart disease Denies pulmonary hypertension diabetes thyroid disease.  Denies any strokes denies kidney disease. -Never had COVID-vaccine.  Never had COVID disease.     ROS:  Detailed review of systems is negative for any symptoms other than shortness of breath.   FAMILY HISTORY of LUNG DISEASE:  -Asthma present to the mother but otherwise detailed family history for pulmonary disease including connective tissue disease and premature graying of the head is negative.   PERSONAL EXPOSURE HISTORY:  -Never smoked any cigarettes.  He has done some passive smoking.  He did smoke some marijuana 1974  very little.  No cocaine no intravenous drug use   HOME  EXPOSURE and HOBBY DETAILS :  -[place] last [place] years.  Home was built in [place].  There is mold/mildew in the main bathroom on the ceiling and sometimes uses bleach to get rid of it.  He did have a water leak under the house in 2022 but it dried out.  Otherwise detail organic antigen exposure history in the house is negative   OCCUPATIONAL HISTORY (122 questions) : -He is retired.  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  He has been exposed to diabetic condition spaces.  Exposed to warehouses.  Has done some woodwork.  Exposed to asbestos.  Exposed to metal grinding   PULMONARY TOXICITY HISTORY (27 items):   = Detail organic antigen exposure history in the house is negative.  No medications for pulmonary eosinophilia   INVESTIGATIONS: x    10/19/2023: OV with [name].  Reviewed at ILD conference 08/04/2023.  Consensus was to undergo biopsy if serologies were negative.  Reviewed results at follow-up.  Serologies were negative.  CT is read as indeterminate for UIP.  Could be IPF versus HP versus NSIP.  Discussed getting a definitive biopsy with surgical lung biopsy.  Does have elevated peripheral eosinophils and significant positives on his RAST panel.  Referral to allergist placed.  Advised to continue Breztri  for asthma management.  Referred to cardiothoracic surgery.   02/25/2024: Today-follow-up Patient presents today for overdue follow-up with his sister-in-law, who is a former engineer, civil (consulting).  Since his last visit he had surgical lung biopsy with Dr. Kerrin 01/11/2024.  Biopsy revealed chronic interstitial fibroinflammatory process, most consistent with UIP.  No granulomas were present.  He has done relatively well since his surgery aside from persistent right sided pain, especially with coughing.  He has been taking tramadol  for this.  He is almost out of this.  Otherwise, his breathing has been stable.  He does get short winded walking to the mailbox and back.  Does have an occasional cough with white to yellow phlegm, which is baseline for him.  Has not noticed any wheezing, fevers, chills, night sweats.  No issues with lower extremity swelling, orthopnea, PND.  He is on any supplemental oxygen .  Previous ONO on room air was ordered but I do not see any results for this documented in the chart.  He also had allergy workup with Dr.  Marinda in January 2025.  Has multiple environmental allergies.  She recommended immunotherapy as well as possible biologic therapy for his asthma management.  He has not been started on either 1 of these.  Does have 2 dogs at the house and had positive RAST to dog dander.  At the time, he did not really want to consider immunotherapy but was open to injectable medicines for asthma management.  Has not been started on anything at this point.  Still using his Breztri  twice a day.  He does use his albuterol  a couple times a day as well, which has been normal for him for quite some time now.  Does feel like this helps.  Has not had any steroids or antibiotics recently.  He is taking Xyzal  and Singulair  daily.  TEST/EVENTS:  01/07/2023 echo: EF 60-65%. LVH. GIDD. RV size and function nl. 01/15/2023 PFT: FVC 46, FEV1 40, ratio 67, TLC 75, DLCOcor 59.  No BD.  Mixed severe restriction and obstruction with diffusion defect 02/23/2023 : no desaturations on room air 05/28/2023 HRCT chest: atherosclerosis. Widespread chronic changes categorized as indeterminate for UIP. Mild air trapping. Mild tracheobronchomalacia. Hepatic steatosis.  06/01/2023 PFT: FVC 55, FEV1 51, ratio 69, DLCOcor 72 09/23/2023 PFT: FVC 53, FEV1 50, ratio 70, DLCOcor 60 06/2023 serologies negative 12/25/2023 positive RAST to multiple environmental allergens; IgE 185; eos 400 01/01/2024 HRCT chest: stable aortic dilation. Atherosclerosis. Pleuroparenchymal scarring in posterior lower right hemithorax. Superimposed mild subpleural reticular densities and ground glass in later and posterior aspects of mid and lower lung zones, as on 05/28/2023. Probable minimal bronchiolectasis. Calcified granulomas. Air trapping. Probable UIP.  01/11/2024 lung biopsy >> chronic interstitial fibroinflammatory process, UIP.  Spatial and temporal heterogeneity.  No granulomas or organizing pneumonia  OV 01/18/2025  Subjective:  Patient ID: Mark Doyle, male , DOB:  12/06/1956 , age 30 y.o. , MRN: [id number] , ADDRESS: [address] [place] [id number] PCP Wheeler Harlene CROME, NP Patient Care Team: Wheeler Harlene CROME, NP as PCP - General (Nurse Practitioner) Steva, [facility]  This Provider for this visit: Treatment Team:  Attending Provider: Geronimo Amel, MD    01/18/2025 -   Chief Complaint  Patient presents with   Medical Management of Chronic Issues   Interstitial Lung Disease    Breathing has been stable.      HPI Mark Doyle 69 y.o. -IPF along with positive RAST allergies [IgE 185 mucinosis 400] follow-up    He is not been seen by the practice in 10 months.  I personally saw him over a year ago.  In March 2025 Katie [name]'s nurse practitioner started him on nintedanib but he quickly had diarrhea.  So he gave up on it.  They recommended pirfenidone at that time and he did not want to do it because of fear of side effects.  I discussed pirfenidone as an alternative at this visit and he still does not want to do it.  We discussed the new drug Nerandomilast and he is also does not want to do it even though the side effect profile is much better.  In the interim Interim Health status: No new complaints No new medical problems. No new surgeries. No ER visits. No Urgent care visits. No changes to medications.  We therefore discussed clinical research as a care option and it is documented below.  Based on this he is preliminarily interested.  Talked about IV research protocol called me WISPER study  In terms of his allergies he takes Singulair  and Breztri  and this is stable.  He has not had PFTs or CT scans in a while.    1. Scientific Purpose  Clinical research is designed to produce generalizable knowledge and to answer questions about the safety and efficacy of intervention(s) under study in order to determine whether or not they may be useful for the care of future patients.  2. Study  Procedures  Participation in a trial may involve procedures or tests, in addition to the intervention(s) under study, that are intended only or primarily to generate scientific knowledge and that are otherwise not necessary for patient care.   3. Uncertainty  For intervention(s) under study in clinical research, there often is less knowledge and more uncertainty about the risks and benefits to a population of trial participants than there is when a doctor offers a patient standard interventions.   4. Adherence to Protocol  Administration of the intervention(s) under  study is typically based on a strict protocol with defined dose, scheduling, and use or avoidance of concurrent medications, compared to administration of standard interventions.  5. Clinician as Investigator  Clinicians who are in health care settings provide treatment; in a clinical trial setting, they are also investigating safety and efficacy of an intervention. In otherwise your doctor or nurse practitioner can be wearing 2 hats - one as care giver another as oceanographer  6. Patient as Engineer, Agricultural Subject  Patients participating in research trials are research subjects or volunteers. In other words participating in research is 100% voluntary and at one's own free weill. The decision to participate or not participate will NOT affect patient care and the doctor-patient relationship in any way    SYMPTOM SCALE - ILD 06/24/2023 10/19/2023   01/18/2025   Current weight       O2 use ra ra ra  Shortness of Breath 0 -> 5 scale with 5 being worst (score 6 If unable to do)     At rest 3 1 1   Simple tasks - showers, clothes change, eating, shaving 3 3 1   Household (dishes, doing bed, laundry) 3 3 2   Shopping 3 4 2   Walking level at own pace 4 4 4   Walking up Stairs 5 5 5   Total (30-36) Dyspnea Score 21 20 15      Non-dyspnea symptoms (0-> 5 scale) 06/24/2023 10/19/2023   01/18/2025   How bad is your cough? 4  3 3   How bad is your fatigue 4 3 2   How bad is nausea 0 0 0  How bad is vomiting?   0 0 0  How bad is diarrhea? 0 0 00  How bad is anxiety? 4 0 0  How bad is depression 3 0 0  Any chronic pain - if so where and how bad x 0 0      Simple office walk 224 (66+46 x 2) feet Pod A at [facility] x  3 laps goal with forehead probe 01/18/2025    O2 used ra   Number laps completed 2 of 3   Comments about pace avg   Resting Pulse Ox/HR 95% and 105/min   Final Pulse Ox/HR 94% and 112/min   Desaturated </= 88% no   Desaturated <= 3% points no   Got Tachycardic >/= 90/min yes   Symptoms at end of test dyspneic   Miscellaneous comments x         PFT     Latest Ref Rng & Units 09/23/2023    9:59 AM 06/01/2023    2:42 PM 01/15/2023   10:42 AM 10/02/2021    9:14 AM  PFT Results  FVC-Pre L 2.80  2.94  2.38  2.10   FVC-Predicted Pre % 53  55  46  40   FVC-Post L   2.35  2.28   FVC-Predicted Post %   46  44   Pre FEV1/FVC % % 70  69  65  58   Post FEV1/FCV % %   67  60   FEV1-Pre L 1.97  2.02  1.54  1.22   FEV1-Predicted Pre % 50  51  40  31   FEV1-Post L   1.58  1.38   DLCO uncorrected ml/min/mmHg 18.09  21.73  17.32  17.43   DLCO UNC% % 60  72  59  59   DLCO corrected ml/min/mmHg 18.09  21.73  17.27  17.43   DLCO COR %  Predicted % 60  72  59  59   DLVA Predicted % 83  99  93  97   TLC L   5.72  5.84   TLC % Predicted %   75  76   RV % Predicted %   111  132        LAB RESULTS last 96 hours No results found.       has a past medical history of Asthma, COPD (chronic obstructive pulmonary disease) (HCC), Coronary artery disease, Dyslipidemia, Dyspnea, Environmental allergies, GERD (gastroesophageal reflux disease), Hypertension, ILD (interstitial lung disease) (HCC), and Macular degeneration.   reports that he has never smoked. His smokeless tobacco use includes chew.  Past Surgical History:  Procedure Laterality Date   CHOLECYSTECTOMY     LUNG BIOPSY Right 01/11/2024    Procedure: LUNG BIOPSY;  Surgeon: Kerrin Elspeth BROCKS, MD;  Location: [facility] OR;  Service: Thoracic;  Laterality: Right;   MULTIPLE TOOTH EXTRACTIONS     all upper with anesthesia   SPINE SURGERY     TONSILLECTOMY     VIDEO ASSISTED THORACOSCOPY Right 01/11/2024   Procedure: VIDEO ASSISTED THORACOSCOPY;  Surgeon: Kerrin Elspeth BROCKS, MD;  Location[facility] OR;  Service: Thoracic;  Laterality: Right;    Allergies[1]  Immunization History  Administered Date(s) Administered   Zoster, Live 09/21/2014    Family History  Problem Relation Age of Onset   Asthma Mother    Heart disease Father    Stroke Father     Current Medications[2]      Objective:   Vitals:   01/18/25 1314  BP: 112/84  Pulse: (!) 104  SpO2: 95%  Weight: 242 lb (109.8 kg)  Height: 6' 2 (1.88 m)    Estimated body mass index is 31.07 kg/m as calculated from the following:   Height as of this encounter: 6' 2 (1.88 m).   Weight as of this encounter: 242 lb (109.8 kg).  @WEIGHTCHANGE @  Filed Weights   01/18/25 1314  Weight: 242 lb (109.8 kg)     Physical Exam   General: No distress. Looks stable O2 at rest: no Cane present: no Sitting in wheel chair: no Frail: no Obese: YES Neuro: Alert and Oriented x 3. GCS 15. Speech normal Psych: Pleasant Resp:  Barrel Chest - no.  Wheeze - no, Crackles - YES BASE, No overt respiratory distress CVS: Normal heart sounds. Murmurs - no Ext: Stigmata of Connective Tissue Disease - no HEENT: Normal upper airway. PEERL +. No post nasal drip        Assessment/     Assessment & Plan IPF (idiopathic pulmonary fibrosis) (HCC)  Environmental allergies    PLAN Patient Instructions     ICD-10-CM   1. IPF (idiopathic pulmonary fibrosis) (HCC)  J84.112     2. ILD (interstitial lung disease) (HCC)  J84.9     3. Environmental allergies  Z91.09       IPF (idiopathic pulmonary fibrosis) (HCC)   Stable but noted prior intolerance to nintedanib and based  on that reluctance to try pirfenidone or the newly approved Nerandomilast  Plan  - Consider clinical trials as a care option  - We discussed extensively about this and you might be interested - Take consent form for a study called MEDIAR/WISPER study and reviewed that  - Given to you on 01/18/2025 and research team will call and update you -Do spirometry and DLCO in 6 months open  - Cancel pft if in a research visit  Environmental allergies   - stble  Plan - Continue Singulair  as before = Continue Breztri  as before  FOLLOWUP  - 6 months 15-minute visit     FOLLOWUP    Return in about 6 months (around 07/18/2025) for 15 min visit, after Spiro and DLCO, with Dr Geronimo.    SIGNATURE    Dr. Dorethia Geronimo, M.D., F.C.C.P,  Pulmonary and Critical Care Medicine Staff Physician, [facility] Center Director - Interstitial Lung Disease  Program  Pulmonary Fibrosis [facility] at [facility] [place], [place], [address]  Pager: [phone], If no answer or between  15:00h - 7:00h: call [phone] Telephone: [phone]  1:53 PM 01/18/2025     [1] No Known Allergies [2]  Current Outpatient Medications:    acetaminophen  (TYLENOL ) 500 MG tablet, Take 1-2 tablets (500-1,000 mg total) by mouth every 6 (six) hours as needed., Disp: 30 tablet, Rfl: 0   albuterol  (PROVENTIL ) (2.5 MG/3ML) 0.083% nebulizer solution, Take 3 mLs (2.5 mg total) by nebulization every 2 (two) hours as needed for wheezing or shortness of breath., Disp: 150 mL, Rfl: 5   albuterol  (VENTOLIN  HFA) 108 (90 Base) MCG/ACT inhaler, INHALE 2 PUFFS INTO THE LUNGS EVERY 4 HOURS AS NEEDED FOR WHEEZING OR SHORTNESS OF BREATH., Disp: 18 each, Rfl: 11   aspirin 81 MG chewable tablet, Chew 81 mg by mouth in the morning., Disp: , Rfl:    atorvastatin  (LIPITOR) 20 MG tablet, Take 1 tablet (20 mg total) by mouth daily., Disp: 90 tablet, Rfl: 1   budesonide -glycopyrrolate-formoterol   (BREZTRI  AEROSPHERE) 160-9-4.8 MCG/ACT AERO inhaler, Inhale 2 puffs into the lungs in the morning and at bedtime., Disp: 10.7 each, Rfl: 2   gabapentin  (NEURONTIN ) 300 MG capsule, Take 1 capsule (300 mg total) by mouth at bedtime., Disp: 30 capsule, Rfl: 3   levocetirizine (XYZAL ) 5 MG tablet, Take 1 tablet (5 mg total) by mouth every evening., Disp: 30 tablet, Rfl: 11   montelukast  (SINGULAIR ) 10 MG tablet, Take 1 tablet (10 mg total) by mouth at bedtime., Disp: 32 tablet, Rfl: 11   mupirocin  ointment (BACTROBAN ) 2 %, Place 1 Application into the nose 2 (two) times daily., Disp: 22 g, Rfl: 0   omeprazole  (PRILOSEC) 20 MG capsule, Take 1 capsule (20 mg total) by mouth daily., Disp: 90 capsule, Rfl: 1   Spacer/Aero-Holding Chambers (AEROCHAMBER MV) inhaler, Use as instructed (Patient taking differently: 1 each by Other route as directed. Use as instructed), Disp: 1 each, Rfl: 0   triamcinolone  cream (KENALOG ) 0.1 %, APPLY TO AFFECTED AREA TWICE A DAY, Disp: 30 g, Rfl: 0  "

## 2025-01-24 ENCOUNTER — Ambulatory Visit: Admitting: Internal Medicine

## 2025-05-02 ENCOUNTER — Ambulatory Visit: Admitting: Student

## 2025-11-01 ENCOUNTER — Ambulatory Visit
